# Patient Record
Sex: Female | Born: 1975 | Race: White | Hispanic: No | Marital: Married | Smoking: Current every day smoker
Health system: Southern US, Community
[De-identification: ages and names within clinical notes are randomized; demographics above are authoritative.]

## PROBLEM LIST (undated history)

## (undated) ENCOUNTER — Inpatient Hospital Stay (HOSPITAL_COMMUNITY): Payer: Self-pay

## (undated) DIAGNOSIS — F32A Depression, unspecified: Secondary | ICD-10-CM

## (undated) DIAGNOSIS — G8929 Other chronic pain: Secondary | ICD-10-CM

## (undated) DIAGNOSIS — G43909 Migraine, unspecified, not intractable, without status migrainosus: Secondary | ICD-10-CM

## (undated) DIAGNOSIS — R109 Unspecified abdominal pain: Secondary | ICD-10-CM

## (undated) DIAGNOSIS — F329 Major depressive disorder, single episode, unspecified: Secondary | ICD-10-CM

## (undated) DIAGNOSIS — E785 Hyperlipidemia, unspecified: Secondary | ICD-10-CM

## (undated) DIAGNOSIS — K219 Gastro-esophageal reflux disease without esophagitis: Secondary | ICD-10-CM

## (undated) DIAGNOSIS — I200 Unstable angina: Secondary | ICD-10-CM

## (undated) DIAGNOSIS — I10 Essential (primary) hypertension: Secondary | ICD-10-CM

## (undated) HISTORY — PX: WISDOM TOOTH EXTRACTION: SHX21

## (undated) HISTORY — PX: CHOLECYSTECTOMY: SHX55

---

## 2001-10-29 ENCOUNTER — Inpatient Hospital Stay (HOSPITAL_COMMUNITY): Admission: EM | Admit: 2001-10-29 | Discharge: 2001-11-02 | Payer: Self-pay | Admitting: Psychiatry

## 2002-10-19 ENCOUNTER — Emergency Department (HOSPITAL_COMMUNITY): Admission: EM | Admit: 2002-10-19 | Discharge: 2002-10-19 | Payer: Self-pay | Admitting: Emergency Medicine

## 2002-11-17 ENCOUNTER — Emergency Department (HOSPITAL_COMMUNITY): Admission: EM | Admit: 2002-11-17 | Discharge: 2002-11-17 | Payer: Self-pay | Admitting: Emergency Medicine

## 2002-11-17 ENCOUNTER — Encounter: Payer: Self-pay | Admitting: Emergency Medicine

## 2002-12-08 ENCOUNTER — Emergency Department (HOSPITAL_COMMUNITY): Admission: EM | Admit: 2002-12-08 | Discharge: 2002-12-08 | Payer: Self-pay | Admitting: Emergency Medicine

## 2002-12-08 ENCOUNTER — Encounter: Payer: Self-pay | Admitting: Emergency Medicine

## 2003-01-01 ENCOUNTER — Emergency Department (HOSPITAL_COMMUNITY): Admission: EM | Admit: 2003-01-01 | Discharge: 2003-01-02 | Payer: Self-pay

## 2003-01-02 ENCOUNTER — Emergency Department (HOSPITAL_COMMUNITY): Admission: EM | Admit: 2003-01-02 | Discharge: 2003-01-02 | Payer: Self-pay

## 2003-01-05 ENCOUNTER — Emergency Department (HOSPITAL_COMMUNITY): Admission: EM | Admit: 2003-01-05 | Discharge: 2003-01-06 | Payer: Self-pay | Admitting: Emergency Medicine

## 2003-01-09 ENCOUNTER — Emergency Department (HOSPITAL_COMMUNITY): Admission: EM | Admit: 2003-01-09 | Discharge: 2003-01-09 | Payer: Self-pay | Admitting: Emergency Medicine

## 2003-01-31 ENCOUNTER — Emergency Department (HOSPITAL_COMMUNITY): Admission: EM | Admit: 2003-01-31 | Discharge: 2003-01-31 | Payer: Self-pay | Admitting: Emergency Medicine

## 2003-03-01 ENCOUNTER — Emergency Department (HOSPITAL_COMMUNITY): Admission: EM | Admit: 2003-03-01 | Discharge: 2003-03-01 | Payer: Self-pay | Admitting: Emergency Medicine

## 2003-03-01 ENCOUNTER — Emergency Department (HOSPITAL_COMMUNITY): Admission: EM | Admit: 2003-03-01 | Discharge: 2003-03-02 | Payer: Self-pay | Admitting: Emergency Medicine

## 2003-03-01 ENCOUNTER — Encounter: Payer: Self-pay | Admitting: Emergency Medicine

## 2003-03-03 ENCOUNTER — Emergency Department (HOSPITAL_COMMUNITY): Admission: EM | Admit: 2003-03-03 | Discharge: 2003-03-03 | Payer: Self-pay | Admitting: Emergency Medicine

## 2003-07-03 ENCOUNTER — Emergency Department (HOSPITAL_COMMUNITY): Admission: EM | Admit: 2003-07-03 | Discharge: 2003-07-04 | Payer: Self-pay | Admitting: Emergency Medicine

## 2003-07-15 ENCOUNTER — Encounter: Payer: Self-pay | Admitting: Emergency Medicine

## 2003-07-15 ENCOUNTER — Emergency Department (HOSPITAL_COMMUNITY): Admission: EM | Admit: 2003-07-15 | Discharge: 2003-07-15 | Payer: Self-pay | Admitting: Emergency Medicine

## 2003-10-15 ENCOUNTER — Inpatient Hospital Stay (HOSPITAL_COMMUNITY): Admission: AD | Admit: 2003-10-15 | Discharge: 2003-10-15 | Payer: Self-pay | Admitting: *Deleted

## 2003-10-16 ENCOUNTER — Emergency Department (HOSPITAL_COMMUNITY): Admission: EM | Admit: 2003-10-16 | Discharge: 2003-10-16 | Payer: Self-pay

## 2003-10-17 ENCOUNTER — Other Ambulatory Visit: Admission: RE | Admit: 2003-10-17 | Discharge: 2003-10-17 | Payer: Self-pay | Admitting: *Deleted

## 2003-10-17 ENCOUNTER — Observation Stay (HOSPITAL_COMMUNITY): Admission: AD | Admit: 2003-10-17 | Discharge: 2003-10-18 | Payer: Self-pay | Admitting: General Surgery

## 2003-11-25 ENCOUNTER — Emergency Department (HOSPITAL_COMMUNITY): Admission: EM | Admit: 2003-11-25 | Discharge: 2003-11-25 | Payer: Self-pay | Admitting: Emergency Medicine

## 2003-12-04 ENCOUNTER — Emergency Department (HOSPITAL_COMMUNITY): Admission: EM | Admit: 2003-12-04 | Discharge: 2003-12-04 | Payer: Self-pay | Admitting: Emergency Medicine

## 2004-05-12 ENCOUNTER — Emergency Department (HOSPITAL_COMMUNITY): Admission: EM | Admit: 2004-05-12 | Discharge: 2004-05-12 | Payer: Self-pay | Admitting: Emergency Medicine

## 2004-06-08 ENCOUNTER — Emergency Department (HOSPITAL_COMMUNITY): Admission: EM | Admit: 2004-06-08 | Discharge: 2004-06-09 | Payer: Self-pay | Admitting: Emergency Medicine

## 2004-10-26 ENCOUNTER — Emergency Department (HOSPITAL_COMMUNITY): Admission: EM | Admit: 2004-10-26 | Discharge: 2004-10-26 | Payer: Self-pay | Admitting: Emergency Medicine

## 2005-01-01 ENCOUNTER — Encounter: Admission: RE | Admit: 2005-01-01 | Discharge: 2005-01-01 | Payer: Self-pay | Admitting: Obstetrics and Gynecology

## 2006-01-23 ENCOUNTER — Emergency Department (HOSPITAL_COMMUNITY): Admission: EM | Admit: 2006-01-23 | Discharge: 2006-01-23 | Payer: Self-pay | Admitting: Emergency Medicine

## 2006-04-04 ENCOUNTER — Emergency Department (HOSPITAL_COMMUNITY): Admission: EM | Admit: 2006-04-04 | Discharge: 2006-04-04 | Payer: Self-pay | Admitting: Emergency Medicine

## 2006-04-23 ENCOUNTER — Inpatient Hospital Stay (HOSPITAL_COMMUNITY): Admission: AD | Admit: 2006-04-23 | Discharge: 2006-05-02 | Payer: Self-pay | Admitting: Psychiatry

## 2006-04-23 ENCOUNTER — Ambulatory Visit: Payer: Self-pay | Admitting: Psychiatry

## 2006-04-30 ENCOUNTER — Encounter: Payer: Self-pay | Admitting: *Deleted

## 2006-05-02 ENCOUNTER — Emergency Department (HOSPITAL_COMMUNITY): Admission: EM | Admit: 2006-05-02 | Discharge: 2006-05-03 | Payer: Self-pay | Admitting: Emergency Medicine

## 2006-05-08 ENCOUNTER — Emergency Department (HOSPITAL_COMMUNITY): Admission: EM | Admit: 2006-05-08 | Discharge: 2006-05-08 | Payer: Self-pay | Admitting: Emergency Medicine

## 2006-05-15 ENCOUNTER — Inpatient Hospital Stay (HOSPITAL_COMMUNITY): Admission: AD | Admit: 2006-05-15 | Discharge: 2006-05-18 | Payer: Self-pay | Admitting: Psychiatry

## 2006-05-19 ENCOUNTER — Emergency Department (HOSPITAL_COMMUNITY): Admission: EM | Admit: 2006-05-19 | Discharge: 2006-05-19 | Payer: Self-pay | Admitting: Emergency Medicine

## 2006-05-29 ENCOUNTER — Emergency Department (HOSPITAL_COMMUNITY): Admission: EM | Admit: 2006-05-29 | Discharge: 2006-05-29 | Payer: Self-pay | Admitting: Family Medicine

## 2006-06-01 ENCOUNTER — Ambulatory Visit: Payer: Self-pay | Admitting: Nurse Practitioner

## 2006-06-06 ENCOUNTER — Ambulatory Visit: Payer: Self-pay | Admitting: *Deleted

## 2006-06-23 ENCOUNTER — Emergency Department (HOSPITAL_COMMUNITY): Admission: EM | Admit: 2006-06-23 | Discharge: 2006-06-23 | Payer: Self-pay | Admitting: Family Medicine

## 2006-06-27 ENCOUNTER — Emergency Department (HOSPITAL_COMMUNITY): Admission: EM | Admit: 2006-06-27 | Discharge: 2006-06-28 | Payer: Self-pay | Admitting: Emergency Medicine

## 2006-06-28 ENCOUNTER — Emergency Department (HOSPITAL_COMMUNITY): Admission: EM | Admit: 2006-06-28 | Discharge: 2006-06-29 | Payer: Self-pay | Admitting: Emergency Medicine

## 2006-07-01 ENCOUNTER — Emergency Department (HOSPITAL_COMMUNITY): Admission: EM | Admit: 2006-07-01 | Discharge: 2006-07-01 | Payer: Self-pay | Admitting: Emergency Medicine

## 2006-07-08 ENCOUNTER — Emergency Department (HOSPITAL_COMMUNITY): Admission: EM | Admit: 2006-07-08 | Discharge: 2006-07-08 | Payer: Self-pay | Admitting: *Deleted

## 2006-07-29 ENCOUNTER — Emergency Department (HOSPITAL_COMMUNITY): Admission: EM | Admit: 2006-07-29 | Discharge: 2006-07-29 | Payer: Self-pay | Admitting: Emergency Medicine

## 2006-08-03 ENCOUNTER — Ambulatory Visit: Payer: Self-pay | Admitting: Nurse Practitioner

## 2006-08-23 ENCOUNTER — Ambulatory Visit: Payer: Self-pay | Admitting: Nurse Practitioner

## 2006-09-10 ENCOUNTER — Emergency Department (HOSPITAL_COMMUNITY): Admission: EM | Admit: 2006-09-10 | Discharge: 2006-09-10 | Payer: Self-pay | Admitting: Emergency Medicine

## 2006-11-06 ENCOUNTER — Emergency Department (HOSPITAL_COMMUNITY): Admission: EM | Admit: 2006-11-06 | Discharge: 2006-11-06 | Payer: Self-pay | Admitting: Emergency Medicine

## 2006-11-09 ENCOUNTER — Emergency Department (HOSPITAL_COMMUNITY): Admission: EM | Admit: 2006-11-09 | Discharge: 2006-11-09 | Payer: Self-pay | Admitting: Emergency Medicine

## 2006-11-22 ENCOUNTER — Ambulatory Visit: Payer: Self-pay | Admitting: Family Medicine

## 2006-12-20 ENCOUNTER — Ambulatory Visit: Payer: Self-pay | Admitting: Family Medicine

## 2007-01-06 ENCOUNTER — Ambulatory Visit: Payer: Self-pay | Admitting: Family Medicine

## 2007-03-02 ENCOUNTER — Ambulatory Visit: Payer: Self-pay | Admitting: Family Medicine

## 2007-04-17 ENCOUNTER — Emergency Department (HOSPITAL_COMMUNITY): Admission: EM | Admit: 2007-04-17 | Discharge: 2007-04-17 | Payer: Self-pay | Admitting: Emergency Medicine

## 2007-06-07 ENCOUNTER — Encounter (INDEPENDENT_AMBULATORY_CARE_PROVIDER_SITE_OTHER): Payer: Self-pay | Admitting: *Deleted

## 2007-06-22 ENCOUNTER — Ambulatory Visit: Payer: Self-pay | Admitting: Family Medicine

## 2007-06-25 ENCOUNTER — Emergency Department (HOSPITAL_COMMUNITY): Admission: EM | Admit: 2007-06-25 | Discharge: 2007-06-25 | Payer: Self-pay | Admitting: Emergency Medicine

## 2007-06-30 ENCOUNTER — Emergency Department: Payer: Self-pay | Admitting: Emergency Medicine

## 2007-08-19 ENCOUNTER — Other Ambulatory Visit: Payer: Self-pay

## 2007-08-19 ENCOUNTER — Emergency Department: Payer: Self-pay | Admitting: Emergency Medicine

## 2007-09-02 ENCOUNTER — Emergency Department (HOSPITAL_COMMUNITY): Admission: EM | Admit: 2007-09-02 | Discharge: 2007-09-02 | Payer: Self-pay | Admitting: Emergency Medicine

## 2007-09-23 ENCOUNTER — Emergency Department: Payer: Self-pay | Admitting: Emergency Medicine

## 2007-10-17 ENCOUNTER — Emergency Department (HOSPITAL_COMMUNITY): Admission: EM | Admit: 2007-10-17 | Discharge: 2007-10-17 | Payer: Self-pay | Admitting: Family Medicine

## 2007-10-31 ENCOUNTER — Ambulatory Visit: Payer: Self-pay | Admitting: Family Medicine

## 2007-10-31 ENCOUNTER — Emergency Department (HOSPITAL_COMMUNITY): Admission: EM | Admit: 2007-10-31 | Discharge: 2007-10-31 | Payer: Self-pay | Admitting: Emergency Medicine

## 2007-12-18 ENCOUNTER — Emergency Department (HOSPITAL_COMMUNITY): Admission: EM | Admit: 2007-12-18 | Discharge: 2007-12-19 | Payer: Self-pay | Admitting: Emergency Medicine

## 2007-12-23 ENCOUNTER — Inpatient Hospital Stay (HOSPITAL_COMMUNITY): Admission: EM | Admit: 2007-12-23 | Discharge: 2007-12-25 | Payer: Self-pay | Admitting: Emergency Medicine

## 2008-01-08 ENCOUNTER — Emergency Department (HOSPITAL_COMMUNITY): Admission: EM | Admit: 2008-01-08 | Discharge: 2008-01-08 | Payer: Self-pay | Admitting: Emergency Medicine

## 2008-02-08 ENCOUNTER — Encounter (INDEPENDENT_AMBULATORY_CARE_PROVIDER_SITE_OTHER): Payer: Self-pay | Admitting: Family Medicine

## 2008-02-08 ENCOUNTER — Ambulatory Visit: Payer: Self-pay | Admitting: Internal Medicine

## 2008-04-14 ENCOUNTER — Emergency Department (HOSPITAL_COMMUNITY): Admission: EM | Admit: 2008-04-14 | Discharge: 2008-04-15 | Payer: Self-pay | Admitting: Emergency Medicine

## 2008-05-07 ENCOUNTER — Emergency Department (HOSPITAL_COMMUNITY): Admission: EM | Admit: 2008-05-07 | Discharge: 2008-05-07 | Payer: Self-pay | Admitting: Family Medicine

## 2008-05-24 ENCOUNTER — Emergency Department (HOSPITAL_COMMUNITY): Admission: EM | Admit: 2008-05-24 | Discharge: 2008-05-24 | Payer: Self-pay | Admitting: Family Medicine

## 2008-05-24 ENCOUNTER — Emergency Department (HOSPITAL_COMMUNITY): Admission: EM | Admit: 2008-05-24 | Discharge: 2008-05-24 | Payer: Self-pay | Admitting: Emergency Medicine

## 2008-07-12 ENCOUNTER — Emergency Department (HOSPITAL_COMMUNITY): Admission: EM | Admit: 2008-07-12 | Discharge: 2008-07-12 | Payer: Self-pay | Admitting: Emergency Medicine

## 2008-07-29 ENCOUNTER — Ambulatory Visit: Payer: Self-pay | Admitting: Internal Medicine

## 2008-08-14 ENCOUNTER — Emergency Department (HOSPITAL_COMMUNITY): Admission: EM | Admit: 2008-08-14 | Discharge: 2008-08-14 | Payer: Self-pay | Admitting: Emergency Medicine

## 2008-08-28 ENCOUNTER — Ambulatory Visit: Payer: Self-pay | Admitting: Internal Medicine

## 2008-09-20 ENCOUNTER — Emergency Department (HOSPITAL_COMMUNITY): Admission: EM | Admit: 2008-09-20 | Discharge: 2008-09-20 | Payer: Self-pay | Admitting: Family Medicine

## 2008-10-11 ENCOUNTER — Emergency Department (HOSPITAL_COMMUNITY): Admission: EM | Admit: 2008-10-11 | Discharge: 2008-10-12 | Payer: Self-pay | Admitting: Emergency Medicine

## 2008-10-30 ENCOUNTER — Emergency Department (HOSPITAL_COMMUNITY): Admission: EM | Admit: 2008-10-30 | Discharge: 2008-10-30 | Payer: Self-pay | Admitting: Emergency Medicine

## 2009-01-01 ENCOUNTER — Emergency Department (HOSPITAL_COMMUNITY): Admission: EM | Admit: 2009-01-01 | Discharge: 2009-01-01 | Payer: Self-pay | Admitting: Family Medicine

## 2009-01-02 ENCOUNTER — Emergency Department (HOSPITAL_COMMUNITY): Admission: EM | Admit: 2009-01-02 | Discharge: 2009-01-02 | Payer: Self-pay | Admitting: Emergency Medicine

## 2009-02-11 ENCOUNTER — Emergency Department (HOSPITAL_COMMUNITY): Admission: EM | Admit: 2009-02-11 | Discharge: 2009-02-12 | Payer: Self-pay | Admitting: Emergency Medicine

## 2009-02-12 ENCOUNTER — Emergency Department (HOSPITAL_COMMUNITY): Admission: EM | Admit: 2009-02-12 | Discharge: 2009-02-12 | Payer: Self-pay | Admitting: Emergency Medicine

## 2009-02-19 ENCOUNTER — Emergency Department (HOSPITAL_COMMUNITY): Admission: EM | Admit: 2009-02-19 | Discharge: 2009-02-19 | Payer: Self-pay | Admitting: Emergency Medicine

## 2009-02-21 ENCOUNTER — Emergency Department (HOSPITAL_COMMUNITY): Admission: EM | Admit: 2009-02-21 | Discharge: 2009-02-21 | Payer: Self-pay | Admitting: Emergency Medicine

## 2009-03-06 ENCOUNTER — Ambulatory Visit: Payer: Self-pay | Admitting: Internal Medicine

## 2009-03-07 ENCOUNTER — Ambulatory Visit: Payer: Self-pay | Admitting: Internal Medicine

## 2009-03-10 ENCOUNTER — Emergency Department (HOSPITAL_COMMUNITY): Admission: EM | Admit: 2009-03-10 | Discharge: 2009-03-10 | Payer: Self-pay | Admitting: Family Medicine

## 2009-03-13 ENCOUNTER — Ambulatory Visit: Payer: Self-pay | Admitting: Family Medicine

## 2009-03-27 ENCOUNTER — Ambulatory Visit: Payer: Self-pay | Admitting: Internal Medicine

## 2009-04-26 ENCOUNTER — Emergency Department (HOSPITAL_COMMUNITY): Admission: EM | Admit: 2009-04-26 | Discharge: 2009-04-26 | Payer: Self-pay | Admitting: Emergency Medicine

## 2009-04-27 ENCOUNTER — Emergency Department (HOSPITAL_COMMUNITY): Admission: EM | Admit: 2009-04-27 | Discharge: 2009-04-28 | Payer: Self-pay | Admitting: Emergency Medicine

## 2009-04-29 ENCOUNTER — Emergency Department (HOSPITAL_COMMUNITY): Admission: EM | Admit: 2009-04-29 | Discharge: 2009-04-29 | Payer: Self-pay | Admitting: Emergency Medicine

## 2009-05-11 ENCOUNTER — Emergency Department (HOSPITAL_COMMUNITY): Admission: EM | Admit: 2009-05-11 | Discharge: 2009-05-12 | Payer: Self-pay | Admitting: Emergency Medicine

## 2009-05-24 ENCOUNTER — Emergency Department (HOSPITAL_COMMUNITY): Admission: EM | Admit: 2009-05-24 | Discharge: 2009-05-24 | Payer: Self-pay | Admitting: Emergency Medicine

## 2009-05-26 ENCOUNTER — Emergency Department (HOSPITAL_COMMUNITY): Admission: EM | Admit: 2009-05-26 | Discharge: 2009-05-26 | Payer: Self-pay | Admitting: Emergency Medicine

## 2009-05-28 ENCOUNTER — Telehealth (INDEPENDENT_AMBULATORY_CARE_PROVIDER_SITE_OTHER): Payer: Self-pay | Admitting: *Deleted

## 2009-05-28 ENCOUNTER — Ambulatory Visit: Payer: Self-pay | Admitting: Internal Medicine

## 2009-05-28 ENCOUNTER — Emergency Department (HOSPITAL_COMMUNITY): Admission: EM | Admit: 2009-05-28 | Discharge: 2009-05-28 | Payer: Self-pay | Admitting: Emergency Medicine

## 2009-06-02 ENCOUNTER — Encounter (INDEPENDENT_AMBULATORY_CARE_PROVIDER_SITE_OTHER): Payer: Self-pay | Admitting: Family Medicine

## 2009-06-02 ENCOUNTER — Ambulatory Visit: Payer: Self-pay | Admitting: Internal Medicine

## 2009-06-02 LAB — CONVERTED CEMR LAB
AST: 12 units/L (ref 0–37)
Albumin: 4.7 g/dL (ref 3.5–5.2)
BUN: 14 mg/dL (ref 6–23)
Benzodiazepines.: NEGATIVE
Calcium: 10 mg/dL (ref 8.4–10.5)
Chloride: 98 meq/L (ref 96–112)
Creatinine,U: 103.7 mg/dL
Eosinophils Relative: 2 % (ref 0–5)
Glucose, Bld: 150 mg/dL — ABNORMAL HIGH (ref 70–99)
HCT: 45.3 % (ref 36.0–46.0)
Hemoglobin: 14.6 g/dL (ref 12.0–15.0)
Lymphocytes Relative: 37 % (ref 12–46)
Lymphs Abs: 5.1 10*3/uL — ABNORMAL HIGH (ref 0.7–4.0)
Methadone: NEGATIVE
Monocytes Absolute: 0.9 10*3/uL (ref 0.1–1.0)
Opiate Screen, Urine: NEGATIVE
Phencyclidine (PCP): NEGATIVE
Potassium: 4.7 meq/L (ref 3.5–5.3)
Propoxyphene: NEGATIVE
Sodium: 137 meq/L (ref 135–145)
Total Protein: 7.8 g/dL (ref 6.0–8.3)

## 2009-06-03 ENCOUNTER — Encounter (INDEPENDENT_AMBULATORY_CARE_PROVIDER_SITE_OTHER): Payer: Self-pay | Admitting: Internal Medicine

## 2009-06-16 ENCOUNTER — Emergency Department (HOSPITAL_COMMUNITY): Admission: EM | Admit: 2009-06-16 | Discharge: 2009-06-17 | Payer: Self-pay | Admitting: Emergency Medicine

## 2009-06-19 ENCOUNTER — Telehealth (INDEPENDENT_AMBULATORY_CARE_PROVIDER_SITE_OTHER): Payer: Self-pay | Admitting: *Deleted

## 2009-07-13 ENCOUNTER — Emergency Department (HOSPITAL_COMMUNITY): Admission: EM | Admit: 2009-07-13 | Discharge: 2009-07-13 | Payer: Self-pay | Admitting: Emergency Medicine

## 2009-07-29 ENCOUNTER — Ambulatory Visit: Payer: Self-pay | Admitting: Internal Medicine

## 2009-07-29 ENCOUNTER — Telehealth (INDEPENDENT_AMBULATORY_CARE_PROVIDER_SITE_OTHER): Payer: Self-pay | Admitting: *Deleted

## 2009-08-04 ENCOUNTER — Emergency Department (HOSPITAL_COMMUNITY): Admission: EM | Admit: 2009-08-04 | Discharge: 2009-08-04 | Payer: Self-pay | Admitting: Emergency Medicine

## 2009-09-09 ENCOUNTER — Observation Stay (HOSPITAL_COMMUNITY): Admission: EM | Admit: 2009-09-09 | Discharge: 2009-09-10 | Payer: Self-pay | Admitting: Emergency Medicine

## 2009-10-04 ENCOUNTER — Emergency Department (HOSPITAL_COMMUNITY): Admission: EM | Admit: 2009-10-04 | Discharge: 2009-10-04 | Payer: Self-pay | Admitting: Emergency Medicine

## 2009-10-17 ENCOUNTER — Emergency Department (HOSPITAL_COMMUNITY): Admission: EM | Admit: 2009-10-17 | Discharge: 2009-10-17 | Payer: Self-pay | Admitting: Family Medicine

## 2009-10-27 ENCOUNTER — Emergency Department (HOSPITAL_COMMUNITY): Admission: EM | Admit: 2009-10-27 | Discharge: 2009-10-27 | Payer: Self-pay | Admitting: Emergency Medicine

## 2009-11-05 ENCOUNTER — Emergency Department (HOSPITAL_COMMUNITY): Admission: EM | Admit: 2009-11-05 | Discharge: 2009-11-06 | Payer: Self-pay | Admitting: Emergency Medicine

## 2009-12-02 ENCOUNTER — Emergency Department (HOSPITAL_COMMUNITY): Admission: EM | Admit: 2009-12-02 | Discharge: 2009-12-03 | Payer: Self-pay | Admitting: Emergency Medicine

## 2009-12-13 ENCOUNTER — Emergency Department (HOSPITAL_COMMUNITY): Admission: EM | Admit: 2009-12-13 | Discharge: 2009-12-13 | Payer: Self-pay | Admitting: Emergency Medicine

## 2009-12-30 ENCOUNTER — Emergency Department (HOSPITAL_COMMUNITY): Admission: EM | Admit: 2009-12-30 | Discharge: 2009-12-30 | Payer: Self-pay | Admitting: Emergency Medicine

## 2010-01-06 ENCOUNTER — Emergency Department (HOSPITAL_COMMUNITY): Admission: EM | Admit: 2010-01-06 | Discharge: 2010-01-06 | Payer: Self-pay | Admitting: Family Medicine

## 2010-01-15 ENCOUNTER — Emergency Department (HOSPITAL_COMMUNITY): Admission: EM | Admit: 2010-01-15 | Discharge: 2010-01-15 | Payer: Self-pay | Admitting: Emergency Medicine

## 2010-01-29 ENCOUNTER — Emergency Department (HOSPITAL_COMMUNITY): Admission: EM | Admit: 2010-01-29 | Discharge: 2010-01-29 | Payer: Self-pay | Admitting: Emergency Medicine

## 2010-01-31 ENCOUNTER — Emergency Department (HOSPITAL_COMMUNITY): Admission: EM | Admit: 2010-01-31 | Discharge: 2010-02-01 | Payer: Self-pay | Admitting: Emergency Medicine

## 2010-02-02 ENCOUNTER — Emergency Department (HOSPITAL_COMMUNITY): Admission: EM | Admit: 2010-02-02 | Discharge: 2010-02-03 | Payer: Self-pay | Admitting: Emergency Medicine

## 2010-02-02 ENCOUNTER — Emergency Department (HOSPITAL_COMMUNITY): Admission: EM | Admit: 2010-02-02 | Discharge: 2010-02-02 | Payer: Self-pay | Admitting: Emergency Medicine

## 2010-02-03 ENCOUNTER — Emergency Department (HOSPITAL_COMMUNITY): Admission: EM | Admit: 2010-02-03 | Discharge: 2010-02-03 | Payer: Self-pay | Admitting: Emergency Medicine

## 2010-02-03 ENCOUNTER — Emergency Department: Payer: Self-pay | Admitting: Emergency Medicine

## 2010-02-06 ENCOUNTER — Ambulatory Visit: Payer: Self-pay | Admitting: Internal Medicine

## 2010-02-28 ENCOUNTER — Ambulatory Visit: Payer: Self-pay | Admitting: Diagnostic Radiology

## 2010-02-28 ENCOUNTER — Emergency Department (HOSPITAL_BASED_OUTPATIENT_CLINIC_OR_DEPARTMENT_OTHER): Admission: EM | Admit: 2010-02-28 | Discharge: 2010-02-28 | Payer: Self-pay | Admitting: Emergency Medicine

## 2010-03-02 ENCOUNTER — Ambulatory Visit: Payer: Self-pay | Admitting: Internal Medicine

## 2010-03-23 ENCOUNTER — Ambulatory Visit (HOSPITAL_BASED_OUTPATIENT_CLINIC_OR_DEPARTMENT_OTHER): Admission: RE | Admit: 2010-03-23 | Discharge: 2010-03-23 | Payer: Self-pay | Admitting: Internal Medicine

## 2010-03-28 ENCOUNTER — Ambulatory Visit: Payer: Self-pay | Admitting: Internal Medicine

## 2010-04-01 ENCOUNTER — Emergency Department (HOSPITAL_COMMUNITY): Admission: EM | Admit: 2010-04-01 | Discharge: 2010-04-02 | Payer: Self-pay | Admitting: Emergency Medicine

## 2010-04-13 ENCOUNTER — Emergency Department (HOSPITAL_COMMUNITY): Admission: EM | Admit: 2010-04-13 | Discharge: 2010-04-13 | Payer: Self-pay | Admitting: Emergency Medicine

## 2010-05-01 ENCOUNTER — Ambulatory Visit: Payer: Self-pay | Admitting: Internal Medicine

## 2010-05-01 LAB — CONVERTED CEMR LAB
Cholesterol: 247 mg/dL — ABNORMAL HIGH (ref 0–200)
Hgb A1c MFr Bld: 7.5 % — ABNORMAL HIGH (ref <5.7)
Total CHOL/HDL Ratio: 5.9
VLDL: 63 mg/dL — ABNORMAL HIGH (ref 0–40)

## 2010-05-08 ENCOUNTER — Ambulatory Visit: Payer: Self-pay | Admitting: Internal Medicine

## 2010-05-16 ENCOUNTER — Emergency Department (HOSPITAL_COMMUNITY): Admission: EM | Admit: 2010-05-16 | Discharge: 2010-05-17 | Payer: Self-pay | Admitting: Emergency Medicine

## 2010-05-19 ENCOUNTER — Emergency Department: Payer: Self-pay | Admitting: Emergency Medicine

## 2010-09-25 ENCOUNTER — Emergency Department (HOSPITAL_COMMUNITY)
Admission: EM | Admit: 2010-09-25 | Discharge: 2010-09-26 | Disposition: A | Discharge status: Other Acute Inpatient Hospital | Payer: Self-pay | Source: Home / Self Care | Admitting: Emergency Medicine

## 2010-09-26 ENCOUNTER — Inpatient Hospital Stay (HOSPITAL_COMMUNITY)
Admission: AD | Admit: 2010-09-26 | Discharge: 2010-10-12 | Payer: Self-pay | Attending: Psychiatry | Admitting: Psychiatry

## 2010-10-05 LAB — GLUCOSE, CAPILLARY
Glucose-Capillary: 212 mg/dL — ABNORMAL HIGH (ref 70–99)
Glucose-Capillary: 170 mg/dL — ABNORMAL HIGH (ref 70–99)
Glucose-Capillary: 185 mg/dL — ABNORMAL HIGH (ref 70–99)
Glucose-Capillary: 175 mg/dL — ABNORMAL HIGH (ref 70–99)
Glucose-Capillary: 213 mg/dL — ABNORMAL HIGH (ref 70–99)
Glucose-Capillary: 161 mg/dL — ABNORMAL HIGH (ref 70–99)
Glucose-Capillary: 171 mg/dL — ABNORMAL HIGH (ref 70–99)
Glucose-Capillary: 195 mg/dL — ABNORMAL HIGH (ref 70–99)
Glucose-Capillary: 194 mg/dL — ABNORMAL HIGH (ref 70–99)
Glucose-Capillary: 170 mg/dL — ABNORMAL HIGH (ref 70–99)
Glucose-Capillary: 176 mg/dL — ABNORMAL HIGH (ref 70–99)
Glucose-Capillary: 219 mg/dL — ABNORMAL HIGH (ref 70–99)
Glucose-Capillary: 219 mg/dL — ABNORMAL HIGH (ref 70–99)
Glucose-Capillary: 208 mg/dL — ABNORMAL HIGH (ref 70–99)
Glucose-Capillary: 197 mg/dL — ABNORMAL HIGH (ref 70–99)
Glucose-Capillary: 168 mg/dL — ABNORMAL HIGH (ref 70–99)
Glucose-Capillary: 222 mg/dL — ABNORMAL HIGH (ref 70–99)
Glucose-Capillary: 211 mg/dL — ABNORMAL HIGH (ref 70–99)
Glucose-Capillary: 205 mg/dL — ABNORMAL HIGH (ref 70–99)
Glucose-Capillary: 169 mg/dL — ABNORMAL HIGH (ref 70–99)
Glucose-Capillary: 171 mg/dL — ABNORMAL HIGH (ref 70–99)
Glucose-Capillary: 145 mg/dL — ABNORMAL HIGH (ref 70–99)

## 2010-10-05 LAB — DIFFERENTIAL
Basophils Absolute: 0 10*3/uL (ref 0.0–0.1)
Basophils Relative: 0 % (ref 0–1)
Eosinophils Absolute: 0.2 10*3/uL (ref 0.0–0.7)
Eosinophils Relative: 1 % (ref 0–5)
Lymphocytes Relative: 34 % (ref 12–46)
Lymphs Abs: 3.9 10*3/uL (ref 0.7–4.0)
Monocytes Absolute: 0.7 10*3/uL (ref 0.1–1.0)
Monocytes Relative: 6 % (ref 3–12)
Neutro Abs: 6.7 10*3/uL (ref 1.7–7.7)
Neutrophils Relative %: 58 % (ref 43–77)

## 2010-10-05 LAB — CBC
HCT: 45.3 % (ref 36.0–46.0)
Hemoglobin: 14.9 g/dL (ref 12.0–15.0)
MCH: 29.7 pg (ref 26.0–34.0)
MCHC: 32.9 g/dL (ref 30.0–36.0)
MCV: 90.4 fL (ref 78.0–100.0)
Platelets: 279 10*3/uL (ref 150–400)
RBC: 5.01 MIL/uL (ref 3.87–5.11)
RDW: 13.3 % (ref 11.5–15.5)
WBC: 11.4 10*3/uL — ABNORMAL HIGH (ref 4.0–10.5)

## 2010-10-05 LAB — BASIC METABOLIC PANEL
BUN: 10 mg/dL (ref 6–23)
CO2: 25 mEq/L (ref 19–32)
Calcium: 9.8 mg/dL (ref 8.4–10.5)
Chloride: 104 mEq/L (ref 96–112)
Creatinine, Ser: 1.02 mg/dL (ref 0.4–1.2)
GFR calc Af Amer: >60 mL/min (ref >60)
GFR calc non Af Amer: >60 mL/min (ref >60)
Glucose, Bld: 209 mg/dL — ABNORMAL HIGH (ref 70–99)
Potassium: 3.7 mEq/L (ref 3.5–5.1)
Sodium: 139 mEq/L (ref 135–145)

## 2010-10-05 LAB — RAPID URINE DRUG SCREEN, HOSP PERFORMED
Amphetamines: NOT DETECTED
Barbiturates: NOT DETECTED
Benzodiazepines: POSITIVE — AB
Cocaine: NOT DETECTED
Opiates: NOT DETECTED
Tetrahydrocannabinol: NOT DETECTED

## 2010-10-05 LAB — ETHANOL: Alcohol, Ethyl (B): <5 mg/dL (ref 0–10)

## 2010-10-05 LAB — TRICYCLICS SCREEN, URINE: TCA Scrn: NOT DETECTED

## 2010-10-07 LAB — URINALYSIS, ROUTINE W REFLEX MICROSCOPIC
Bilirubin Urine: NEGATIVE
Hgb urine dipstick: NEGATIVE
Ketones, ur: NEGATIVE mg/dL
Nitrite: NEGATIVE
Protein, ur: NEGATIVE mg/dL
Specific Gravity, Urine: 1.02 (ref 1.005–1.030)
Urine Glucose, Fasting: NEGATIVE mg/dL
Urobilinogen, UA: 0.2 mg/dL (ref 0.0–1.0)
pH: 5 (ref 5.0–8.0)

## 2010-10-07 LAB — GLUCOSE, CAPILLARY
Glucose-Capillary: 142 mg/dL — ABNORMAL HIGH (ref 70–99)
Glucose-Capillary: 132 mg/dL — ABNORMAL HIGH (ref 70–99)
Glucose-Capillary: 169 mg/dL — ABNORMAL HIGH (ref 70–99)
Glucose-Capillary: 131 mg/dL — ABNORMAL HIGH (ref 70–99)
Glucose-Capillary: 131 mg/dL — ABNORMAL HIGH (ref 70–99)
Glucose-Capillary: 147 mg/dL — ABNORMAL HIGH (ref 70–99)

## 2010-10-07 LAB — URINE MICROSCOPIC-ADD ON

## 2010-10-12 LAB — GLUCOSE, CAPILLARY
Glucose-Capillary: 128 mg/dL — ABNORMAL HIGH (ref 70–99)
Glucose-Capillary: 118 mg/dL — ABNORMAL HIGH (ref 70–99)
Glucose-Capillary: 127 mg/dL — ABNORMAL HIGH (ref 70–99)
Glucose-Capillary: 134 mg/dL — ABNORMAL HIGH (ref 70–99)

## 2010-10-12 LAB — GC/CHLAMYDIA PROBE AMP, URINE
Chlamydia, Swab/Urine, PCR: NEGATIVE
GC Probe Amp, Urine: NEGATIVE

## 2010-10-13 LAB — GLUCOSE, CAPILLARY
Glucose-Capillary: 143 mg/dL — ABNORMAL HIGH (ref 70–99)
Glucose-Capillary: 150 mg/dL — ABNORMAL HIGH (ref 70–99)
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
## 2010-12-03 LAB — DIFFERENTIAL
Basophils Absolute: 0 10*3/uL (ref 0.0–0.1)
Eosinophils Relative: 2 % (ref 0–5)
Lymphocytes Relative: 30 % (ref 12–46)
Lymphs Abs: 3.8 10*3/uL (ref 0.7–4.0)
Neutro Abs: 7.5 10*3/uL (ref 1.7–7.7)
Neutrophils Relative %: 60 % (ref 43–77)

## 2010-12-03 LAB — CBC
MCV: 91.2 fL (ref 78.0–100.0)
Platelets: 332 10*3/uL (ref 150–400)
RBC: 4.22 MIL/uL (ref 3.87–5.11)
RDW: 13.3 % (ref 11.5–15.5)
WBC: 12.6 10*3/uL — ABNORMAL HIGH (ref 4.0–10.5)

## 2010-12-05 LAB — CBC
Hemoglobin: 13 g/dL (ref 12.0–15.0)
MCH: 31 pg (ref 26.0–34.0)
MCHC: 34.3 g/dL (ref 30.0–36.0)
RDW: 13.2 % (ref 11.5–15.5)

## 2010-12-05 LAB — COMPREHENSIVE METABOLIC PANEL
AST: 20 U/L (ref 0–37)
CO2: 22 mEq/L (ref 19–32)
Calcium: 8.5 mg/dL (ref 8.4–10.5)
Creatinine, Ser: 0.77 mg/dL (ref 0.4–1.2)
GFR calc Af Amer: >60 mL/min (ref >60)
GFR calc non Af Amer: >60 mL/min (ref >60)
Glucose, Bld: 180 mg/dL — ABNORMAL HIGH (ref 70–99)
Total Protein: 6.7 g/dL (ref 6.0–8.3)

## 2010-12-05 LAB — DIFFERENTIAL
Lymphocytes Relative: 20 % (ref 12–46)
Lymphs Abs: 1.1 10*3/uL (ref 0.7–4.0)
Monocytes Relative: 8 % (ref 3–12)
Neutrophils Relative %: 69 % (ref 43–77)

## 2010-12-05 LAB — URINE CULTURE
Colony Count: NO GROWTH
Culture: NO GROWTH

## 2010-12-05 LAB — URINALYSIS, ROUTINE W REFLEX MICROSCOPIC
Nitrite: NEGATIVE
Specific Gravity, Urine: 1.03 (ref 1.005–1.030)
Urobilinogen, UA: 1 mg/dL (ref 0.0–1.0)
pH: 6 (ref 5.0–8.0)

## 2010-12-05 LAB — GLUCOSE, CAPILLARY: Glucose-Capillary: 190 mg/dL — ABNORMAL HIGH (ref 70–99)

## 2010-12-05 LAB — URINE MICROSCOPIC-ADD ON

## 2010-12-06 LAB — COMPREHENSIVE METABOLIC PANEL
AST: 21 U/L (ref 0–37)
BUN: 9 mg/dL (ref 6–23)
CO2: 25 mEq/L (ref 19–32)
Chloride: 103 mEq/L (ref 96–112)
Creatinine, Ser: 0.8 mg/dL (ref 0.4–1.2)
GFR calc non Af Amer: >60 mL/min (ref >60)
Total Bilirubin: 0.3 mg/dL (ref 0.3–1.2)

## 2010-12-06 LAB — URINALYSIS, ROUTINE W REFLEX MICROSCOPIC
Bilirubin Urine: NEGATIVE
Glucose, UA: NEGATIVE mg/dL
Hgb urine dipstick: NEGATIVE
Hgb urine dipstick: NEGATIVE
Ketones, ur: NEGATIVE mg/dL
Protein, ur: NEGATIVE mg/dL
Specific Gravity, Urine: 1.013 (ref 1.005–1.030)
Urobilinogen, UA: 0.2 mg/dL (ref 0.0–1.0)

## 2010-12-06 LAB — URINE MICROSCOPIC-ADD ON

## 2010-12-06 LAB — CBC
HCT: 40.5 % (ref 36.0–46.0)
HCT: 40.7 % (ref 36.0–46.0)
Hemoglobin: 13.5 g/dL (ref 12.0–15.0)
MCV: 91.8 fL (ref 78.0–100.0)
RBC: 4.43 MIL/uL (ref 3.87–5.11)
RBC: 4.43 MIL/uL (ref 3.87–5.11)
RDW: 14 % (ref 11.5–15.5)
WBC: 14.9 10*3/uL — ABNORMAL HIGH (ref 4.0–10.5)
WBC: 12.3 10*3/uL — ABNORMAL HIGH (ref 4.0–10.5)

## 2010-12-06 LAB — DIFFERENTIAL
Basophils Absolute: 0.2 10*3/uL — ABNORMAL HIGH (ref 0.0–0.1)
Basophils Absolute: 0 10*3/uL (ref 0.0–0.1)
Basophils Relative: 0 % (ref 0–1)
Eosinophils Relative: 2 % (ref 0–5)
Lymphocytes Relative: 31 % (ref 12–46)
Lymphocytes Relative: 33 % (ref 12–46)
Monocytes Absolute: 0.8 10*3/uL (ref 0.1–1.0)
Neutro Abs: 9 10*3/uL — ABNORMAL HIGH (ref 1.7–7.7)
Neutro Abs: 7.2 10*3/uL (ref 1.7–7.7)

## 2010-12-06 LAB — BASIC METABOLIC PANEL
BUN: 9 mg/dL (ref 6–23)
GFR calc Af Amer: >60 mL/min (ref >60)
GFR calc non Af Amer: >60 mL/min (ref >60)
Potassium: 5 mEq/L (ref 3.5–5.1)
Sodium: 134 mEq/L — ABNORMAL LOW (ref 135–145)

## 2010-12-06 LAB — POCT PREGNANCY, URINE: Preg Test, Ur: NEGATIVE

## 2010-12-06 LAB — HEPATIC FUNCTION PANEL
ALT: 27 U/L (ref 0–35)
AST: 36 U/L (ref 0–37)
Bilirubin, Direct: 0.5 mg/dL — ABNORMAL HIGH (ref 0.0–0.3)
Total Protein: 7.3 g/dL (ref 6.0–8.3)

## 2010-12-08 LAB — GLUCOSE, CAPILLARY: Glucose-Capillary: 161 mg/dL — ABNORMAL HIGH (ref 70–99)

## 2010-12-13 LAB — URINALYSIS, ROUTINE W REFLEX MICROSCOPIC
Bilirubin Urine: NEGATIVE
Bilirubin Urine: NEGATIVE
Glucose, UA: NEGATIVE mg/dL
Hgb urine dipstick: NEGATIVE
Ketones, ur: NEGATIVE mg/dL
Nitrite: NEGATIVE
Nitrite: NEGATIVE
Protein, ur: NEGATIVE mg/dL
Protein, ur: NEGATIVE mg/dL
Specific Gravity, Urine: 1.026 (ref 1.005–1.030)
Specific Gravity, Urine: 1.031 — ABNORMAL HIGH (ref 1.005–1.030)
Urobilinogen, UA: 0.2 mg/dL (ref 0.0–1.0)
Urobilinogen, UA: 0.2 mg/dL (ref 0.0–1.0)
pH: 5.5 (ref 5.0–8.0)

## 2010-12-13 LAB — COMPREHENSIVE METABOLIC PANEL WITH GFR
ALT: 20 U/L (ref 0–35)
AST: 16 U/L (ref 0–37)
Albumin: 3.8 g/dL (ref 3.5–5.2)
Alkaline Phosphatase: 77 U/L (ref 39–117)
Calcium: 9.2 mg/dL (ref 8.4–10.5)
GFR calc Af Amer: >60 mL/min (ref >60)
Glucose, Bld: 151 mg/dL — ABNORMAL HIGH (ref 70–99)
Potassium: 3.9 meq/L (ref 3.5–5.1)
Sodium: 135 meq/L (ref 135–145)
Total Protein: 7.3 g/dL (ref 6.0–8.3)

## 2010-12-13 LAB — POCT I-STAT, CHEM 8
HCT: 41 % (ref 36.0–46.0)
Hemoglobin: 13.9 g/dL (ref 12.0–15.0)
Potassium: 4 mEq/L (ref 3.5–5.1)
Sodium: 136 mEq/L (ref 135–145)
TCO2: 25 mmol/L (ref 0–100)

## 2010-12-13 LAB — LIPASE, BLOOD: Lipase: 23 U/L (ref 11–59)

## 2010-12-13 LAB — COMPREHENSIVE METABOLIC PANEL
BUN: 10 mg/dL (ref 6–23)
CO2: 25 mEq/L (ref 19–32)
Chloride: 103 mEq/L (ref 96–112)
Creatinine, Ser: 0.82 mg/dL (ref 0.4–1.2)
GFR calc non Af Amer: >60 mL/min (ref >60)
Total Bilirubin: 0.7 mg/dL (ref 0.3–1.2)

## 2010-12-13 LAB — POCT CARDIAC MARKERS
CKMB, poc: <1 ng/mL — ABNORMAL LOW (ref 1.0–8.0)
Myoglobin, poc: 42.9 ng/mL (ref 12–200)

## 2010-12-13 LAB — DIFFERENTIAL
Basophils Absolute: 0 10*3/uL (ref 0.0–0.1)
Basophils Absolute: 0 10*3/uL (ref 0.0–0.1)
Basophils Relative: 0 % (ref 0–1)
Basophils Relative: 0 % (ref 0–1)
Eosinophils Absolute: 0.3 10*3/uL (ref 0.0–0.7)
Eosinophils Absolute: 0.3 K/uL (ref 0.0–0.7)
Eosinophils Relative: 3 % (ref 0–5)
Eosinophils Relative: 3 % (ref 0–5)
Lymphocytes Relative: 15 % (ref 12–46)
Lymphs Abs: 2 K/uL (ref 0.7–4.0)
Lymphs Abs: 3.8 10*3/uL (ref 0.7–4.0)
Monocytes Absolute: 0.9 K/uL (ref 0.1–1.0)
Monocytes Relative: 7 % (ref 3–12)
Neutro Abs: 9.7 10*3/uL — ABNORMAL HIGH (ref 1.7–7.7)
Neutrophils Relative %: 45 % (ref 43–77)
Neutrophils Relative %: 75 % (ref 43–77)

## 2010-12-13 LAB — CBC
HCT: 37.3 % (ref 36.0–46.0)
Hemoglobin: 12.8 g/dL (ref 12.0–15.0)
MCHC: 32.3 g/dL (ref 30.0–36.0)
MCHC: 34.1 g/dL (ref 30.0–36.0)
MCV: 88.8 fL (ref 78.0–100.0)
MCV: 90.7 fL (ref 78.0–100.0)
Platelets: 242 K/uL (ref 150–400)
Platelets: 248 10*3/uL (ref 150–400)
RBC: 4.21 MIL/uL (ref 3.87–5.11)
RDW: 12.6 % (ref 11.5–15.5)
RDW: 13.3 % (ref 11.5–15.5)
WBC: 12.9 10*3/uL — ABNORMAL HIGH (ref 4.0–10.5)
WBC: 8.6 10*3/uL (ref 4.0–10.5)

## 2010-12-13 LAB — GLUCOSE, CAPILLARY
Glucose-Capillary: 160 mg/dL — ABNORMAL HIGH (ref 70–99)
Glucose-Capillary: 163 mg/dL — ABNORMAL HIGH (ref 70–99)

## 2010-12-13 LAB — URINE MICROSCOPIC-ADD ON

## 2010-12-21 LAB — POCT CARDIAC MARKERS
Myoglobin, poc: 60.1 ng/mL (ref 12–200)
Troponin i, poc: <0.05 ng/mL (ref 0.00–0.09)
Troponin i, poc: <0.05 ng/mL (ref 0.00–0.09)

## 2010-12-21 LAB — URINALYSIS, ROUTINE W REFLEX MICROSCOPIC
Bilirubin Urine: NEGATIVE
Ketones, ur: NEGATIVE mg/dL
Nitrite: NEGATIVE
Protein, ur: NEGATIVE mg/dL

## 2010-12-21 LAB — GLUCOSE, CAPILLARY
Glucose-Capillary: 137 mg/dL — ABNORMAL HIGH (ref 70–99)
Glucose-Capillary: 144 mg/dL — ABNORMAL HIGH (ref 70–99)
Glucose-Capillary: 198 mg/dL — ABNORMAL HIGH (ref 70–99)

## 2010-12-21 LAB — POCT I-STAT, CHEM 8
BUN: 20 mg/dL (ref 6–23)
Calcium, Ion: 1.14 mmol/L (ref 1.12–1.32)
Chloride: 103 mEq/L (ref 96–112)
Creatinine, Ser: 0.6 mg/dL (ref 0.4–1.2)
Glucose, Bld: 156 mg/dL — ABNORMAL HIGH (ref 70–99)
TCO2: 27 mmol/L (ref 0–100)

## 2010-12-21 LAB — LIPID PANEL
LDL Cholesterol: 125 mg/dL — ABNORMAL HIGH (ref 0–99)
Total CHOL/HDL Ratio: 5 RATIO
VLDL: 30 mg/dL (ref 0–40)

## 2010-12-21 LAB — DIFFERENTIAL
Eosinophils Relative: 2 % (ref 0–5)
Lymphocytes Relative: 20 % (ref 12–46)
Lymphs Abs: 3.1 10*3/uL (ref 0.7–4.0)
Monocytes Absolute: 1.1 10*3/uL — ABNORMAL HIGH (ref 0.1–1.0)
Monocytes Relative: 7 % (ref 3–12)

## 2010-12-21 LAB — CBC
HCT: 41.1 % (ref 36.0–46.0)
Hemoglobin: 13.9 g/dL (ref 12.0–15.0)
RBC: 4.43 MIL/uL (ref 3.87–5.11)
RDW: 13.1 % (ref 11.5–15.5)
WBC: 15.7 10*3/uL — ABNORMAL HIGH (ref 4.0–10.5)

## 2010-12-21 LAB — CARDIAC PANEL(CRET KIN+CKTOT+MB+TROPI)
CK, MB: 0.9 ng/mL (ref 0.3–4.0)
Relative Index: INVALID (ref 0.0–2.5)
Total CK: 49 U/L (ref 7–177)
Troponin I: 0.01 ng/mL (ref 0.00–0.06)
Troponin I: <0.01 ng/mL (ref 0.00–0.06)

## 2010-12-21 LAB — HEMOGLOBIN A1C: Hgb A1c MFr Bld: 6.2 % — ABNORMAL HIGH (ref 4.6–6.1)

## 2010-12-21 LAB — TROPONIN I: Troponin I: <0.01 ng/mL (ref 0.00–0.06)

## 2010-12-21 LAB — CK TOTAL AND CKMB (NOT AT ARMC): CK, MB: 0.9 ng/mL (ref 0.3–4.0)

## 2010-12-25 LAB — URINALYSIS, ROUTINE W REFLEX MICROSCOPIC
Glucose, UA: NEGATIVE mg/dL
Ketones, ur: NEGATIVE mg/dL
Specific Gravity, Urine: 1.015 (ref 1.005–1.030)
pH: 5.5 (ref 5.0–8.0)

## 2010-12-25 LAB — RAPID URINE DRUG SCREEN, HOSP PERFORMED
Barbiturates: NOT DETECTED
Benzodiazepines: POSITIVE — AB
Cocaine: NOT DETECTED
Opiates: NOT DETECTED

## 2010-12-26 LAB — DIFFERENTIAL
Basophils Absolute: 0.1 10*3/uL (ref 0.0–0.1)
Basophils Absolute: 0.1 10*3/uL (ref 0.0–0.1)
Basophils Relative: 0 % (ref 0–1)
Basophils Relative: 2 % — ABNORMAL HIGH (ref 0–1)
Eosinophils Absolute: 0.3 10*3/uL (ref 0.0–0.7)
Eosinophils Absolute: 0.2 10*3/uL (ref 0.0–0.7)
Eosinophils Relative: 2 % (ref 0–5)
Lymphocytes Relative: 32 % (ref 12–46)
Lymphs Abs: 2.9 10*3/uL (ref 0.7–4.0)
Lymphs Abs: 2.9 10*3/uL (ref 0.7–4.0)
Monocytes Absolute: 0.6 10*3/uL (ref 0.1–1.0)
Monocytes Relative: 7 % (ref 3–12)
Neutro Abs: 9.8 10*3/uL — ABNORMAL HIGH (ref 1.7–7.7)
Neutro Abs: 4.3 10*3/uL (ref 1.7–7.7)
Neutro Abs: 5.3 10*3/uL (ref 1.7–7.7)
Neutrophils Relative %: 64 % (ref 43–77)
Neutrophils Relative %: 53 % (ref 43–77)

## 2010-12-26 LAB — WET PREP, GENITAL
Clue Cells Wet Prep HPF POC: NONE SEEN
WBC, Wet Prep HPF POC: NONE SEEN

## 2010-12-26 LAB — URINALYSIS, ROUTINE W REFLEX MICROSCOPIC
Bilirubin Urine: NEGATIVE
Glucose, UA: NEGATIVE mg/dL
Glucose, UA: 500 mg/dL — AB
Hgb urine dipstick: NEGATIVE
Hgb urine dipstick: NEGATIVE
Ketones, ur: NEGATIVE mg/dL
Nitrite: NEGATIVE
Nitrite: NEGATIVE
Protein, ur: NEGATIVE mg/dL
Specific Gravity, Urine: 1.017 (ref 1.005–1.030)
Specific Gravity, Urine: 1.023 (ref 1.005–1.030)
Urobilinogen, UA: 0.2 mg/dL (ref 0.0–1.0)
Urobilinogen, UA: 0.2 mg/dL (ref 0.0–1.0)
pH: 6 (ref 5.0–8.0)
pH: 5.5 (ref 5.0–8.0)

## 2010-12-26 LAB — LIPASE, BLOOD
Lipase: 17 U/L (ref 11–59)
Lipase: 14 U/L (ref 11–59)
Lipase: 19 U/L (ref 11–59)

## 2010-12-26 LAB — COMPREHENSIVE METABOLIC PANEL
AST: 25 U/L (ref 0–37)
Albumin: 3.6 g/dL (ref 3.5–5.2)
Alkaline Phosphatase: 70 U/L (ref 39–117)
BUN: 12 mg/dL (ref 6–23)
BUN: 13 mg/dL (ref 6–23)
CO2: 29 mEq/L (ref 19–32)
Calcium: 9.4 mg/dL (ref 8.4–10.5)
Calcium: 9.3 mg/dL (ref 8.4–10.5)
Chloride: 104 mEq/L (ref 96–112)
Creatinine, Ser: 0.77 mg/dL (ref 0.4–1.2)
GFR calc Af Amer: >60 mL/min (ref >60)
GFR calc non Af Amer: >60 mL/min (ref >60)
Glucose, Bld: 148 mg/dL — ABNORMAL HIGH (ref 70–99)
Potassium: 3.8 mEq/L (ref 3.5–5.1)
Total Bilirubin: 0.9 mg/dL (ref 0.3–1.2)
Total Protein: 7.5 g/dL (ref 6.0–8.3)

## 2010-12-26 LAB — URINE MICROSCOPIC-ADD ON

## 2010-12-26 LAB — GLUCOSE, CAPILLARY: Glucose-Capillary: 156 mg/dL — ABNORMAL HIGH (ref 70–99)

## 2010-12-26 LAB — CBC
HCT: 38.7 % (ref 36.0–46.0)
HCT: 40.3 % (ref 36.0–46.0)
Hemoglobin: 13.1 g/dL (ref 12.0–15.0)
MCHC: 33.8 g/dL (ref 30.0–36.0)
MCHC: 32.7 g/dL (ref 30.0–36.0)
MCV: 90.5 fL (ref 78.0–100.0)
MCV: 90.7 fL (ref 78.0–100.0)
Platelets: 266 10*3/uL (ref 150–400)
Platelets: 284 10*3/uL (ref 150–400)
RBC: 4.24 MIL/uL (ref 3.87–5.11)
RDW: 13.5 % (ref 11.5–15.5)
RDW: 14 % (ref 11.5–15.5)
WBC: 8.1 10*3/uL (ref 4.0–10.5)

## 2010-12-26 LAB — POCT PREGNANCY, URINE: Preg Test, Ur: NEGATIVE

## 2010-12-26 LAB — URINE CULTURE

## 2010-12-30 LAB — URINALYSIS, ROUTINE W REFLEX MICROSCOPIC
Ketones, ur: 15 mg/dL — AB
Nitrite: NEGATIVE
Protein, ur: NEGATIVE mg/dL
pH: 5.5 (ref 5.0–8.0)

## 2011-01-04 LAB — URINALYSIS, ROUTINE W REFLEX MICROSCOPIC
Glucose, UA: NEGATIVE mg/dL
Hgb urine dipstick: NEGATIVE
Ketones, ur: NEGATIVE mg/dL
Protein, ur: NEGATIVE mg/dL

## 2011-02-02 NOTE — H&P (Signed)
NAMEKINNLEY, Brianna NO.:  [id number]   MEDICAL RECORD NO.:  [id number]          PATIENT TYPE:  INP   LOCATION:  1824                         FACILITY:  [facility]   PHYSICIAN:  Altha Harm, MDDATE OF BIRTH:  03/17/1976   DATE OF ADMISSION:  12/23/2007  DATE OF DISCHARGE:                              HISTORY & PHYSICAL   CHIEF COMPLAINT:  Chest pain and headache.   HISTORY OF PRESENT ILLNESS:  [name] is a 35 year old morbidly  obese female with a past medical history of hypertension, who presents  to the emergency room after a sudden onset of pain in the chest starting  last night.  The patient states that the pain appeared initially as a  sharp pain in the precordial area and also in the left lateral chest  wall.  She states that the pain lasted approximately one minute and then  abated on its own.  She states that the pain thereafter was replaced  with an intermittent pressure-like pain which lasted just a few seconds  and resolved.  The pain was nonradiating.  At its highest intensity, the  pain was an 8/10 and accompanied with shortness of breath.  The patient  states that she also had a headache prior to coming to the emergency  room which resolved on its own.  The patient has had no fevers or  chills.  She has had no nausea, vomiting, or diarrhea.  The patient  states that she is on Ziac for blood pressure management and has been  compliant with the medication.   PAST MEDICAL HISTORY:  1. Hypertension.  2. Allergic rhinitis.  3. Depression.  4. Cholecystectomy.   FAMILY HISTORY:  Significant for coronary artery disease and myocardial  infarction in her mother at age 56, which she survived.  Hypertension.   SOCIAL HISTORY:  Patient resides with her husband.  The patient has had  one child who is 79 years old.  She denies alcohol, tobacco, or drug  use.   CURRENT MEDICATIONS:  1. Over-the-counter Pepcid.  2. Ziac 5/12.5 mg p.o. daily.  3.  Zyrtec 10 mg p.o. p.r.n. allergic rhinitis.   ALLERGIES:  1. PENICILLIN.  2. SULFA.  3. FLUOROQUINALONES.  4. CODEINE.  5. SHELLFISH.  6. IMITREX.   PRIMARY CARE PHYSICIAN:  Sharlot Gowda, M.D.   REVIEW OF SYSTEMS:  The patient reports marked central obesity.  The  patient states that a few years ago, she was initially treated for  diabetes with Glucophage; however, after performing a hemoglobin A1C,  Dr. Susann Givens took her off the Glucophage.  Patient does have excessive  facial hair.  Her periods are irregular.   Studies in the emergency room show a white blood cell count of 13.3,  hemoglobin 16, hematocrit 47, platelet count 264.  Patient also has a  sodium of 138, potassium 4, chloride 107, BUN 9, creatinine 1, glucose  149.   PHYSICAL EXAMINATION:  The patient is lying in bed in no acute distress.  She is morbidly obese.  Current blood pressure is 152/94, heart rate 85, O2 sats 96% on room  air.  Respiratory rate is 18.  HEENT:  Patient is normocephalic and atraumatic.  Pupils are equal,  round and reactive to light and accommodation.  Fundi are benign.  Oropharynx is moist.  No exudate, erythema, or lesions noted.  Patient  has facial hair in a female-pattern distribution.  NECK:  Trachea is midline.  No masses, no thyromegaly, no JVD, no  carotid bruit.  RESPIRATORY:  Patient has normal respiratory effort, equal excursion  bilaterally.  No wheezing or rhonchi noted.  She has no chest  tenderness.  CARDIOVASCULAR:  She has a normal S1 and S2.  No murmurs, rubs or  gallops are noted.  PMI is nondisplaced.  No heaves or thrills on  palpation.  ABDOMEN:  Patient has a markedly obese abdomen with abdominal striae  present.  It is soft, nontender, nondistended.  No masses, no  hepatosplenomegaly.  LYMPH NODE SURVEY:  She has no cervical, axillary, or inguinal  lymphadenopathy noted.  NEUROLOGIC:  She has no focal neurological deficits.  MUSCULOSKELETAL:  No warmth, swelling,  or erythema around the joints.  No spinal tenderness noted.  PSYCHIATRIC:  Alert and oriented x3.  Good recent/remote recall.  Good  insight and cognition.   ASSESSMENT:  This is a patient who presents with:  1. Hypertensive urgency.  2. Chest pain.  3. Possible hormonal syndrome.   PLAN:  We will go ahead and cycle her enzymes.  Check a 2D  echocardiogram and a chest x-ray.  I will get a fasting lipid profile on  the patient and a TSH.  I will also check a hemoglobin A1C to see  whether or not this patient has any features of diabetes and check her  fasting blood sugars while she is here in the morning.  The patient will  be started on a heart-healthy diet and 2 gm sodium restriction.  We will  put her on a nitroglycerin drip titrated to her pain and to her blood  pressure.      Altha Harm, MD  Electronically Signed     MAM/MEDQ  D:  12/23/2007  T:  12/23/2007  Job:  [id number]   cc:   Sharlot Gowda, M.D.

## 2011-02-02 NOTE — Discharge Summary (Signed)
[name], [name] NO.:  [id number]   MEDICAL RECORD NO.:  [id number]          PATIENT TYPE:  INP   LOCATION:  2002                         FACILITY:  [facility]   PHYSICIAN:  Altha Harm, MDDATE OF BIRTH:  1975/10/16   DATE OF ADMISSION:  12/23/2007  DATE OF DISCHARGE:  12/25/2007                               DISCHARGE SUMMARY   DISCHARGE DISPOSITION:  Home.   FINAL DISCHARGE DIAGNOSES:  1. Hypertensive urgency, resolved.  2. Newly diagnosed diabetic with an elevated hemoglobin A1c.  3. Chest pain, atypical.  4. Migraine headaches.  5. Metabolic syndrome, the patient needs to be ruled out for      polycystic ovary syndrome and secondary cause for hypertension.  6. Hyperlipidemia.   DISCHARGE MEDICATIONS:  1. Pepcid AC 20 mg p.o. daily.  2. Zyrtec 10 mg p.o. daily p.r.n.  3. Ziac 5/12.5 mg p.o. daily.  4. Glucophage 500 mg p.o. b.i.d.  5. Topamax 50 mg p.o. b.i.d.  6. Toprol XL 50 mg p.o. b.i.d.  7. Zocor 40 mg p.o. nightly.  8. Midrin to use as directed.   CONSULTANTS:  Phone consultation with Dr. Cristy Hilts. Ganji.   PROCEDURES:  None.   DIAGNOSTIC STUDIES:  Chest x-ray, portable lung view which shows no  acute cardiopulmonary process.   PERTINENT LABORATORY STUDIES:  Hemoglobin A1c 6.5.  UA and microalbumin  negative.  Total cholesterol 207, triglycerides 158, LDL 132, HDL 41,  BUN 10, creatinine 0.75, sodium 136, potassium 3.8, chloride 102, bicarb  28, TSH 3.651.   PRIMARY CARE PHYSICIAN:  Dr. Susann Givens.   ALLERGIES:  IODINE, CODEINE, IMITREX, MERIDIA, IODINE.   CHIEF COMPLAINT:  Chest pain and headache.   HISTORY OF PRESENT ILLNESS:  Please see the H&P dictated on admission on  December 23, 2007.   HOSPITAL COURSE:  1. Hypertensive urgency.  The patient was admitted with markedly      elevated blood pressures.  She was initially put on a nitroglycerin      drip and was weaned off.  Lopressor was started in addition to the      Ziac,  which controlled the blood pressures nicely without any      evidence of orthostasis.  The patient is being sent home on Toprol.      Given the patient's body habitus, there was some question as to      whether or not this patient might have had secondary causes for      hypertension.  I attempted to start an investigation in the      hospital, however, the patient said that she was claustrophobic and      did not want to undergo the MRA to look at the renal arteries.  I      would recommend that the patient have a cortisol urine metanephrine      levels done as an outpatient to look for secondary causes,      particularly given her body habitus and facial hair.  2. The patient was also evaluated for diabetes with a hemoglobin A1c      which  shows this elevated at 6.5.  The patient was started on a low-      dose of Glucophage 500 mg p.o. b.i.d., and I would recommend that      the patient also have an outpatient evaluation for polycystic      ovarian syndrome.  3. Hyperlipidemia.  The patient was already evaluated for risk factors      and was found to have a markedly elevated lipid profile.  Started      on Zocor 40 mg p.o. nightly.  In terms of her chest pain, the      patient had chest pain initially with her elevated blood pressure,      however, as the blood pressure resolved so did the chest pain.  The      patient has had no further chest pain for about 48 hours.  I      discussed this with Dr. Cristy Hilts. Ganji of cardiology, and he has      spoken with the patient and will see the patient on an outpatient      setting for follow up echocardiogram and stress test.  4. Migraine headaches.  The patient had been on Topamax      prophylactically for migraines in the past, however, stopped it      about a year ago.  The patient has had a migraine headache on this      hospitalization which has not responded to Fioricet or Dilaudid.      The patient is ALLERGIC TO IMITREX and cannot really  use the      triptans right now as she is being evaluated for coronary artery      disease.  I placed the patient on Midrin to be continued as an      outpatient.  And, I have also restarted Topamax at 50 mg b.i.d. for      prophylaxis.   PHYSICAL EXAMINATION:  GENERAL:  The patient's condition at the time of  discharge is stable.  VITAL SIGNS:  Blood pressure is 134/86 with heart rate 72, temperature  97.2, respiratory rate 15, O2 sats are 98% on room air.  LUNGS:  Clear to auscultation.  HEART:  She has a normal S1-S2.  No murmurs, rubs or gallops.  She has  been in normal sinus rhythm on the monitor.  ABDOMEN:  Obese, soft, nontender, nondistended.  No masses, no  hepatosplenomegaly.  EXTREMITIES:  No clubbing, cyanosis or edema.   DIETARY RESTRICTIONS:  The patient should be on a diabetic low-  cholesterol cardiac diet.   PHYSICAL RESTRICTIONS:  None.   The patient will probably benefit from referral to outpatient diabetic  teaching program.   FOLLOWUP:  She should follow up with her primary care physician, Dr.  Susann Givens in 3-5 days, and Dr. Jacinto Halim will call her to set up the  appointment for cardiology outpatient.      Altha Harm, MD  Electronically Signed     MAM/MEDQ  D:  12/25/2007  T:  12/25/2007  Job:  [id number]   cc:   Cristy Hilts. Jacinto Halim, MD  Sharlot Gowda, M.D.

## 2011-02-05 NOTE — Discharge Summary (Signed)
NAME:  [name], HANBACK                        ACCOUNT NO.:  [id number]   MEDICAL RECORD NO.:  [id number]                   PATIENT TYPE:  OBV   LOCATION:  0478                                 FACILITY:  [facility]   PHYSICIAN:  Leonie Man, M.D.                DATE OF BIRTH:  1976/04/26   DATE OF ADMISSION:  10/17/2003  DATE OF DISCHARGE:  10/18/2003                                 DISCHARGE SUMMARY   ADMISSION DIAGNOSIS:  Right lower quadrant abdominal pain. Rule out  appendicitis.   DISCHARGE DIAGNOSES:  1. Abdominal pain.  2. Endometritis secondary to infected intrauterine device.   PROCEDURES IN HOSPITAL:  None except observation and repeat CT scan of  abdomen and pelvis.   COMPLICATIONS:  None.   CONDITION ON DISCHARGE:  Improved.   NOTE:  [name] is a 35 year old young woman who began having right  lower quadrant pain associated with nausea and vomiting, which she described  as a severity of approximately 10/10. She apparently had a syncopal episode  because of the severity of her pain. She was evaluated at [facility]  emergency room and noted to be afebrile and with leukocytosis. CT scan done  at that time showed no acute findings except for the presence of an IUD  which was in place. She was seen by her gynecologist who subsequently  referred her for evaluation after he removed the IUD. The patient was  admitted to the hospital because of her severe pain so as to facilitate pain  control and continued observation. She was again re-evaluated with a CT scan  of the abdomen which was equally unrevealing except for the singular absence  of the IUD. Over the ensuing 24 hours her pain subsided significantly,  although it was not completely relieved. Since the removal of her IUD she  has been having a rather foul-smelling bloody vaginal discharge, but  otherwise feeling better. She is being discharged now to be followed by Dr.  Marcelle Overlie for continued treatment of what I  suspect is an endometritis. I will  discharge her on Cleocin 150 mg t.i.d. in the interim. I will also give her  a prescription for promethazine 12.5 mg to use q.4-6h. p.r.n. nausea. She  should call our office or Dr. Dennie Bible office if she has a temperature  greater than 100, shaking chills, or persistent nausea and vomiting.                                               Leonie Man, M.D.    PB/MEDQ  D:  10/18/2003  T:  10/18/2003  Job:  [id number]   cc:   Duke Salvia. Marcelle Overlie, M.D.  [address]  [place] [address]  Fax: [phone]
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
## 2011-02-05 NOTE — Discharge Summary (Signed)
NAMEDILAN, FULLENWIDER NO.:  [id number]   MEDICAL RECORD NO.:  [id number]          PATIENT TYPE:  IPS   LOCATION:  0304                          FACILITY:  [facility]   PHYSICIAN:  Geoffery Lyons, M.D.      DATE OF BIRTH:  24-Nov-1975   DATE OF ADMISSION:  05/15/2006  DATE OF DISCHARGE:  05/18/2006                                 DISCHARGE SUMMARY   CHIEF COMPLAINT AND PRESENT ILLNESS:  This was the third admission to [facility] for this 35 year old white female, married,  voluntarily admitted.  Presented to the emergency room after being referred  by mental health.  She called the crisis line.  She was having thoughts of  overdosing on her medication for about two days, uncontrolled crying for  four hours prior to this presentation.  She has recent disruption in her  sleep, unable to sleep more than two hours a night, feeling sad, tearful and  depressed since her medication had been changed one week prior to this  admission.  Trazodone was decreased.  She attributes her insomnia to this  fact, along with changes in her Abilify which had been decreased and her  Topamax was also decreased.  She denied she had relapsed on opioids, had  maintained abstinence.   PAST PSYCHIATRIC HISTORY:  Third admission to [facility].  First admission in February of 2003; most recently for polysubstance  dependence April 23, 2006 through May 02, 2006.   ALCOHOL/DRUG HISTORY:  In remission for abuse of opiates.   MEDICAL HISTORY:  Migraines, high blood pressure.   MEDICATIONS:  Prilosec for gastroesophageal reflux, Cymbalta 30 mg per day,  Topamax 50 mg twice a day and Lamictal 50 mg, Abilify 30 mg in the morning,  trazodone 300 mg at bedtime, Claritin 10 mg per day, Prilosec 20 mg per day.   PHYSICAL EXAMINATION:  Performed and failed to show any acute findings.   LABORATORY DATA:  SGOT 17, SGPT 22, total bilirubin 0.6, hemoglobin A1C 6.5.  Urine  pregnancy test negative.  Sodium 139, potassium 3.9, glucose 150.  Hemoglobin 13.9.   MENTAL STATUS EXAM:  Female who is tearful, frustrated with the fact that  mental health changed her medication.  Endorsed she was doing very well  prior to the fact that she had stayed off drugs.  She is tearful,  cooperative.  Speech was normal in pace, tone and production.  Mood  depressed, irritable.  Thought processes logical, coherent and relevant.  Suicidal ruminations.  Could contract for safety.  Cognition was well-  preserved.   ADMISSION DIAGNOSES:  AXIS I:  Depressive disorder not otherwise specified.  Polysubstance dependence.  AXIS II:  No diagnosis.  AXIS III:  Migraines, high blood pressure, asthma.  AXIS IV:  Moderate.  AXIS V:  GAF upon admission 40; highest GAF in the last year 70.   HOSPITAL COURSE:  She was admitted.  She was started in individual and group  psychotherapy.  We tried to get her back on the medications that we used to  stabilize her, Abilify 30  mg per day.  As already stated, she endorsed that  she did not relapse.  She followed up at mental health as scheduled and they  started changing her medication.  Endorsed she experienced mood swings,  positive sense of being overwhelmed.  Also suicidal ideation but could  contract for safety.  She experienced migraine that was treated with  Dilaudid and Phenergan IM.  We requested internal medicine to see the  patient to help [place] with the pain management but she turned around faster  than last time and, by May 18, 2006, she was in full contact with  reality.  Endorsed that she was better.  She was back on her medication.  She felt that she did not have to stay any further as she had gotten a lot  out of the last time she was in the hospital.  As she was in full contact  with reality and objectively she was better, we went ahead and discharged to  outpatient follow-up.   DISCHARGE DIAGNOSES:  AXIS I:  Mood disorder not  otherwise specified.  Polysubstance dependence, in early partial remission.  AXIS II:  No diagnosis.  AXIS III:  Migraines, high blood pressure.  AXIS IV:  Moderate.  AXIS V:  GAF upon discharge 60.   DISCHARGE MEDICATIONS:  1. Cymbalta 30 mg per day.  2. Trazodone 300 mg at bedtime.  3. Lamictal 50 mg per day.  4. Topamax 25 mg, 2 tabs twice a day.  5. Claritin 10 mg per day.  6. Abilify 30 mg per day.  7. Catapres patch 0.1; change every seven days.  8. Advair Diskus 50/500 1 puff twice a day.  9. Norvasc 2.5 mg per day.  10.Prilosec 20 mg per day.  11.Relafen 750 mg twice a day.   FOLLOWUP:  [facility].      Geoffery Lyons, M.D.  Electronically Signed     IL/MEDQ  D:  06/06/2006  T:  06/06/2006  Job:  [id number]

## 2011-02-05 NOTE — H&P (Signed)
[name], [name] NO.:  [id number]   MEDICAL RECORD NO.:  [id number]          PATIENT TYPE:  IPS   LOCATION:  0304                          FACILITY:  [facility]   PHYSICIAN:  Geoffery Lyons, M.D.      DATE OF BIRTH:  [birth year]   DATE OF ADMISSION:  05/15/2006  DATE OF DISCHARGE:                         PSYCHIATRIC ADMISSION ASSESSMENT   IDENTIFYING INFORMATION:  This is a 35 year old white female who is married.  This is a voluntary admission.   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  She had called the crisis  line because she was having thoughts of overdosing on her medications for  about 2 days and had had uncontrolled crying for approximately 4 hours prior  to presentation.  She reported she had had recent disruption in her  sleeping, unable to sleep more than 2 hours a night, feeling sad, tearful  and depressed since her medications had been changed approximately 1 week  prior.  Her trazodone was decreased and she attributed this to her insomnia,  along with changes in her Abilify doses which had been decreased and her  Topamax which had also been decreased.  She denied any homicidal thought or  auditory or visual hallucinations.  She denies that she had relapsed on  opiates, had been able to stay clean and had no relapsed on any drugs other  than having one Xanax last week prior to a dental procedure.   PAST PSYCHIATRIC HISTORY:  This is the patient's third admission to [facility], with her first admission being in February  2003 for depression and most recently was here for about a week for  polysubstance dependence and bipolar disorder, from August 4 to 13, 2007.  She also has a history of prior overdose for which she was admitted to [facility] several years ago.  She is currently followed by [facility].   SOCIAL HISTORY:  This is a 35 year old married white female, husband is  supportive, marriage is stable.  Since being discharged from here  approximately 1-1/2 weeks ago she has been living with her in-laws and her  mother-in-law has been supervising her medications, feels that she has been  more stable.  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   FAMILY HISTORY:  Remarkable for father with a history of substance abuse.   PAST MEDICAL HISTORY:  The patient does not currently have a primary care  physician, but is scheduled to establish with [facility] here in  [place] on this Thursday, May 19, 2006, for treatment of headache not  otherwise specified.  She has had problems with elevated blood pressure from  time to time and also history of obesity.  She is currently taking Prilosec  for GERD and acknowledges having problems with perennial allergies.   MEDICATIONS:  Cymbalta 30 mg daily, Topamax 50 mg p.o. b.i.d., Lamictal 50  mg daily just went up from 25 mg daily  this past Saturday, August 25,  Abilify 30 mg q.a.m., trazodone 300 mg p.o. q.h.s., Claritin 10 mg daily,  and Prilosec 20 mg daily.  Previously she acknowledges abusing opiates and  had been using large amounts of Percocet and other opiates but reports now  that she is currently clean.  She has a distant history of asthma, does not  currently use inhalers and cannot remember her last asthma attack.   DRUG ALLERGIES:  Multiple drug allergies including SULFA, PENICILLIN,  LEVAQUIN, CODEINE, TORADOL, IMITREX, MERIDIAN.   POSITIVE PHYSICAL FINDINGS:  This is a tall, obese white female who appears  in no distress today.  Affect is a bit constricted and she is complaining of  headaches but does not appear to be in much pain, no diaphoresis.  Full  physical examination was done in the emergency room.  It is noted in the  record.  At the time of admission, she is  afebrile.  Pulse 87, respirations  18, blood pressure 180/103, and then this morning on recheck her blood  pressure is 167/94.  She denies any palpitations or sweats or tremor.  Denies any symptoms of drug withdrawal.  She weighs 287 pounds, 5 feet 7  inches tall.   DIAGNOSTIC STUDIES:  Urine drug screen positive for benzodiazepines, alcohol  level less than 5.  Electrolytes were all within normal limits.  Random  glucose was 150, BUN 11, creatinine 0.8.   MENTAL STATUS EXAM:  Fully alert female, a bit tearful, very frustrated with  the fact that the [facility] her medications.  She felt  that she was doing very well, proud of the fact that she had stayed off  medications, having some problems with re-onset again of headache.  Did  obtain primary care physician plan to go to [facility] and was looking  forward to that.  She is tearful, cooperative.  Affect is blunted.  Speech  is normal in pace, tone and production.  No pressure.  Mood depressed,  somewhat irritable.  Thought processes logical and coherent, positive for  suicidal ideation, clearly she had a plan to overdose, is able to contract  today for safety on the unit, no homicidal thought.  Cognitively she is  intact and oriented x3. Insight is satisfactory, impulse control and  judgment within normal limits.  Calculation is intact.  Concentration is  intact.   ADMISSION DIAGNOSIS:  AXIS I:  Depressive disorder not otherwise specified,  polysubstance abuse rule out dependence.  AXIS II:  Deferred.  AXIS III:  Headache not otherwise specified, obesity, elevated blood  pressure, rule out hypertension, asthma by history.  AXIS IV:  Deferred.  AXIS V:  Current 40, past year 73.   INITIAL PLAN OF CARE:  Plan is to voluntarily admit the patient with q.15  minute checks in place.  We are going to go ahead and continue her current doses of Lamictal and Cymbalta.  We will restart her Abilify at 30 mg daily,  go  ahead and treat her headache with ibuprofen 800 mg t.i.d. p.r.n. and also  with some Fioricet today, 2 tablets this morning q.4h, maximum of 6 tabs  daily.  We will restart her Prilosec and we will go ahead and put her back  on her trazodone 300 mg at bedtime and try to get her re-stabilized in hopes  of seeing if we can get her discharged in time to keep her appointment with  her new primary care physician this Thursday.  So far she is in agreement  with the plan and we have made available to her also Claritin 10 mg daily  p.r.n. for her allergies and Phenergan 25 mg q.4h p.r.n. for her nausea.   ESTIMATED LENGTH OF STAY:  3-4 days.     Margaret A. Scott, N.P.      Geoffery Lyons, M.D.  Electronically Signed   MAS/MEDQ  D:  05/16/2006  T:  05/16/2006  Job:  [id number]

## 2011-02-05 NOTE — Discharge Summary (Signed)
[facility]  Patient:    [name], [name] Visit Number: [id number] MRN: [id number]          Service Type: PSY Location: 400 [id number] 02 Attending Physician:  Jeanice Lim Dictated by:   Reymundo Poll Dub Mikes, M.D. Admit Date:  10/29/2001 Discharge Date: 11/02/2001                             Discharge Summary  CHIEF COMPLAINT AND HISTORY OF PRESENT ILLNESS:  This was the first admission to [facility] for this 35 year old female involuntarily committed for suicidal ideas.  The patient presented with a history of suicidal thoughts for approximately one week with plan to overdose. She felt depressed, having increased mood swings, crying, and stated that she could "tear a room in a minute."  She reported her medications were not working but they did when she was first discharged from [facility] in November 2002.  She had been having conflict with her husband, decreased self-esteem, denied any psychotic symptoms, reported some positive suicidal thoughts but would contract for safety.  Reported her sleep had increased, her appetite had increased although reported that she did lose 50 pounds. Reported compliance with medications.  PAST PSYCHIATRIC HISTORY:  First time [facility], 15 days at [facility] when she cut her wrists.  Dr. Yetta Barre is her psychiatrist in [place].  SUBSTANCE ABUSE HISTORY:  Denies the use or abuse of any alcohol although recently smoked marijuana.  PAST MEDICAL HISTORY:  Type 2 diabetes mellitus, heart murmur, irritable bowel syndrome.  MEDICATIONS ON ADMISSION:  1. Celexa 50 mg per day.  2. Corgard 20 mg every day.  3. Wellbutrin slow release 150 mg twice a day.  4. Restoril 45 mg at bedtime.  5. Klonopin 1 mg three times a day and two at bedtime.  6. Glucophage XR 500 mg twice a day.  7. Neurontin 600 mg three times a day.  8. Lasix 40 mg daily.  9. K-Dur 20 mEq daily. 10.  Claritin-D one daily. 11. Nexium 40 mg twice a day.  PHYSICAL EXAMINATION:  GENERAL:  Performed and failed to show any acute findings.  MENTAL STATUS EXAMINATION ON ADMISSION:  Alert female, obese, casually dressed, cooperative, good eye contact.  Speech was normal and relevant.  Mood was depressed.  Affect was pleasant and flat.  Thought processes: Coherent; no evidence of psychosis, no auditory or visual hallucinations, no suicidal or homicidal ideas, no paranoia.  Cognitive: Intact.  ADMITTING DIAGNOSES: Axis I:    1. Depressive disorder, not otherwise specified.            2. Rule out bipolar disorder, depressed. Axis II:   No diagnosis. Axis III:  1. Type 2 diabetes mellitus.            2. Irritable bowel syndrome.            3. Heart murmur. Axis IV:   Moderate. Axis V:    Global assessment of functioning upon admission 30, highest global            assessment of functioning in the last year 65.  LABORATORY DATA:  CBC was within normal limits.  Blood chemistries were within normal limits.  Thyroid profile was within normal limits.  Drug screen was positive for marijuana, amphetamines, and benzodiazepines.  HOSPITAL COURSE:  She was admitted and started in intensive individual and group psychotherapy.  She  was kept on basically her medications.  Her Celexa was decreased to 40 mg, Wellbutrin was increased 150 mg twice a day, Neurontin was increased to 600 mg four times a day, Celexa was decreased to 20 mg per day.  She was feeling much better when she came into hospital and once the Neurontin was increased, felt more at ease, more under control.  By February 13 she felt much improved, in full contact with reality, no suicidal ideas, no homicidal ideas, willing and motivated to pursue further followup, felt that increasing the Neurontin did help and she was willing to pursue it further. As there were no suicidal ideas, no homicidal ideas, we went ahead and discharged to  outpatient followup.  DISCHARGE DIAGNOSES: Axis I:    1. Major depression, recurrent.            2. Rule out bipolar disorder, depressed. Axis II:   No diagnosis. Axis III:  1. Type 2 diabetes mellitus.            2. Irritable bowel syndrome.            3. Heart murmur. Axis IV:   Moderate. Axis V:    Global assessment of functioning upon discharge 55-60.  DISCHARGE MEDICATIONS:  1. Glucophage XR 500 mg twice a day.  2. Restoril 15 mg three at night.  3. Klonopin 1 mg three times a day and two at night.  4. Bentyl 10 mg three times a day.  5. Reglan 10 mg twice a day.  6. Lasix 40 mg daily.  7. K-Dur 20 mEq daily.  8. Claritin-D daily.  9. Nexium 40 mg. 10. Corgard 20 mg daily. 11. Wellbutrin slow release 150 mg twice a day. 12. Neurontin 600 mg one four times a day. 13. Celexa 20 mg daily. 14. Vistaril as needed for sleep.  FOLLOWUP:  [facility]. Dictated by:   Reymundo Poll Dub Mikes, M.D. Attending Physician:  Jeanice Lim DD:  12/06/01 TD:  12/07/01 Job: [id number]
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
## 2011-03-15 IMAGING — CT CT HEAD WITHOUT CONTRAST
2 series · 16 of 30 positions shown, 20 images · non-contrast
Comparison: none

REASON FOR EXAM: worst headache ever / hx of Migraines
COMMENTS:

[Series 2: without · axial · non-contrast · 0.45mm/px · z∈[+418,+552]mm · 13 of 33 slices shown, 17 images]
[im 3/33  brain]
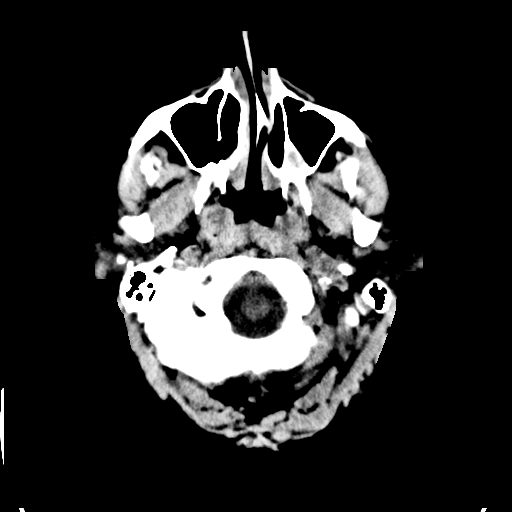
[im 3/33  bone]
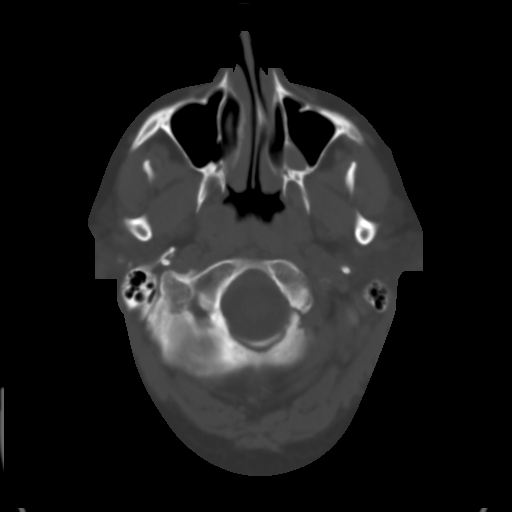
[im 5/33  brain]
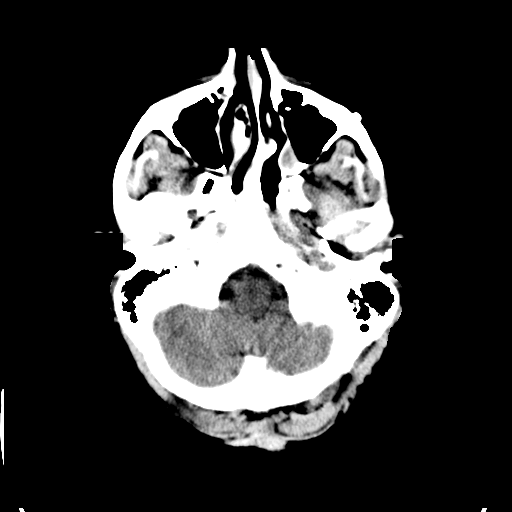
[im 7/33  brain]
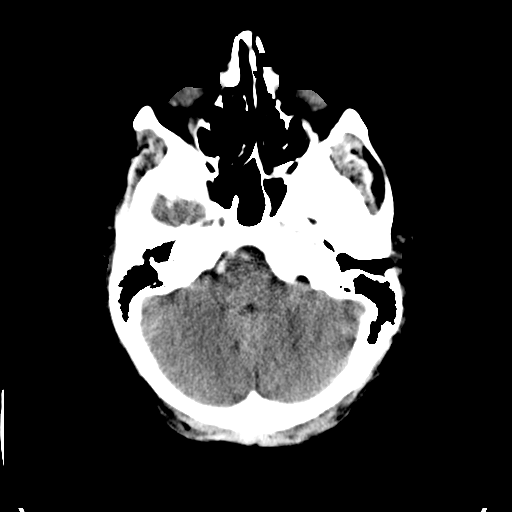
[im 10/33  brain]
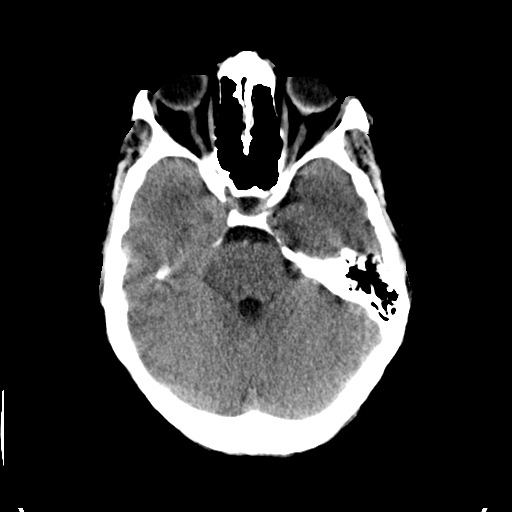
[im 12/33  brain]
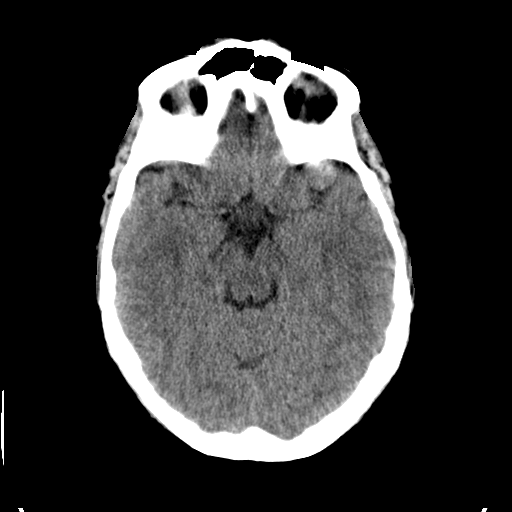
[im 12/33  bone]
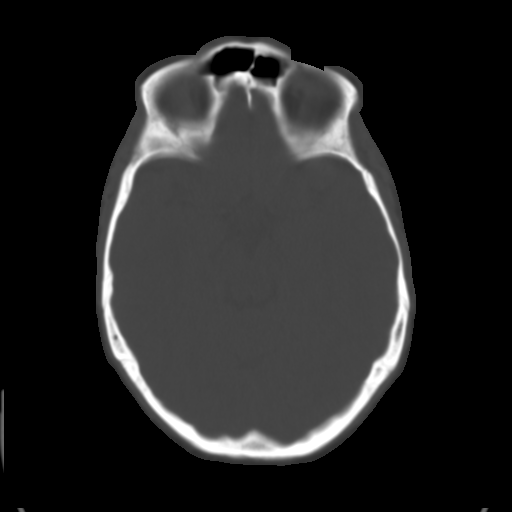
[im 14/33  brain]
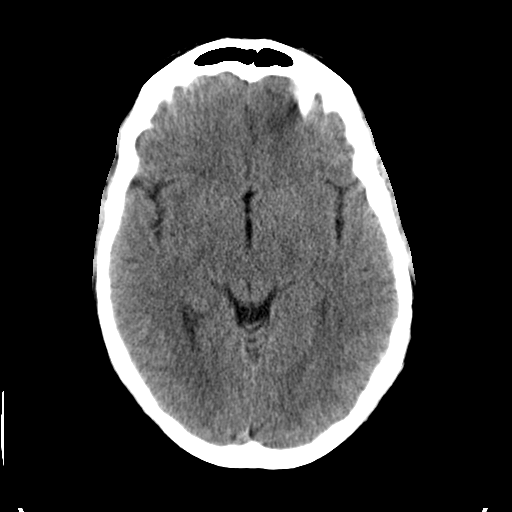
[im 17/33  brain]
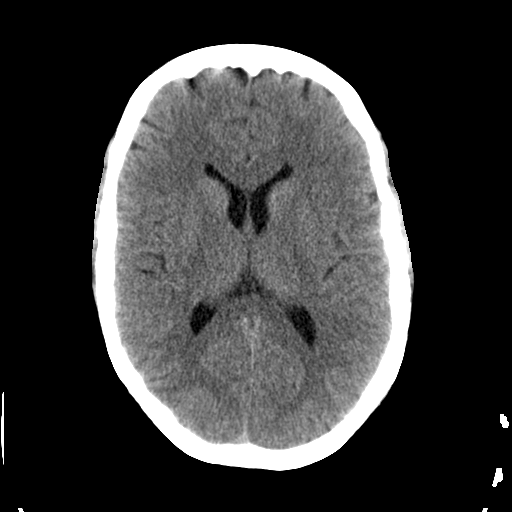
[im 19/33  brain]
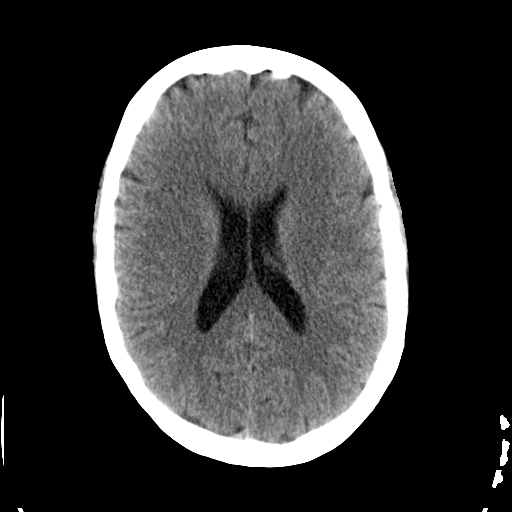
[im 21/33  brain]
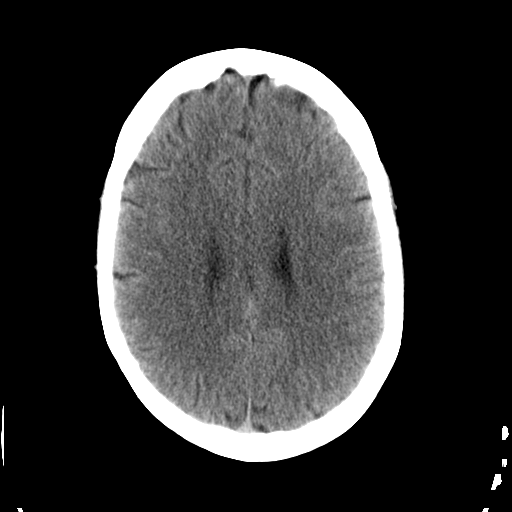
[im 21/33  bone]
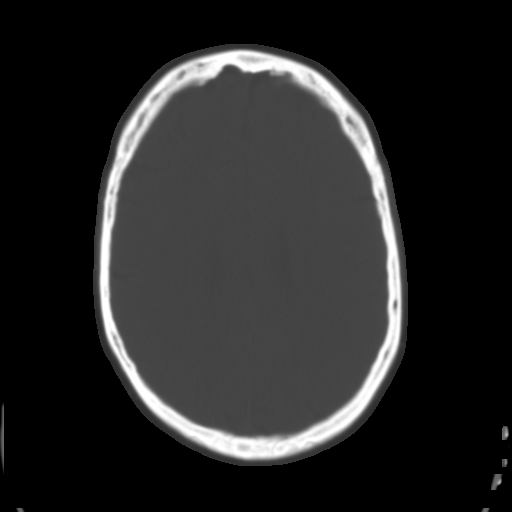
[im 23/33  brain]
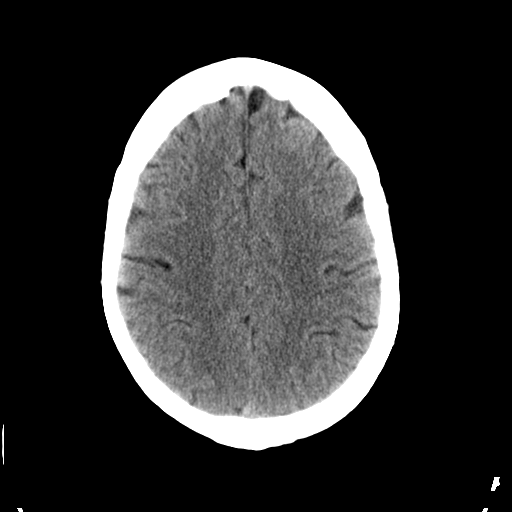
[im 26/33  brain]
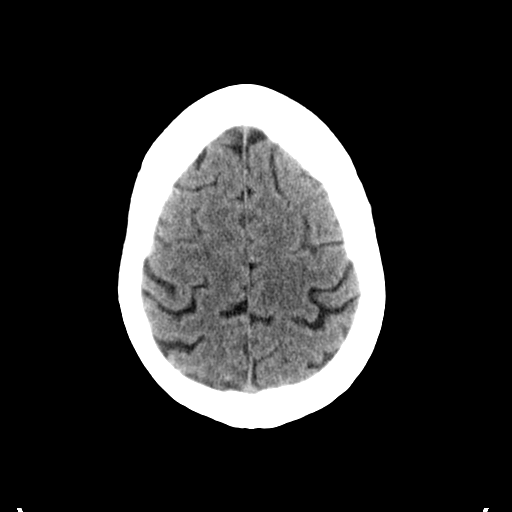
[im 28/33  brain]
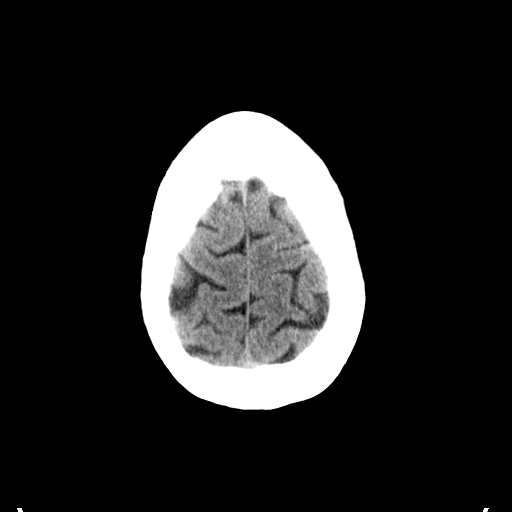
[im 30/33  brain]
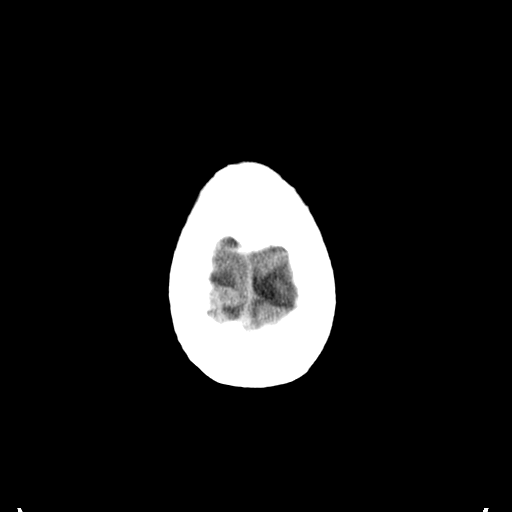
[im 30/33  bone]
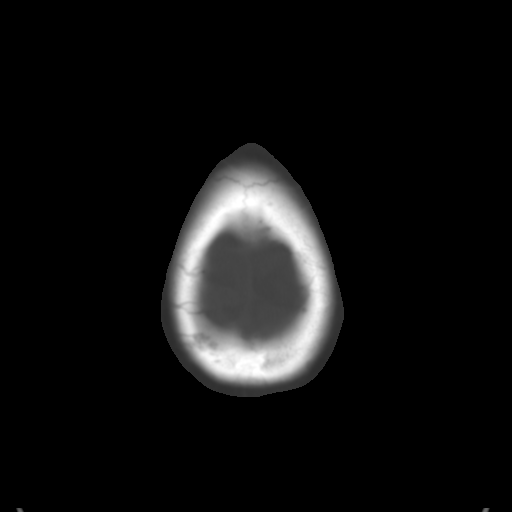

[Series 3: bone · axial · 0.45mm/px · z∈[+418,+462]mm · 3 of 33 slices shown]
[im 3/33  bone]
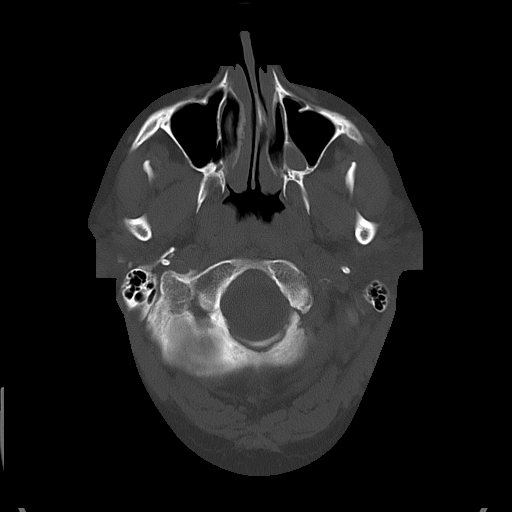
[im 7/33  bone]
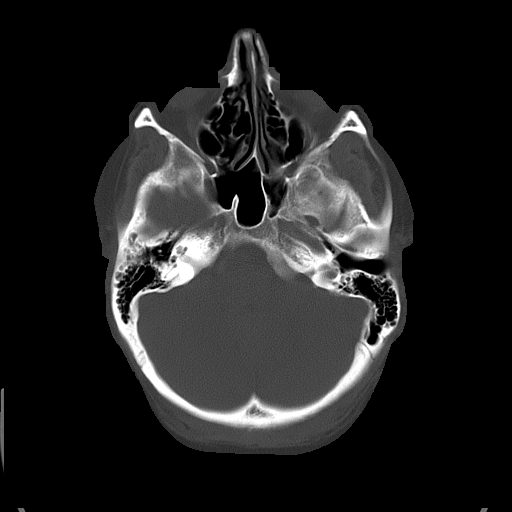
[im 12/33  bone]
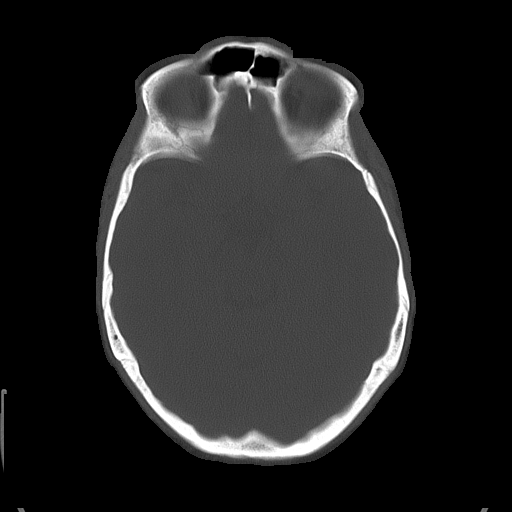

[16 of 30 positions shown; findings below may reference images not displayed]

PROCEDURE:     CT  - CT HEAD WITHOUT CONTRAST  - February 04, 2010 [DATE]

RESULT:     Axial noncontrast CT scanning was performed through the brain at
5 mm intervals and slice thicknesses. Comparison is made to study 19 August, 2007.

The ventricles are normal in size and position. There is no intracranial
hemorrhage nor intracranial mass effect. The cerebellum and brainstem are
normal in density. At bone window settings there is stable hypodensity
posteriorly in the left maxillary sinus which may reflect a retention cyst.
Elsewhere the paranasal sinuses are clear and the mastoid air cells are well
pneumatized. There is no evidence of an acute skull fracture.
IMPRESSION: Normal noncontrast CT scan of the brain.

A preliminary report was sent to the [HOSPITAL] the conclusion
of the study.

## 2011-04-09 ENCOUNTER — Emergency Department (HOSPITAL_COMMUNITY)
Admission: EM | Admit: 2011-04-09 | Discharge: 2011-04-09 | Disposition: A | Discharge status: Home / Self Care | Payer: Self-pay | Attending: Emergency Medicine | Admitting: Emergency Medicine

## 2011-04-09 ENCOUNTER — Emergency Department (HOSPITAL_COMMUNITY): Payer: Self-pay

## 2011-04-09 DIAGNOSIS — E119 Type 2 diabetes mellitus without complications: Secondary | ICD-10-CM | POA: Insufficient documentation

## 2011-04-09 DIAGNOSIS — R112 Nausea with vomiting, unspecified: Secondary | ICD-10-CM | POA: Insufficient documentation

## 2011-04-09 DIAGNOSIS — Y92009 Unspecified place in unspecified non-institutional (private) residence as the place of occurrence of the external cause: Secondary | ICD-10-CM | POA: Insufficient documentation

## 2011-04-09 DIAGNOSIS — I10 Essential (primary) hypertension: Secondary | ICD-10-CM | POA: Insufficient documentation

## 2011-04-09 DIAGNOSIS — M545 Low back pain, unspecified: Secondary | ICD-10-CM | POA: Insufficient documentation

## 2011-04-09 DIAGNOSIS — W19XXXA Unspecified fall, initial encounter: Secondary | ICD-10-CM | POA: Insufficient documentation

## 2011-04-09 DIAGNOSIS — M25569 Pain in unspecified knee: Secondary | ICD-10-CM | POA: Insufficient documentation

## 2011-04-09 DIAGNOSIS — M25559 Pain in unspecified hip: Secondary | ICD-10-CM | POA: Insufficient documentation

## 2011-04-09 DIAGNOSIS — T1490XA Injury, unspecified, initial encounter: Secondary | ICD-10-CM | POA: Insufficient documentation

## 2011-04-09 LAB — COMPREHENSIVE METABOLIC PANEL
Albumin: 3.8 g/dL (ref 3.5–5.2)
Alkaline Phosphatase: 81 U/L (ref 39–117)
BUN: 13 mg/dL (ref 6–23)
Calcium: 9.2 mg/dL (ref 8.4–10.5)
Creatinine, Ser: 0.84 mg/dL (ref 0.50–1.10)
Potassium: 3.6 mEq/L (ref 3.5–5.1)
Total Protein: 7.5 g/dL (ref 6.0–8.3)

## 2011-04-09 LAB — DIFFERENTIAL
Lymphocytes Relative: 25 % (ref 12–46)
Monocytes Absolute: 0.7 10*3/uL (ref 0.1–1.0)
Monocytes Relative: 6 % (ref 3–12)
Neutro Abs: 7.9 10*3/uL — ABNORMAL HIGH (ref 1.7–7.7)

## 2011-04-09 LAB — LIPASE, BLOOD: Lipase: 42 U/L (ref 11–59)

## 2011-04-09 LAB — CBC
HCT: 41.7 % (ref 36.0–46.0)
Hemoglobin: 13.5 g/dL (ref 12.0–15.0)
MCH: 28.8 pg (ref 26.0–34.0)
MCHC: 32.4 g/dL (ref 30.0–36.0)

## 2011-04-09 LAB — URINE MICROSCOPIC-ADD ON

## 2011-04-09 LAB — URINALYSIS, ROUTINE W REFLEX MICROSCOPIC
Glucose, UA: NEGATIVE mg/dL
Protein, ur: 30 mg/dL — AB

## 2011-05-14 ENCOUNTER — Emergency Department (HOSPITAL_COMMUNITY)
Admission: EM | Admit: 2011-05-14 | Discharge: 2011-05-14 | Disposition: A | Discharge status: Home / Self Care | Payer: Self-pay | Attending: Emergency Medicine | Admitting: Emergency Medicine

## 2011-05-14 DIAGNOSIS — R51 Headache: Secondary | ICD-10-CM | POA: Insufficient documentation

## 2011-05-14 DIAGNOSIS — K089 Disorder of teeth and supporting structures, unspecified: Secondary | ICD-10-CM | POA: Insufficient documentation

## 2011-05-14 DIAGNOSIS — R220 Localized swelling, mass and lump, head: Secondary | ICD-10-CM | POA: Insufficient documentation

## 2011-05-14 DIAGNOSIS — I10 Essential (primary) hypertension: Secondary | ICD-10-CM | POA: Insufficient documentation

## 2011-05-14 DIAGNOSIS — Z87442 Personal history of urinary calculi: Secondary | ICD-10-CM | POA: Insufficient documentation

## 2011-05-14 DIAGNOSIS — F319 Bipolar disorder, unspecified: Secondary | ICD-10-CM | POA: Insufficient documentation

## 2011-05-14 DIAGNOSIS — R221 Localized swelling, mass and lump, neck: Secondary | ICD-10-CM | POA: Insufficient documentation

## 2011-05-14 DIAGNOSIS — E119 Type 2 diabetes mellitus without complications: Secondary | ICD-10-CM | POA: Insufficient documentation

## 2011-06-10 LAB — CBC
HCT: 44.8
Hemoglobin: 15.1 — ABNORMAL HIGH
MCV: 90.7
RBC: 4.95
WBC: 11.9 — ABNORMAL HIGH

## 2011-06-10 LAB — BASIC METABOLIC PANEL
Chloride: 107
GFR calc Af Amer: >60
GFR calc non Af Amer: >60
Potassium: 3.8
Sodium: 138

## 2011-06-10 LAB — URINE MICROSCOPIC-ADD ON

## 2011-06-10 LAB — DIFFERENTIAL
Eosinophils Absolute: 0.1
Eosinophils Relative: 1
Lymphocytes Relative: 28
Lymphs Abs: 3.4
Monocytes Relative: 7

## 2011-06-10 LAB — URINALYSIS, ROUTINE W REFLEX MICROSCOPIC
Glucose, UA: NEGATIVE
Hgb urine dipstick: NEGATIVE
Ketones, ur: 15 — AB
Leukocytes, UA: NEGATIVE
Protein, ur: 30 — AB
pH: 5.5

## 2011-06-10 LAB — RPR: RPR Ser Ql: NONREACTIVE

## 2011-06-15 LAB — BASIC METABOLIC PANEL
BUN: 10
CO2: 28
Calcium: 9.4
GFR calc non Af Amer: >60
Glucose, Bld: 145 — ABNORMAL HIGH

## 2011-06-15 LAB — LIPID PANEL
Total CHOL/HDL Ratio: 5
VLDL: 34

## 2011-06-15 LAB — HEPATIC FUNCTION PANEL
ALT: 23
Albumin: 3.4 — ABNORMAL LOW
Alkaline Phosphatase: 77
Total Protein: 6

## 2011-06-15 LAB — TROPONIN I: Troponin I: 0.01

## 2011-06-15 LAB — CBC
Hemoglobin: 15.4 — ABNORMAL HIGH
MCHC: 34.3
MCV: 89.6
RBC: 5.01

## 2011-06-15 LAB — DIFFERENTIAL
Basophils Relative: 0
Eosinophils Absolute: 0.3
Eosinophils Relative: 2
Lymphocytes Relative: 30
Neutro Abs: 8.2 — ABNORMAL HIGH

## 2011-06-15 LAB — CK TOTAL AND CKMB (NOT AT ARMC)
CK, MB: 0.6
CK, MB: 0.6
Relative Index: INVALID
Relative Index: INVALID
Total CK: 37
Total CK: 38

## 2011-06-15 LAB — POCT I-STAT, CHEM 8
BUN: 9
Creatinine, Ser: 1
Glucose, Bld: 149 — ABNORMAL HIGH
Hemoglobin: 16 — ABNORMAL HIGH
Potassium: 4

## 2011-06-15 LAB — POCT CARDIAC MARKERS
CKMB, poc: <1 — ABNORMAL LOW
Troponin i, poc: <0.05

## 2011-06-15 LAB — MICROALBUMIN, URINE: Microalb, Ur: 1.52

## 2011-06-18 ENCOUNTER — Encounter (HOSPITAL_COMMUNITY): Payer: Self-pay | Admitting: Radiology

## 2011-06-18 ENCOUNTER — Emergency Department (HOSPITAL_COMMUNITY): Payer: Self-pay

## 2011-06-18 ENCOUNTER — Emergency Department (HOSPITAL_COMMUNITY)
Admission: EM | Admit: 2011-06-18 | Discharge: 2011-06-18 | Disposition: A | Discharge status: Home / Self Care | Payer: Self-pay | Attending: Emergency Medicine | Admitting: Emergency Medicine

## 2011-06-18 DIAGNOSIS — I10 Essential (primary) hypertension: Secondary | ICD-10-CM | POA: Insufficient documentation

## 2011-06-18 DIAGNOSIS — K219 Gastro-esophageal reflux disease without esophagitis: Secondary | ICD-10-CM | POA: Insufficient documentation

## 2011-06-18 DIAGNOSIS — R1013 Epigastric pain: Secondary | ICD-10-CM | POA: Insufficient documentation

## 2011-06-18 DIAGNOSIS — F319 Bipolar disorder, unspecified: Secondary | ICD-10-CM | POA: Insufficient documentation

## 2011-06-18 DIAGNOSIS — E119 Type 2 diabetes mellitus without complications: Secondary | ICD-10-CM | POA: Insufficient documentation

## 2011-06-18 HISTORY — DX: Essential (primary) hypertension: I10

## 2011-06-18 LAB — DIFFERENTIAL
Basophils Absolute: 0.1 10*3/uL (ref 0.0–0.1)
Basophils Relative: 1 % (ref 0–1)
Eosinophils Absolute: 0.3 10*3/uL (ref 0.0–0.7)
Eosinophils Relative: 3 % (ref 0–5)
Lymphocytes Relative: 42 % (ref 12–46)
Lymphs Abs: 4.3 10*3/uL — ABNORMAL HIGH (ref 0.7–4.0)
Monocytes Absolute: 0.8 10*3/uL (ref 0.1–1.0)
Monocytes Relative: 8 % (ref 3–12)
Neutro Abs: 4.9 10*3/uL (ref 1.7–7.7)
Neutrophils Relative %: 47 % (ref 43–77)

## 2011-06-18 LAB — URINALYSIS, ROUTINE W REFLEX MICROSCOPIC
Bilirubin Urine: NEGATIVE
Glucose, UA: NEGATIVE mg/dL
Hgb urine dipstick: NEGATIVE
Ketones, ur: NEGATIVE mg/dL
Leukocytes, UA: NEGATIVE
Nitrite: NEGATIVE
Protein, ur: NEGATIVE mg/dL
Specific Gravity, Urine: 1.02 (ref 1.005–1.030)
Urobilinogen, UA: 0.2 mg/dL (ref 0.0–1.0)
pH: 7 (ref 5.0–8.0)

## 2011-06-18 LAB — CBC
HCT: 38.1 % (ref 36.0–46.0)
Hemoglobin: 13.1 g/dL (ref 12.0–15.0)
MCH: 30.3 pg (ref 26.0–34.0)
MCHC: 34.4 g/dL (ref 30.0–36.0)
MCV: 88.2 fL (ref 78.0–100.0)
Platelets: 265 10*3/uL (ref 150–400)
RBC: 4.32 MIL/uL (ref 3.87–5.11)
RDW: 13.6 % (ref 11.5–15.5)
WBC: 10.3 10*3/uL (ref 4.0–10.5)

## 2011-06-18 LAB — POCT I-STAT, CHEM 8
BUN: 27 mg/dL — ABNORMAL HIGH (ref 6–23)
Creatinine, Ser: 1 mg/dL (ref 0.50–1.10)
Glucose, Bld: 136 mg/dL — ABNORMAL HIGH (ref 70–99)
Hemoglobin: 13.6 g/dL (ref 12.0–15.0)
Potassium: 3.6 mEq/L (ref 3.5–5.1)
Sodium: 140 mEq/L (ref 135–145)

## 2011-06-18 LAB — LIPASE, BLOOD: Lipase: 32 U/L (ref 11–59)

## 2011-06-18 MED ORDER — IOHEXOL 300 MG/ML  SOLN
100.0000 mL | Freq: Once | INTRAMUSCULAR | Status: AC | PRN
  Administered 2011-06-18: 100 mL via INTRAVENOUS

## 2011-06-21 LAB — CBC
HCT: 41.5
Hemoglobin: 13.9
MCV: 92.1
RBC: 4.51
WBC: 11.9 — ABNORMAL HIGH

## 2011-06-21 LAB — URINALYSIS, ROUTINE W REFLEX MICROSCOPIC
Glucose, UA: NEGATIVE
Hgb urine dipstick: NEGATIVE
pH: 6

## 2011-06-21 LAB — COMPREHENSIVE METABOLIC PANEL
Albumin: 3.7
Alkaline Phosphatase: 66
BUN: 12
CO2: 27
Chloride: 99
GFR calc non Af Amer: >60
Glucose, Bld: 141 — ABNORMAL HIGH
Potassium: 4.5
Total Bilirubin: 0.7

## 2011-06-21 LAB — DIFFERENTIAL
Basophils Absolute: 0.1
Basophils Relative: 1
Monocytes Absolute: 0.7
Neutro Abs: 7.3
Neutrophils Relative %: 61

## 2011-06-21 LAB — LIPASE, BLOOD: Lipase: 19

## 2011-06-21 LAB — POCT PREGNANCY, URINE: Preg Test, Ur: NEGATIVE

## 2011-06-22 LAB — GLUCOSE, CAPILLARY

## 2011-07-05 LAB — I-STAT 8, (EC8 V) (CONVERTED LAB)
Acid-base deficit: 3 — ABNORMAL HIGH
Chloride: 105
Glucose, Bld: 110 — ABNORMAL HIGH
HCT: 42
Operator id: 235561
Potassium: 3.9
Potassium: 3.9
TCO2: 25
pH, Ven: 7.322 — ABNORMAL HIGH

## 2011-08-26 ENCOUNTER — Encounter (HOSPITAL_COMMUNITY): Payer: Self-pay

## 2011-08-26 ENCOUNTER — Emergency Department (HOSPITAL_COMMUNITY)
Admission: EM | Admit: 2011-08-26 | Discharge: 2011-08-26 | Disposition: A | Discharge status: Home / Self Care | Payer: Self-pay | Attending: Emergency Medicine | Admitting: Emergency Medicine

## 2011-08-26 DIAGNOSIS — I10 Essential (primary) hypertension: Secondary | ICD-10-CM | POA: Insufficient documentation

## 2011-08-26 DIAGNOSIS — K0889 Other specified disorders of teeth and supporting structures: Secondary | ICD-10-CM

## 2011-08-26 DIAGNOSIS — E119 Type 2 diabetes mellitus without complications: Secondary | ICD-10-CM | POA: Insufficient documentation

## 2011-08-26 DIAGNOSIS — K089 Disorder of teeth and supporting structures, unspecified: Secondary | ICD-10-CM | POA: Insufficient documentation

## 2011-08-26 MED ORDER — CLINDAMYCIN HCL 150 MG PO CAPS: ORAL_CAPSULE | ORAL | Status: DC

## 2011-08-26 MED ORDER — HYDROCODONE-ACETAMINOPHEN 5-500 MG PO TABS: 1.0000 | ORAL_TABLET | Freq: Four times a day (QID) | ORAL | Status: AC | PRN

## 2011-08-26 NOTE — ED Provider Notes (Signed)
History     CSN: [id number] Arrival date & time: 08/26/2011  2:33 PM   First MD Initiated Contact with Patient 08/26/11 1543      Chief Complaint  Patient presents with  . Dental Pain    (Consider location/radiation/quality/duration/timing/severity/associated sxs/prior treatment) HPI  Patient presents to ER complaining of a few day hx of left upper dental pain associated with a previous "capped tooth" that she states that pain is worse at night and pain despite taking tylenol and ibuprofen with last dose of 600mg  ibuprofen in waiting room. States pain is aggravated but touch, chewing and cold air. Denies fevers, chills, facial swelling, difficulty swallowing. Pain was gradual onset, persistent and worsening.   Past Medical History  Diagnosis Date  . Diabetes mellitus   . Hypertension     History reviewed. No pertinent past surgical history.  No family history on file.  History  Substance Use Topics  . Smoking status: Current Everyday Smoker  . Smokeless tobacco: Not on file  . Alcohol Use: No    OB History    Grav Para Term Preterm Abortions TAB SAB Ect Mult Living                  Review of Systems  All other systems reviewed and are negative.    Allergies  Betadine; Imitrex; Levaquin; Penicillins; Shellfish allergy; Sulfa antibiotics; Tequin; and Tramadol  Home Medications   Current Outpatient Rx  Name Route Sig Dispense Refill  . DULOXETINE HCL 60 MG PO CPEP Oral Take 60 mg by mouth daily.      . IBUPROFEN 200 MG PO TABS Oral Take 800 mg by mouth every 6 (six) hours as needed.      Marland Kitchen LISINOPRIL-HYDROCHLOROTHIAZIDE 20-12.5 MG PO TABS Oral Take 1 tablet by mouth daily.      . TOPIRAMATE 100 MG PO TABS Oral Take 100 mg by mouth 2 (two) times daily. Take 3 tablets once daily     . TRAZODONE HCL 300 MG PO TABS Oral Take 300 mg by mouth at bedtime.        BP 143/99  Pulse 86  Temp(Src) 98.4 F (36.9 C) (Oral)  Resp 20  Ht 5\' 9"  (1.753 m)  Wt 252 lb  (114.306 kg)  BMI 37.21 kg/m2  SpO2 100%  LMP 08/26/2011  Physical Exam  Nursing note and vitals reviewed. Constitutional: She is oriented to person, place, and time. She appears well-developed and well-nourished.  HENT:  Head: Normocephalic and atraumatic.       Previous dental cap of left upper molar with TTP of surrounding gingiva but no gingival mass or fluctuance.   Eyes: Conjunctivae are normal.  Neck: Normal range of motion. Neck supple.  Cardiovascular: Normal rate and regular rhythm.   Pulmonary/Chest: Effort normal and breath sounds normal.  Musculoskeletal: Normal range of motion.  Lymphadenopathy:    She has no cervical adenopathy.  Neurological: She is alert and oriented to person, place, and time.  Skin: Skin is warm and dry.  Psychiatric: She has a normal mood and affect. Her behavior is normal.    ED Course  Procedures (including critical care time)  Labs Reviewed - No data to display No results found.   1. Pain, dental       MDM  Afebrile, non toxic appearing. No signs or symptoms of dental abscess. Swallowing secretions well.         Jenness [place], [place] 08/26/11 2358

## 2011-08-26 NOTE — ED Notes (Signed)
Pt. Reports pain at upper left tooth.  Pt. States "pain gets worse at night.  Is causing vomiting".

## 2011-08-26 NOTE — ED Notes (Signed)
Pt reports dental pain on upper left side that gets worse at night. The pain has been bad enough that she vomits from the pain.

## 2011-08-27 NOTE — ED Provider Notes (Signed)
Medical screening examination/treatment/procedure(s) were performed by non-physician practitioner and as supervising physician I was immediately available for consultation/collaboration.  Raeford Razor, MD 08/27/11 [phone]

## 2011-11-08 ENCOUNTER — Emergency Department (HOSPITAL_COMMUNITY)
Admission: EM | Admit: 2011-11-08 | Discharge: 2011-11-08 | Disposition: A | Discharge status: Home / Self Care | Payer: Self-pay | Attending: Emergency Medicine | Admitting: Emergency Medicine

## 2011-11-08 ENCOUNTER — Emergency Department (HOSPITAL_COMMUNITY): Payer: Self-pay

## 2011-11-08 ENCOUNTER — Other Ambulatory Visit (HOSPITAL_COMMUNITY): Payer: Self-pay

## 2011-11-08 ENCOUNTER — Encounter (HOSPITAL_COMMUNITY): Payer: Self-pay

## 2011-11-08 DIAGNOSIS — B9689 Other specified bacterial agents as the cause of diseases classified elsewhere: Secondary | ICD-10-CM | POA: Insufficient documentation

## 2011-11-08 DIAGNOSIS — E119 Type 2 diabetes mellitus without complications: Secondary | ICD-10-CM | POA: Insufficient documentation

## 2011-11-08 DIAGNOSIS — A499 Bacterial infection, unspecified: Secondary | ICD-10-CM | POA: Insufficient documentation

## 2011-11-08 DIAGNOSIS — R1031 Right lower quadrant pain: Secondary | ICD-10-CM | POA: Insufficient documentation

## 2011-11-08 DIAGNOSIS — I10 Essential (primary) hypertension: Secondary | ICD-10-CM | POA: Insufficient documentation

## 2011-11-08 DIAGNOSIS — R197 Diarrhea, unspecified: Secondary | ICD-10-CM | POA: Insufficient documentation

## 2011-11-08 DIAGNOSIS — R112 Nausea with vomiting, unspecified: Secondary | ICD-10-CM | POA: Insufficient documentation

## 2011-11-08 DIAGNOSIS — N760 Acute vaginitis: Secondary | ICD-10-CM | POA: Insufficient documentation

## 2011-11-08 HISTORY — DX: Migraine, unspecified, not intractable, without status migrainosus: G43.909

## 2011-11-08 HISTORY — DX: Gastro-esophageal reflux disease without esophagitis: K21.9

## 2011-11-08 HISTORY — DX: Major depressive disorder, single episode, unspecified: F32.9

## 2011-11-08 HISTORY — DX: Depression, unspecified: F32.A

## 2011-11-08 HISTORY — DX: Hyperlipidemia, unspecified: E78.5

## 2011-11-08 LAB — COMPREHENSIVE METABOLIC PANEL
Albumin: 3.5 g/dL (ref 3.5–5.2)
Alkaline Phosphatase: 83 U/L (ref 39–117)
BUN: 9 mg/dL (ref 6–23)
Potassium: 3.9 mEq/L (ref 3.5–5.1)
Sodium: 138 mEq/L (ref 135–145)
Total Protein: 7.1 g/dL (ref 6.0–8.3)

## 2011-11-08 LAB — URINALYSIS, ROUTINE W REFLEX MICROSCOPIC
Bilirubin Urine: NEGATIVE
Ketones, ur: NEGATIVE mg/dL
Nitrite: NEGATIVE
Urobilinogen, UA: 0.2 mg/dL (ref 0.0–1.0)

## 2011-11-08 LAB — CBC
MCH: 29.6 pg (ref 26.0–34.0)
MCHC: 33.1 g/dL (ref 30.0–36.0)
Platelets: 289 10*3/uL (ref 150–400)

## 2011-11-08 LAB — DIFFERENTIAL
Basophils Relative: 0 % (ref 0–1)
Eosinophils Absolute: 0.3 10*3/uL (ref 0.0–0.7)
Monocytes Relative: 5 % (ref 3–12)
Neutrophils Relative %: 52 % (ref 43–77)

## 2011-11-08 LAB — PREGNANCY, URINE: Preg Test, Ur: NEGATIVE

## 2011-11-08 LAB — WET PREP, GENITAL

## 2011-11-08 LAB — LIPASE, BLOOD: Lipase: 16 U/L (ref 11–59)

## 2011-11-08 MED ORDER — PROMETHAZINE HCL 25 MG PO TABS: 25.0000 mg | ORAL_TABLET | Freq: Four times a day (QID) | ORAL | Status: DC | PRN

## 2011-11-08 MED ORDER — FENTANYL CITRATE 0.05 MG/ML IJ SOLN
50.0000 ug | INTRAMUSCULAR | Status: AC | PRN
  Administered 2011-11-08 (×2): 50 ug via INTRAVENOUS
  Filled 2011-11-08 (×2): qty 2

## 2011-11-08 MED ORDER — FENTANYL CITRATE 0.05 MG/ML IJ SOLN
100.0000 ug | INTRAMUSCULAR | Status: DC | PRN
  Administered 2011-11-08: 100 ug via INTRAVENOUS
  Filled 2011-11-08: qty 2

## 2011-11-08 MED ORDER — HYDROCODONE-ACETAMINOPHEN 5-325 MG PO TABS: ORAL_TABLET | ORAL | Status: AC

## 2011-11-08 MED ORDER — IOHEXOL 300 MG/ML  SOLN
125.0000 mL | Freq: Once | INTRAMUSCULAR | Status: AC | PRN
  Administered 2011-11-08: 125 mL via INTRAVENOUS

## 2011-11-08 MED ORDER — OXYCODONE-ACETAMINOPHEN 5-325 MG PO TABS
2.0000 | ORAL_TABLET | Freq: Once | ORAL | Status: AC
  Administered 2011-11-08: 2 via ORAL
  Filled 2011-11-08: qty 2

## 2011-11-08 MED ORDER — METRONIDAZOLE 0.75 % VA GEL: 1.0000 | Freq: Every day | VAGINAL | Status: AC

## 2011-11-08 MED ORDER — ONDANSETRON HCL 4 MG/2ML IJ SOLN
4.0000 mg | INTRAMUSCULAR | Status: DC | PRN
  Administered 2011-11-08: 4 mg via INTRAVENOUS
  Filled 2011-11-08: qty 2

## 2011-11-08 MED ORDER — SODIUM CHLORIDE 0.9 % IV SOLN
INTRAVENOUS | Status: DC
  Administered 2011-11-08: 13:00:00 via INTRAVENOUS

## 2011-11-08 NOTE — ED Notes (Signed)
Pt complains of right lower abd pain since last night, she states she's had several episodes of diarrhea and vomiting

## 2011-11-08 NOTE — ED Provider Notes (Signed)
History     CSN: [id number]  Arrival date & time 11/08/11  1209   First MD Initiated Contact with Patient 11/08/11 1231      Chief Complaint  Patient presents with  . Abdominal Pain  . Diarrhea    HPI Pt was seen at 1255.  Per pt, c/o gradual onset and persistence of constant right sided abd "pain" x3-4 days, worse since yesterday.  Has been assoc with multiple intermittent episodes of N/V/D.  Denies fevers, no dysuria/hematuria, no CP/SOB, no cough, no back/flank pain, no vaginal bleeding/discharge, no rash, no black or blood in stools or emesis.   Past Medical History  Diagnosis Date  . Diabetes mellitus   . Hypertension   . Migraine headache   . Hyperlipidemia   . GERD (gastroesophageal reflux disease)   . Depression     Past Surgical History  Procedure Date  . Cholecystectomy     History  Substance Use Topics  . Smoking status: Current Everyday Smoker  . Smokeless tobacco: Not on file  . Alcohol Use: No    Review of Systems ROS: Statement: All systems negative except as marked or noted in the HPI; Constitutional: Negative for fever and chills. ; ; Eyes: Negative for eye pain, redness and discharge. ; ; ENMT: Negative for ear pain, hoarseness, nasal congestion, sinus pressure and sore throat. ; ; Cardiovascular: Negative for chest pain, palpitations, diaphoresis, dyspnea and peripheral edema. ; ; Respiratory: Negative for cough, wheezing and stridor. ; ; Gastrointestinal: +N/V/D, abd pain.  Negative for blood in stool, hematemesis, jaundice and rectal bleeding. . ; ; Genitourinary: Negative for dysuria, flank pain and hematuria. ; ; Musculoskeletal: Negative for back pain and neck pain. Negative for swelling and trauma.; GYN:  No vaginal bleeding, no vaginal discharge, no vulvar pain. ; ; Skin: Negative for pruritus, rash, abrasions, blisters, bruising and skin lesion.; ; Neuro: Negative for headache, lightheadedness and neck stiffness. Negative for weakness, altered level  of consciousness , altered mental status, extremity weakness, paresthesias, involuntary movement, seizure and syncope.     Allergies  Betadine; Imitrex; Levaquin; Penicillins; Shellfish allergy; Sulfa antibiotics; Tequin; and Tramadol  Home Medications   Current Outpatient Rx  Name Route Sig Dispense Refill  . ALPRAZOLAM 1 MG PO TABS Oral Take 1 mg by mouth 3 (three) times daily as needed. anxiety    . DULOXETINE HCL 60 MG PO CPEP Oral Take 60 mg by mouth daily.      . IBUPROFEN 200 MG PO TABS Oral Take 800 mg by mouth every 6 (six) hours as needed. pain    . LISINOPRIL-HYDROCHLOROTHIAZIDE 20-12.5 MG PO TABS Oral Take 1 tablet by mouth daily.      Marland Kitchen METOPROLOL SUCCINATE ER 50 MG PO TB24 Oral Take 50 mg by mouth daily. Take with or immediately following a meal.    . ADULT MULTIVITAMIN W/MINERALS CH Oral Take 1 tablet by mouth daily.    . TOPIRAMATE 100 MG PO TABS Oral Take 100 mg by mouth 2 (two) times daily. Take 3 tablets once daily     . TRAZODONE HCL 300 MG PO TABS Oral Take 300 mg by mouth at bedtime.      Marland Kitchen CLINDAMYCIN HCL 150 MG PO CAPS  2 Tabs by mouth TID x 5 days 30 capsule 0    BP 109/64  Pulse 90  Temp(Src) 97.8 F (36.6 C) (Oral)  Resp 18  SpO2 99%  LMP 10/24/2011  Physical Exam 1300: Physical examination:  Nursing  notes reviewed; Vital signs and O2 SAT reviewed;  Constitutional: Well developed, Well nourished, Well hydrated, In no acute distress; Head:  Normocephalic, atraumatic; Eyes: EOMI, PERRL, No scleral icterus; ENMT: Mouth and pharynx normal, Mucous membranes moist; Neck: Supple, Full range of motion, No lymphadenopathy; Cardiovascular: Regular rate and rhythm, No murmur, rub, or gallop; Respiratory: Breath sounds clear & equal bilaterally, No rales, rhonchi, wheezes, or rub, Normal respiratory effort/excursion; Chest: Nontender, Movement normal; Abdomen: Soft, +mild diffuse tenderness to palp, esp RUQ and RLQ, no rebound or guarding, Nondistended, Normal bowel  sounds; Genitourinary: No CVA tenderness; Pelvic exam performed with permission of pt and female ED tech assist during exam.  External genitalia w/o lesions. Vaginal vault with thin clear discharge.  Cervix w/o lesions, not friable, GC/chlam and wet prep obtained and sent to lab.  Bimanual exam w/o CMT, uterine, or left adenexal tenderness, +mild right adnexal tenderness .; Extremities: Pulses normal, No tenderness, No edema, No calf edema or asymmetry.; Neuro: AA&Ox3, Major CN grossly intact. Gait steady. No gross focal motor or sensory deficits in extremities.; Skin: Color normal, Warm, Dry, no rash.    ED Course  Procedures   1520:  Pt continues to c/o pain, states the "only medicine I know works on my pain is dilaudid."  States she "has a real tough time with pain" and "needs dilaudid."  Explained fentanyl is a stronger/more potent narcotic than both morphine and dilaudid, and I will give her another dose of same.  Agreeable, but then states she "is going to need some pain medicines and some phenergan for home" and "you're going to rx me some of those when I get discharged right?"  No N/V/D while in ED, has tol PO well.  Has ambulated with steady upright gait, easy resps.  Will check US pelvis.    4:31 PM:  States she "feels better" after the last dose of fentanyl.  US pelvis pending.    5:06 PM:  Neg testing today to account for pt's pain.  No N/V/D while in ED.  Has been ambulatory and has tol PO well. Dx testing d/w pt.  Questions answered.  Verb understanding, agreeable to d/c home with outpt f/u.    MDM  MDM Reviewed: nursing note and vitals Interpretation: labs, x-ray and CT scan   Results for orders placed during the hospital encounter of 11/08/11  PREGNANCY, URINE      Component Value Range   Preg Test, Ur NEGATIVE  NEGATIVE   URINALYSIS, ROUTINE W REFLEX MICROSCOPIC      Component Value Range   Color, Urine YELLOW  YELLOW    APPearance CLEAR  CLEAR    Specific Gravity, Urine  1.016  1.005 - 1.030    pH 7.0  5.0 - 8.0    Glucose, UA NEGATIVE  NEGATIVE (mg/dL)   Hgb urine dipstick NEGATIVE  NEGATIVE    Bilirubin Urine NEGATIVE  NEGATIVE    Ketones, ur NEGATIVE  NEGATIVE (mg/dL)   Protein, ur NEGATIVE  NEGATIVE (mg/dL)   Urobilinogen, UA 0.2  0.0 - 1.0 (mg/dL)   Nitrite NEGATIVE  NEGATIVE    Leukocytes, UA NEGATIVE  NEGATIVE   CBC      Component Value Range   WBC 8.9  4.0 - 10.5 (K/uL)   RBC 4.43  3.87 - 5.11 (MIL/uL)   Hemoglobin 13.1  12.0 - 15.0 (g/dL)   HCT 78.2  95.6 - 21.3 (%)   MCV 89.4  78.0 - 100.0 (fL)   MCH 29.6  26.0 - 34.0 (pg)   MCHC 33.1  30.0 - 36.0 (g/dL)   RDW 78.2  95.6 - 21.3 (%)   Platelets 289  150 - 400 (K/uL)  DIFFERENTIAL      Component Value Range   Neutrophils Relative 52  43 - 77 (%)   Neutro Abs 4.6  1.7 - 7.7 (K/uL)   Lymphocytes Relative 39  12 - 46 (%)   Lymphs Abs 3.5  0.7 - 4.0 (K/uL)   Monocytes Relative 5  3 - 12 (%)   Monocytes Absolute 0.5  0.1 - 1.0 (K/uL)   Eosinophils Relative 3  0 - 5 (%)   Eosinophils Absolute 0.3  0.0 - 0.7 (K/uL)   Basophils Relative 0  0 - 1 (%)   Basophils Absolute 0.0  0.0 - 0.1 (K/uL)  COMPREHENSIVE METABOLIC PANEL      Component Value Range   Sodium 138  135 - 145 (mEq/L)   Potassium 3.9  3.5 - 5.1 (mEq/L)   Chloride 104  96 - 112 (mEq/L)   CO2 25  19 - 32 (mEq/L)   Glucose, Bld 132 (*) 70 - 99 (mg/dL)   BUN 9  6 - 23 (mg/dL)   Creatinine, Ser 0.86  0.50 - 1.10 (mg/dL)   Calcium 9.5  8.4 - 57.8 (mg/dL)   Total Protein 7.1  6.0 - 8.3 (g/dL)   Albumin 3.5  3.5 - 5.2 (g/dL)   AST 14  0 - 37 (U/L)   ALT 17  0 - 35 (U/L)   Alkaline Phosphatase 83  39 - 117 (U/L)   Total Bilirubin 0.4  0.3 - 1.2 (mg/dL)   GFR calc non Af Amer 88 (*) >90 (mL/min)   GFR calc Af Amer >90  >90 (mL/min)  LIPASE, BLOOD      Component Value Range   Lipase 16  11 - 59 (U/L)  WET PREP, GENITAL      Component Value Range   Yeast Wet Prep HPF POC NONE SEEN  NONE SEEN    Trich, Wet Prep NONE SEEN  NONE  SEEN    Clue Cells Wet Prep HPF POC FEW (*) NONE SEEN    WBC, Wet Prep HPF POC FEW (*) NONE SEEN    Ct Abdomen Pelvis W Contrast 11/08/2011  *RADIOLOGY REPORT*  Clinical Data: Abdominal pain, nausea and vomiting.  CT ABDOMEN AND PELVIS WITH CONTRAST  Technique:  Multidetector CT imaging of the abdomen and pelvis was performed following the standard protocol during bolus administration of intravenous contrast.  Contrast: OMNIPAQUE IOHEXOL 300 MG/ML IV SOLN  Comparison: 06/18/2011.  Findings: The lung bases are clear.  The liver is unremarkable and stable.  No focal lesions or intrahepatic biliary dilatation.  The gallbladder is surgically absent.  No common bile duct dilatation.  The pancreas is unremarkable.  The spleen is normal in size.  No focal lesions. The adrenal glands and kidneys are normal.  Right-sided parapelvic cysts are noted.  The stomach, duodenum, small bowel and colon are unremarkable.  No inflammatory changes or mass lesions.  No mesenteric or retroperitoneal mass or adenopathy.  The aorta is normal in caliber.  The major branch vessels are normal.  The uterus and ovaries are unremarkable.  No pelvic mass, adenopathy or free pelvic fluid collections.  No inguinal mass or hernia.  The bony pelvis is intact.  IMPRESSION: No acute abdominal/pelvic findings, mass lesions or adenopathy.  Original Report Authenticated By: P. Loralie Champagne, M.D.  US Pelvis Complete 11/08/2011  *RADIOLOGY REPORT*  Clinical Data: Right lower quadrant pain.  Question torsion.  TRANSABDOMINAL AND TRANSVAGINAL ULTRASOUND OF PELVIS DOPPLER ULTRASOUND OF OVARIES  Technique:  Both transabdominal and transvaginal ultrasound examinations of the pelvis were performed including evaluation of the uterus, ovaries, adnexal regions, and pelvic cul-de-sac. Color and duplex Doppler ultrasound was utilized to evaluate blood flow to the ovaries.  Comparison: CT earlier today.  Findings:  Uterus: 11.8 x 5.1 x 5.4 cm.  Normal  echotexture.  No focal abnormality.  Endometrium: Normal appearance, 10 mm in thickness.  Right Ovary: 2.6 x 2.1 x 3.0 cm.  Small follicle.  Normal arterial venous blood flow documented.  No adnexal masses.  Left Ovary: Not visualized sonographically.  The left ovary was grossly unremarkable on earlier CT.  Other Findings:  No free fluid.  IMPRESSION: Left ovary was not visualized sonographically but was unremarkable on earlier CT.  No acute findings.  Original Report Authenticated By: Cyndie Chime, M.D.        Laray Anger, DO 11/09/11 [phone]

## 2011-11-08 NOTE — Discharge Instructions (Signed)
RESOURCE GUIDE  Dental Problems  Patients with Medicaid: [facility]                     [facility] [address]                                           [address] Phone:  [phone]                                                  Phone:  [phone]  If unable to pay or uninsured, contact:  [facility] or [facility] to become qualified for the adult dental clinic.  Chronic Pain Problems Contact [facility]  [phone] Patients need to be referred by their primary care doctor.  Insufficient Money for Medicine Contact [employer] Way:  call "211" or [facility] 271-5999.  No Primary Care Doctor Call [facility]  [phone] Other agencies that provide inexpensive medical care    [facility]  [phone]    [facility]  [phone]    [facility]  [phone]    Women[facility] Clinic  [phone]    [facility]  [phone]    [facility]  [phone]  Psychological Services [facility]  [phone] [facility]  [phone] [facility]   [phone] (emergency services [phone])  Substance Abuse Resources [facility]  336-882-2125 [facility] 336-784-9470 [facility] 336-285-9073 [facility] 336-845-3988 Residential & Outpatient Substance Abuse Program  800-659-3381  Abuse/Neglect [place] Child Abuse Hotline (336) 641-3795 [place] Child Abuse Hotline 800-378-5315 (After Hours)  Emergency Shelter [facility] (336) 271-5985  [facility] (336) 275-9566 [facility] (704) 372-4663  MRSA Hotline #:   832-7006    [place] [facility]  [facility] [place]     [employer]                          [facility] [address] [place]                       [address]      [address]  [place]                                                [place]                            [place] Phone:  [phone]                                   Phone:  [phone]                 Phone:  [phone]  [facility] Phone:  [phone]    [place] Child Abuse Hotline (336) 342-1394 (336) 342-3537 (After Hours)    Take the prescriptions as directed.  Increase your fluid intake (ie:  Gatoraide) for the next few days, as discussed.  Eat a bland diet and advance to your regular diet slowly as you can tolerate it.   Avoid full strength juices, as well as milk and milk products until your diarrhea has resolved.   Call your regular medical doctor today to schedule a follow up appointment this week.  Return to the Emergency Department immediately if not improving (or even worsening) despite taking the medicines as prescribed, any black or bloody stool or vomit, if you develop a fever, or for any other concerns.  

## 2011-11-09 LAB — GC/CHLAMYDIA PROBE AMP, GENITAL
Chlamydia, DNA Probe: NEGATIVE
GC Probe Amp, Genital: NEGATIVE

## 2011-12-09 ENCOUNTER — Encounter (HOSPITAL_COMMUNITY): Payer: Self-pay

## 2011-12-09 ENCOUNTER — Emergency Department (INDEPENDENT_AMBULATORY_CARE_PROVIDER_SITE_OTHER)
Admission: EM | Admit: 2011-12-09 | Discharge: 2011-12-09 | Disposition: A | Discharge status: Home / Self Care | Payer: Self-pay | Source: Home / Self Care | Attending: Emergency Medicine | Admitting: Emergency Medicine

## 2011-12-09 DIAGNOSIS — Z331 Pregnant state, incidental: Secondary | ICD-10-CM

## 2011-12-09 DIAGNOSIS — J069 Acute upper respiratory infection, unspecified: Secondary | ICD-10-CM

## 2011-12-09 MED ORDER — GUAIFENESIN ER 600 MG PO TB12: 600.0000 mg | ORAL_TABLET | Freq: Two times a day (BID) | ORAL | Status: DC

## 2011-12-09 MED ORDER — FLUTICASONE PROPIONATE 50 MCG/ACT NA SUSP: 2.0000 | Freq: Every day | NASAL | Status: DC

## 2011-12-09 MED ORDER — PRENATAL VITAMINS PLUS 27-1 MG PO TABS: 1.0000 | ORAL_TABLET | Freq: Every day | ORAL | Status: DC

## 2011-12-09 NOTE — Discharge Instructions (Signed)
Take the medication as written. You will need to talk to your psychiatrist about which medications are safe take during pregnancy. Followup with [facility] or with Dr. Christell Constant for ongoing management. Prenatal vitamins can make you very constipated. You may drink printer juice to help counteract that. Take 1 gram of tylenol  up to 4 times a day as needed for pain and fever. Drink extra fluids. Start taking / continue the mucinex to keep the mucus secretions thin. Use a neti pot or the NeilMed sinus rinse as often as you want to to reduce nasal congestion. Follow the directions on the box. Return if you get worse, have a persistent fever >100.4, or for any concerns.   Go to www.goodrx.com to look up your medications. This will give you a list of where you can find your prescriptions at the most affordable prices.   RESOURCE GUIDE   No Primary Care Doctor Call [facility]  [phone] Other agencies that provide inexpensive medical care    [facility]  [phone]    [facility]  [phone]    [facility]  [phone]    [facility]  [phone] [address] [place] [address]    [facility]  [phone] [facility] [phone]   [address], [place]. [address] [place], [place] [address]   [facility] Resources  [facility] [place]     [employer]                          [facility] [address] [place]                       [address]      [address] [place] [address]   [phone] (After Hours)  Go to www.goodrx.com to look up your medications. This will give you a list of where you can find your prescriptions at the most affordable prices.

## 2011-12-09 NOTE — ED Provider Notes (Signed)
History     CSN: [id number]  Arrival date & time 12/09/11  0808   First MD Initiated Contact with Patient 12/09/11 0820      Chief Complaint  Patient presents with  . Cough  . Fever    (Consider location/radiation/quality/duration/timing/severity/associated sxs/prior treatment) HPI Comments: Pt with rhinorrhea, postnasal drip, ST, cough productive of greenish sputum in the morning, fatigue x 5 days. States feels "hot" but no measured fevers at home. Reports some nausea, malaise. No ear pain,  wheeze, SOB, abd pain, rash.  No sinus pressure/pain, no urinary complaints. Slightly decreased appetite but is tolerating po. Has been taking ibuprofen without much relief. Patient also states that she is several weeks late for her menses. Patient has not checked a home pregnancy test.   ROS as noted in HPI. All other ROS negative.      Patient is a 36 y.o. female presenting with URI. The history is provided by the patient. No language interpreter was used.  URI The primary symptoms include cough. The current episode started 3 to 5 days ago. This is a new problem. The problem has not changed since onset. Symptoms associated with the illness include congestion and rhinorrhea. The illness is not associated with chills, plugged ear sensation, facial pain or sinus pressure. The following treatments were addressed: NSAIDs were effective.    Past Medical History  Diagnosis Date  . Diabetes mellitus   . Hypertension   . Migraine headache   . Hyperlipidemia   . GERD (gastroesophageal reflux disease)   . Depression   . Asthma     Past Surgical History  Procedure Date  . Cholecystectomy     History reviewed. No pertinent family history.  History  Substance Use Topics  . Smoking status: Current Everyday Smoker  . Smokeless tobacco: Not on file  . Alcohol Use: No    OB History    Grav Para Term Preterm Abortions TAB SAB Ect Mult Living                  Review of Systems    Constitutional: Negative for chills.  HENT: Positive for congestion and rhinorrhea. Negative for sinus pressure.   Respiratory: Positive for cough.     Allergies  Betadine; Imitrex; Levaquin; Penicillins; Shellfish allergy; Sulfa antibiotics; Tequin; and Tramadol  Home Medications   Current Outpatient Rx  Name Route Sig Dispense Refill  . ADULT MULTIVITAMIN W/MINERALS CH Oral Take 1 tablet by mouth daily.    Marland Kitchen OMEPRAZOLE 10 MG PO CPDR Oral Take 10 mg by mouth daily.    Marland Kitchen PROMETHAZINE HCL 25 MG PO TABS Oral Take 25 mg by mouth every 6 (six) hours as needed.    Marland Kitchen RANITIDINE HCL 150 MG PO TABS Oral Take 150 mg by mouth 2 (two) times daily.    Marland Kitchen FLUTICASONE PROPIONATE 50 MCG/ACT NA SUSP Nasal Place 2 sprays into the nose daily. 16 g 0  . GUAIFENESIN ER 600 MG PO TB12 Oral Take 1 tablet (600 mg total) by mouth 2 (two) times daily. 14 tablet 0  . PRENATAL VITAMINS PLUS 27-1 MG PO TABS Oral Take 1 tablet by mouth daily. 1 tab po once daily 30 tablet 0    BP 138/96  Pulse 86  Temp(Src) 97.7 F (36.5 C) (Oral)  Resp 18  SpO2 100%  LMP 10/27/2011  Physical Exam  Nursing note and vitals reviewed. Constitutional: She is oriented to person, place, and time. She appears well-developed and well-nourished.  HENT:  Head: Normocephalic and atraumatic.  Right Ear: Tympanic membrane and ear canal normal.  Left Ear: Tympanic membrane and ear canal normal.  Nose: Mucosal edema and rhinorrhea present. No epistaxis.  Mouth/Throat: Uvula is midline and mucous membranes are normal. Posterior oropharyngeal erythema present. No oropharyngeal exudate.       (-) frontal, maxillary sinus tenderness  Eyes: Conjunctivae and EOM are normal.  Neck: Normal range of motion. Neck supple.  Cardiovascular: Normal rate, regular rhythm, normal heart sounds and intact distal pulses.   Pulmonary/Chest: Effort normal and breath sounds normal. She has no rales.  Abdominal: Soft. She exhibits no distension. There is  no tenderness. There is no rebound and no guarding.  Musculoskeletal: Normal range of motion.  Lymphadenopathy:    She has no cervical adenopathy.  Neurological: She is alert and oriented to person, place, and time.  Skin: Skin is warm and dry. No rash noted.  Psychiatric: She has a normal mood and affect. Her behavior is normal. Judgment and thought content normal.    ED Course  Procedures (including critical care time)  Labs Reviewed  POCT PREGNANCY, URINE - Abnormal; Notable for the following:    Preg Test, Ur POSITIVE (*)    All other components within normal limits   No results found.   1. URI (upper respiratory infection)   2. Pregnancy as incidental finding      MDM  Patient with URI symptoms for 5 days. No indications for abx at this time. Discussed this with patient. Discussed positive pregnancy test with patient. Patient denies abdominal pain, vaginal bleeding. Patient is 6 weeks 0 days by LMP. Gave her a list of medications that is safe in pregnancy. Discussed with patient that she will need to talk to her  psychiatrist for medication adjustment, as many of her medications are contraindicated. Will refer her to [facility], or to OB/GYN on call,  Dr. Christell Constant.   Luiz Blare, MD 12/09/11 1438

## 2011-12-09 NOTE — ED Notes (Signed)
C/o productive cough of green sputum, fever, sore throat, chills and rt ear pain since Saturday.

## 2011-12-25 ENCOUNTER — Inpatient Hospital Stay (HOSPITAL_COMMUNITY): Payer: Medicaid Other

## 2011-12-25 ENCOUNTER — Inpatient Hospital Stay (HOSPITAL_COMMUNITY)
Admission: AD | Admit: 2011-12-25 | Discharge: 2011-12-25 | Disposition: A | Discharge status: Home / Self Care | Payer: Medicaid Other | Source: Ambulatory Visit | Attending: Obstetrics and Gynecology | Admitting: Obstetrics and Gynecology

## 2011-12-25 ENCOUNTER — Encounter (HOSPITAL_COMMUNITY): Payer: Self-pay | Admitting: Obstetrics and Gynecology

## 2011-12-25 DIAGNOSIS — R1032 Left lower quadrant pain: Secondary | ICD-10-CM | POA: Insufficient documentation

## 2011-12-25 DIAGNOSIS — Z3490 Encounter for supervision of normal pregnancy, unspecified, unspecified trimester: Secondary | ICD-10-CM

## 2011-12-25 DIAGNOSIS — O99891 Other specified diseases and conditions complicating pregnancy: Secondary | ICD-10-CM | POA: Insufficient documentation

## 2011-12-25 DIAGNOSIS — Z1389 Encounter for screening for other disorder: Secondary | ICD-10-CM

## 2011-12-25 LAB — CBC
MCH: 29.8 pg (ref 26.0–34.0)
MCV: 90 fL (ref 78.0–100.0)
Platelets: 266 10*3/uL (ref 150–400)
RBC: 4.5 MIL/uL (ref 3.87–5.11)
RDW: 12.9 % (ref 11.5–15.5)
WBC: 13.2 10*3/uL — ABNORMAL HIGH (ref 4.0–10.5)

## 2011-12-25 LAB — ABO/RH: ABO/RH(D): O POS

## 2011-12-25 LAB — DIFFERENTIAL
Basophils Absolute: 0 10*3/uL (ref 0.0–0.1)
Eosinophils Absolute: 0.3 10*3/uL (ref 0.0–0.7)
Eosinophils Relative: 2 % (ref 0–5)
Lymphocytes Relative: 29 % (ref 12–46)
Lymphs Abs: 3.8 10*3/uL (ref 0.7–4.0)
Neutrophils Relative %: 64 % (ref 43–77)

## 2011-12-25 LAB — HCG, QUANTITATIVE, PREGNANCY: hCG, Beta Chain, Quant, S: 48301 m[IU]/mL — ABNORMAL HIGH (ref <5)

## 2011-12-25 NOTE — MAU Note (Signed)
Pt presents to MAU with chief complaint of Left sided abdominal pain. Pt is [redacted]w[redacted]d; pregnancy confirmed at urgent care. Pt says the pain woke up up last night and stayed constant all day long; dull, achy. Pt has an appointment with Dr. Arlyce Dice on the 19th of this month; no prenatal care otherwise.

## 2011-12-25 NOTE — MAU Provider Note (Signed)
Attestation of Attending Supervision of Advanced Practitioner: Evaluation and management procedures were performed by the [facility] Fellow/PA/CNM/NP under my supervision and collaboration. Chart reviewed, and agree with management and plan.  Jaynie Collins, M.D. 12/25/2011 7:09 PM

## 2011-12-25 NOTE — MAU Provider Note (Signed)
History     CSN: [id number]  Arrival date & time 12/25/11  1552   None     Chief Complaint  Patient presents with  . Abdominal Pain   HPI [name] is a 36 y.o. female @ [redacted]w[redacted]d gestation who presents to MAU for pain in early pregnancy. [facility] Has not started prenatal care but plans to call [facility]. Patient states that she wanted to be sure she didn't have an ectopic pregnancy. The history was provided by the patient.  Past Medical History  Diagnosis Date  . Diabetes mellitus   . Hypertension   . Migraine headache   . Hyperlipidemia   . GERD (gastroesophageal reflux disease)   . Depression   . Asthma     Past Surgical History  Procedure Date  . Cholecystectomy     History reviewed. No pertinent family history.  History  Substance Use Topics  . Smoking status: Current Everyday Smoker -- 0.2 packs/day  . Smokeless tobacco: Not on file  . Alcohol Use: No    OB History    Grav Para Term Preterm Abortions TAB SAB Ect Mult Living   3 1 1  0 1 0 1 0 0 1      Review of Systems  Constitutional: Negative for fever, chills, diaphoresis and fatigue.  HENT: Negative for ear pain, congestion, sore throat, facial swelling, neck pain, neck stiffness, dental problem and sinus pressure.   Eyes: Negative for photophobia, pain and discharge.  Respiratory: Negative for cough, chest tightness and wheezing.   Gastrointestinal: Positive for abdominal pain. Negative for nausea, vomiting, diarrhea, constipation and abdominal distention.  Genitourinary: Positive for frequency and vaginal discharge. Negative for dysuria, flank pain, vaginal bleeding and difficulty urinating.  Musculoskeletal: Negative for myalgias, back pain and gait problem.  Skin: Negative for color change and rash.  Neurological: Negative for dizziness, speech difficulty, weakness, light-headedness, numbness and headaches.  Psychiatric/Behavioral: Negative for confusion and  agitation.    Allergies  Betadine; Imitrex; Levaquin; Penicillins; Shellfish allergy; Sulfa antibiotics; Tequin; and Tramadol  Home Medications  No current outpatient prescriptions on file.  BP 127/76  Pulse 81  Resp 18  LMP 10/24/2011  Physical Exam  Nursing note and vitals reviewed. Constitutional: She is oriented to person, place, and time. No distress.       Obese W/F  HENT:  Head: Normocephalic.  Eyes: EOM are normal.  Neck: Neck supple.  Cardiovascular: Normal rate.   Pulmonary/Chest: Effort normal.  Abdominal: Soft. There is tenderness in the left lower quadrant. There is no rigidity, no rebound and no guarding.       Tenderness is mild  Genitourinary:       External genitalia without lesions. Cervix closed, no CMT, tender left adnexa. Uterus approximately 8 week size.  Musculoskeletal: Normal range of motion.  Neurological: She is alert and oriented to person, place, and time. No cranial nerve deficit.  Skin: Skin is warm and dry.  Psychiatric: She has a normal mood and affect. Her behavior is normal. Judgment and thought content normal.   Results for orders placed during the hospital encounter of 12/25/11 (from the past 24 hour(s))  CBC     Status: Abnormal   Collection Time   12/25/11  4:36 PM      Component Value Range   WBC 13.2 (*) 4.0 - 10.5 (K/uL)   RBC 4.50  3.87 - 5.11 (MIL/uL)   Hemoglobin 13.4  12.0 - 15.0 (g/dL)  HCT 40.5  36.0 - 46.0 (%)   MCV 90.0  78.0 - 100.0 (fL)   MCH 29.8  26.0 - 34.0 (pg)   MCHC 33.1  30.0 - 36.0 (g/dL)   RDW 19.1  47.8 - 29.5 (%)   Platelets 266  150 - 400 (K/uL)  DIFFERENTIAL     Status: Abnormal   Collection Time   12/25/11  4:36 PM      Component Value Range   Neutrophils Relative 64  43 - 77 (%)   Neutro Abs 8.4 (*) 1.7 - 7.7 (K/uL)   Lymphocytes Relative 29  12 - 46 (%)   Lymphs Abs 3.8  0.7 - 4.0 (K/uL)   Monocytes Relative 6  3 - 12 (%)   Monocytes Absolute 0.7  0.1 - 1.0 (K/uL)   Eosinophils Relative 2  0 - 5  (%)   Eosinophils Absolute 0.3  0.0 - 0.7 (K/uL)   Basophils Relative 0  0 - 1 (%)   Basophils Absolute 0.0  0.0 - 0.1 (K/uL)  HCG, QUANTITATIVE, PREGNANCY     Status: Abnormal   Collection Time   12/25/11  4:36 PM      Component Value Range   hCG, Beta Chain, Quant, S 62130 (*) <5 (mIU/mL)  ABO/RH     Status: Normal   Collection Time   12/25/11  4:37 PM      Component Value Range   ABO/RH(D) O POS     US Ob Transvaginal  12/25/2011  *RADIOLOGY REPORT*  Clinical Data: First trimester pregnancy.  Left lower quadrant pelvic pain.  TRANSVAGINAL OB ULTRASOUND  Technique:  Transvaginal ultrasound was performed for evaluation of the gestation as well as the maternal uterus and adnexal regions.  Comparison: 11/08/2011  Findings: A single intrauterine gestational sac noted with visible embryo and visible yolk sac.  Crown-rump length is 1.7 cm compatible with 8 weeks 2 days gestation.  Embryonic cardiac activity is present at 150 beats per minute.  A trace amount of subchorionic hemorrhage is noted.  Right corpus luteum is seen.  Both ovaries appear normal.  No free pelvic fluid is observed.  IMPRESSION:  1.  Single living intrauterine pregnancy measuring at 8 weeks 2 days gestation, with cardiac activity at 150 beats per minute. 2.  Trace amount of subchorionic hemorrhage.  Original Report Authenticated By: Dellia Cloud, M.D.   Assessment: Viable IUP at 8 weeks and 2 days gestation   Left lower quadrant abdominal pain  Plan:  Start prenatal care as planned   Tylenol for pain   Return as needed. ED Course  Procedures   MDM

## 2012-01-19 ENCOUNTER — Encounter: Payer: Medicaid Other | Attending: Obstetrics & Gynecology | Admitting: Dietician

## 2012-01-19 ENCOUNTER — Encounter: Payer: Self-pay | Admitting: Dietician

## 2012-01-19 DIAGNOSIS — E119 Type 2 diabetes mellitus without complications: Secondary | ICD-10-CM | POA: Insufficient documentation

## 2012-01-19 DIAGNOSIS — Z713 Dietary counseling and surveillance: Secondary | ICD-10-CM | POA: Insufficient documentation

## 2012-01-19 LAB — OB RESULTS CONSOLE RPR: RPR: NONREACTIVE

## 2012-01-19 LAB — OB RESULTS CONSOLE HEPATITIS B SURFACE ANTIGEN: Hepatitis B Surface Ag: NEGATIVE

## 2012-01-19 NOTE — Patient Instructions (Signed)
   Omit the concentrated sweets.  Monitor carbohydrate intake and count carbs.  Do three meals and 3 snacks each day.  Monitor blood glucose 4 times per day.

## 2012-01-20 ENCOUNTER — Inpatient Hospital Stay (HOSPITAL_COMMUNITY)
Admission: AD | Admit: 2012-01-20 | Discharge: 2012-01-20 | Disposition: A | Discharge status: Home / Self Care | Payer: Medicaid Other | Source: Ambulatory Visit | Attending: Obstetrics and Gynecology | Admitting: Obstetrics and Gynecology

## 2012-01-20 ENCOUNTER — Encounter: Payer: Self-pay | Admitting: Dietician

## 2012-01-20 ENCOUNTER — Encounter (HOSPITAL_COMMUNITY): Payer: Self-pay | Admitting: *Deleted

## 2012-01-20 DIAGNOSIS — O24919 Unspecified diabetes mellitus in pregnancy, unspecified trimester: Secondary | ICD-10-CM | POA: Insufficient documentation

## 2012-01-20 DIAGNOSIS — R739 Hyperglycemia, unspecified: Secondary | ICD-10-CM

## 2012-01-20 DIAGNOSIS — E119 Type 2 diabetes mellitus without complications: Secondary | ICD-10-CM | POA: Insufficient documentation

## 2012-01-20 DIAGNOSIS — O26899 Other specified pregnancy related conditions, unspecified trimester: Secondary | ICD-10-CM

## 2012-01-20 DIAGNOSIS — R7309 Other abnormal glucose: Secondary | ICD-10-CM

## 2012-01-20 LAB — COMPREHENSIVE METABOLIC PANEL
ALT: 25 U/L (ref 0–35)
AST: 16 U/L (ref 0–37)
Albumin: 3.2 g/dL — ABNORMAL LOW (ref 3.5–5.2)
Calcium: 10.2 mg/dL (ref 8.4–10.5)
Creatinine, Ser: 0.74 mg/dL (ref 0.50–1.10)
GFR calc non Af Amer: >90 mL/min (ref >90)
Sodium: 136 mEq/L (ref 135–145)
Total Protein: 6.9 g/dL (ref 6.0–8.3)

## 2012-01-20 LAB — URINALYSIS, ROUTINE W REFLEX MICROSCOPIC
Bilirubin Urine: NEGATIVE
Hgb urine dipstick: NEGATIVE
Nitrite: NEGATIVE
Protein, ur: NEGATIVE mg/dL
Specific Gravity, Urine: >1.03 — ABNORMAL HIGH (ref 1.005–1.030)
Urobilinogen, UA: 0.2 mg/dL (ref 0.0–1.0)

## 2012-01-20 LAB — CBC
MCH: 29.3 pg (ref 26.0–34.0)
MCHC: 32.7 g/dL (ref 30.0–36.0)
MCV: 89.4 fL (ref 78.0–100.0)
Platelets: 255 10*3/uL (ref 150–400)
RBC: 4.34 MIL/uL (ref 3.87–5.11)
RDW: 12.5 % (ref 11.5–15.5)

## 2012-01-20 MED ORDER — DEXAMETHASONE SODIUM PHOSPHATE 10 MG/ML IJ SOLN
10.0000 mg | INTRAMUSCULAR | Status: AC
  Administered 2012-01-20: 10 mg via INTRAVENOUS
  Filled 2012-01-20: qty 1

## 2012-01-20 MED ORDER — METOCLOPRAMIDE HCL 5 MG/ML IJ SOLN
10.0000 mg | INTRAMUSCULAR | Status: AC
  Administered 2012-01-20: 10 mg via INTRAVENOUS
  Filled 2012-01-20: qty 2

## 2012-01-20 MED ORDER — LACTATED RINGERS IV BOLUS (SEPSIS)
1000.0000 mL | Freq: Once | INTRAVENOUS | Status: AC
  Administered 2012-01-20: 1000 mL via INTRAVENOUS

## 2012-01-20 MED ORDER — DIPHENHYDRAMINE HCL 50 MG/ML IJ SOLN
25.0000 mg | INTRAMUSCULAR | Status: AC
  Administered 2012-01-20: 25 mg via INTRAVENOUS
  Filled 2012-01-20: qty 1

## 2012-01-20 NOTE — MAU Note (Signed)
L. Leftwich-Kirby CNM in to see pt. 

## 2012-01-20 NOTE — Progress Notes (Signed)
  Patient was seen on 01/19/2012 for Diabetes and Pregnancy, seen 1:1 for management of the diabetes during pregnancy.  Prior to becoming pregnant was not on any diabetes medications.  After her initial diagnosis, she lost a number of pounds and did not have to take the Metformin.  She is now [redacted] weeks pregnant and experiencing hyperglycemia.  She is currently on insulin but is unsure of the type of insulin she is taking at bedtime.  Today, we reviewed the physiology and the dietary recommendations and the monitoring process and goals..  The following learning objectives were met by the patient during this session:   States why dietary management is important in controlling blood glucose  Describes the effects each nutrient has on blood glucose levels  Demonstrates ability to create a balanced meal plan  Demonstrates carbohydrate counting   States when to check blood glucose levels  Demonstrates proper blood glucose monitoring techniques  States the effect of stress and exercise on blood glucose levels  States the importance of limiting caffeine and abstaining from alcohol and smoking  Blood glucose monitor given: Accu-Chek SmartView Meter Kit (will need an order for Accu-Chek SmartView Strips and Accu-Chek FastClix Lancets) Lot # [id number] Exp: 02/17/2013 Blood glucose reading: 170 at 6:00 PM  Patient instructed to monitor glucose levels:Fasting and 2 hrs after the first bite of each meal. FBS: 60 - <90 2 hour: <120  Patient received handouts:  Nutrition Diabetes and Pregnancy  Carbohydrate Counting List  Patient will be seen for follow-up as needed.

## 2012-01-20 NOTE — MAU Note (Signed)
Diabetic snack given.

## 2012-01-20 NOTE — MAU Provider Note (Signed)
History     CSN: [id number]  Arrival date and time: 01/20/12 1634   None    35 y.o.G3P1011@[redacted]w[redacted]d  Chief Complaint  Patient presents with  . poor controlled diabetis    HPI Pt presents to MAU with severe h/a, starting 5 days ago and worsening today, dizziness on and off today, and 2 elevated blood glucose values of 250 and 300 today and yesterday.  She started taking insulin, Humulin N 10 units @ bedtime on Thursday per Dr Arlyce Dice, with a plan to follow up on Tuesday for adjustment to medication.  She has a hx of migraine headaches and this feels similar, but worse, according to pt.  She denies uterine cramping, LOF, vaginal bleeding, vaginal itching/burning, urinary symptoms, n/v, or fever/chills.    OB History    Grav Para Term Preterm Abortions TAB SAB Ect Mult Living   3 1 1  0 1 0 1 0 0 1      Past Medical History  Diagnosis Date  . Diabetes mellitus   . Hypertension   . Migraine headache   . Hyperlipidemia   . GERD (gastroesophageal reflux disease)   . Depression   . Asthma     Past Surgical History  Procedure Date  . Cholecystectomy   . Wisdom tooth extraction     Family History  Problem Relation Age of Onset  . Heart disease Mother   . COPD Mother   . Hypertension Mother   . Hyperlipidemia Mother   . Hearing loss Maternal Grandmother     History  Substance Use Topics  . Smoking status: Current Everyday Smoker -- 0.2 packs/day  . Smokeless tobacco: Never Used  . Alcohol Use: No    Allergies:  Allergies  Allergen Reactions  . Betadine (Povidone Iodine) Anaphylaxis  . Imitrex (Sumatriptan Base) Anaphylaxis  . Levofloxacin Anaphylaxis  . Penicillins Anaphylaxis  . Shellfish Allergy Anaphylaxis  . Sulfa Antibiotics Anaphylaxis  . Tequin Anaphylaxis  . Tramadol Anaphylaxis    Prescriptions prior to admission  Medication Sig Dispense Refill  . acetaminophen (TYLENOL) 500 MG tablet Take 1,000 mg by mouth every 6 (six) hours as needed. pain      .  cetirizine (ZYRTEC) 10 MG tablet Take 10 mg by mouth daily.      . folic acid (FOLVITE) 800 MCG tablet Take 800 mcg by mouth 2 (two) times daily.      Marland Kitchen omeprazole (PRILOSEC) 20 MG capsule Take 20 mg by mouth 2 (two) times daily.      . ondansetron (ZOFRAN) 4 MG tablet Take 4 mg by mouth every 8 (eight) hours as needed. nausea      . Prenatal Vit-Fe Fumarate-FA (PRENATAL MULTIVITAMIN) TABS Take 1 tablet by mouth every morning.       . ranitidine (ZANTAC) 150 MG tablet Take 150 mg by mouth 2 (two) times daily.        Review of Systems  Constitutional: Negative for fever, chills and malaise/fatigue.  Eyes: Negative for blurred vision.  Respiratory: Negative for cough and shortness of breath.   Cardiovascular: Negative for chest pain.  Gastrointestinal: Negative for heartburn, nausea and vomiting.  Genitourinary: Negative for dysuria, urgency and frequency.  Musculoskeletal: Negative.   Neurological: Positive for dizziness and headaches.  Psychiatric/Behavioral: Negative for depression.   Physical Exam   Blood pressure 132/83, pulse 91, temperature 98.8 F (37.1 C), temperature source Oral, resp. rate 20, height 5\' 9"  (1.753 m), weight 119.296 kg (263 lb), last menstrual period 10/24/2011.  Physical  Exam  Nursing note and vitals reviewed. Constitutional: She is oriented to person, place, and time. She appears well-developed and well-nourished.  Neck: Normal range of motion.  Cardiovascular: Normal rate, regular rhythm and normal heart sounds.   Respiratory: Effort normal.  GI: Soft.  Musculoskeletal: Normal range of motion.  Neurological: She is alert and oriented to person, place, and time. She has normal reflexes.  Skin: Skin is warm and dry.  Psychiatric: She has a normal mood and affect. Her behavior is normal. Judgment and thought content normal.   Results for orders placed during the hospital encounter of 01/20/12 (from the past 24 hour(s))  URINALYSIS, ROUTINE W REFLEX  MICROSCOPIC     Status: Abnormal   Collection Time   01/20/12  5:20 PM      Component Value Range   Color, Urine YELLOW  YELLOW    APPearance CLOUDY (*) CLEAR    Specific Gravity, Urine >1.030 (*) 1.005 - 1.030    pH 5.5  5.0 - 8.0    Glucose, UA 500 (*) NEGATIVE (mg/dL)   Hgb urine dipstick NEGATIVE  NEGATIVE    Bilirubin Urine NEGATIVE  NEGATIVE    Ketones, ur NEGATIVE  NEGATIVE (mg/dL)   Protein, ur NEGATIVE  NEGATIVE (mg/dL)   Urobilinogen, UA 0.2  0.0 - 1.0 (mg/dL)   Nitrite NEGATIVE  NEGATIVE    Leukocytes, UA TRACE (*) NEGATIVE   URINE MICROSCOPIC-ADD ON     Status: Abnormal   Collection Time   01/20/12  5:20 PM      Component Value Range   Squamous Epithelial / LPF MANY (*) RARE    WBC, UA 0-2  <3 (WBC/hpf)   Bacteria, UA FEW (*) RARE    Urine-Other MUCOUS PRESENT    CBC     Status: Abnormal   Collection Time   01/20/12  5:52 PM      Component Value Range   WBC 11.8 (*) 4.0 - 10.5 (K/uL)   RBC 4.34  3.87 - 5.11 (MIL/uL)   Hemoglobin 12.7  12.0 - 15.0 (g/dL)   HCT 16.1  09.6 - 04.5 (%)   MCV 89.4  78.0 - 100.0 (fL)   MCH 29.3  26.0 - 34.0 (pg)   MCHC 32.7  30.0 - 36.0 (g/dL)   RDW 40.9  81.1 - 91.4 (%)   Platelets 255  150 - 400 (K/uL)  COMPREHENSIVE METABOLIC PANEL     Status: Abnormal   Collection Time   01/20/12  5:52 PM      Component Value Range   Sodium 136  135 - 145 (mEq/L)   Potassium 4.5  3.5 - 5.1 (mEq/L)   Chloride 104  96 - 112 (mEq/L)   CO2 23  19 - 32 (mEq/L)   Glucose, Bld 157 (*) 70 - 99 (mg/dL)   BUN 13  6 - 23 (mg/dL)   Creatinine, Ser 7.82  0.50 - 1.10 (mg/dL)   Calcium 95.6  8.4 - 10.5 (mg/dL)   Total Protein 6.9  6.0 - 8.3 (g/dL)   Albumin 3.2 (*) 3.5 - 5.2 (g/dL)   AST 16  0 - 37 (U/L)   ALT 25  0 - 35 (U/L)   Alkaline Phosphatase 70  39 - 117 (U/L)   Total Bilirubin 0.2 (*) 0.3 - 1.2 (mg/dL)   GFR calc non Af Amer >90  >90 (mL/min)   GFR calc Af Amer >90  >90 (mL/min)   MAU Course  Procedures  MDM IV LR bolus,  Decadron 10 mg IV,  Benadryl 25 mg IV, Reglan 10 mg IV  Assessment and Plan  Called Dr Claiborne Billings about assessment and results. D/C home Call Dr Delray Alt office in am and make appointment tomorrow Return to MAU as needed  LEFTWICH-KIRBY, Shontez Sermon 01/20/2012, 8:08 PM

## 2012-01-20 NOTE — Discharge Instructions (Signed)
Diabetes Meal Planning Guide The diabetes meal planning guide is a tool to help you plan your meals and snacks. It is important for people with diabetes to manage their blood glucose (sugar) levels. Choosing the right foods and the right amounts throughout your day will help control your blood glucose. Eating right can even help you improve your blood pressure and reach or maintain a healthy weight. CARBOHYDRATE COUNTING MADE EASY When you eat carbohydrates, they turn to sugar. This raises your blood glucose level. Counting carbohydrates can help you control this level so you feel better. When you plan your meals by counting carbohydrates, you can have more flexibility in what you eat and balance your medicine with your food intake. Carbohydrate counting simply means adding up the total amount of carbohydrate grams in your meals and snacks. Try to eat about the same amount at each meal. Foods with carbohydrates are listed below. Each portion below is 1 carbohydrate serving or 15 grams of carbohydrates. Ask your dietician how many grams of carbohydrates you should eat at each meal or snack. Grains and Starches  1 slice bread.    English muffin or hotdog/hamburger bun.    cup cold cereal (unsweetened).   ? cup cooked pasta or rice.    cup starchy vegetables (corn, potatoes, peas, beans, winter squash).   1 tortilla (6 inches).    bagel.   1 waffle or pancake (size of a CD).    cup cooked cereal.   4 to 6 small crackers.  *Whole grain is recommended. Fruit  1 cup fresh unsweetened berries, melon, papaya, pineapple.   1 small fresh fruit.    banana or mango.    cup fruit juice (4 oz unsweetened).    cup canned fruit in natural juice or water.   2 tbs dried fruit.   12 to 15 grapes or cherries.  Milk and Yogurt  1 cup fat-free or 1% milk.   1 cup soy milk.   6 oz light yogurt with sugar-free sweetener.   6 oz low-fat soy yogurt.   6 oz plain yogurt.   Vegetables  1 cup raw or  cup cooked is counted as 0 carbohydrates or a "free" food.   If you eat 3 or more servings at 1 meal, count them as 1 carbohydrate serving.  Other Carbohydrates   oz chips or pretzels.    cup ice cream or frozen yogurt.    cup sherbet or sorbet.   2 inch square cake, no frosting.   1 tbs honey, sugar, jam, jelly, or syrup.   2 small cookies.   3 squares of graham crackers.   3 cups popcorn.   6 crackers.   1 cup broth-based soup.   Count 1 cup casserole or other mixed foods as 2 carbohydrate servings.   Foods with less than 20 calories in a serving may be counted as 0 carbohydrates or a "free" food.  You may want to purchase a book or computer software that lists the carbohydrate gram counts of different foods. In addition, the nutrition facts panel on the labels of the foods you eat are a good source of this information. The label will tell you how big the serving size is and the total number of carbohydrate grams you will be eating per serving. Divide this number by 15 to obtain the number of carbohydrate servings in a portion. Remember, 1 carbohydrate serving equals 15 grams of carbohydrate. SERVING SIZES Measuring foods and serving sizes helps you   make sure you are getting the right amount of food. The list below tells how big or small some common serving sizes are.  1 oz.........4 stacked dice.   3 oz........Marland KitchenDeck of cards.   1 tsp.......Marland KitchenTip of little finger.   1 tbs......Marland KitchenMarland KitchenThumb.   2 tbs.......Marland KitchenGolf ball.    cup......Marland KitchenHalf of a fist.   1 cup.......Marland KitchenA fist.  SAMPLE DIABETES MEAL PLAN Below is a sample meal plan that includes foods from the grain and starches, dairy, vegetable, fruit, and meat groups. A dietician can individualize a meal plan to fit your calorie needs and tell you the number of servings needed from each food group. However, controlling the total amount of carbohydrates in your meal or snack is more important than  making sure you include all of the food groups at every meal. You may interchange carbohydrate containing foods (dairy, starches, and fruits). The meal plan below is an example of a 2000 calorie diet using carbohydrate counting. This meal plan has 17 carbohydrate servings. Breakfast  1 cup oatmeal (2 carb servings).    cup light yogurt (1 carb serving).   1 cup blueberries (1 carb serving).    cup almonds.  Snack  1 large apple (2 carb servings).   1 low-fat string cheese stick.  Lunch  Chicken breast salad.   1 cup spinach.    cup chopped tomatoes.   2 oz chicken breast, sliced.   2 tbs low-fat [place] dressing.   12 whole-wheat crackers (2 carb servings).   12 to 15 grapes (1 carb serving).   1 cup low-fat milk (1 carb serving).  Snack  1 cup carrots.    cup hummus (1 carb serving).  Dinner  3 oz broiled salmon.   1 cup brown rice (3 carb servings).  Snack  1  cups steamed broccoli (1 carb serving) drizzled with 1 tsp olive oil and lemon juice.   1 cup light pudding (2 carb servings).  DIABETES MEAL PLANNING WORKSHEET Your dietician can use this worksheet to help you decide how many servings of foods and what types of foods are right for you.  BREAKFAST Food Group and Servings / Carb Servings Grain/Starches __________________________________ Dairy __________________________________________ Vegetable ______________________________________ Fruit ___________________________________________ Meat __________________________________________ Fat ____________________________________________ LUNCH Food Group and Servings / Carb Servings Grain/Starches ___________________________________ Dairy ___________________________________________ Fruit ____________________________________________ Meat ___________________________________________ Fat _____________________________________________ Laural Golden Food Group and Servings / Carb Servings Grain/Starches  ___________________________________ Dairy ___________________________________________ Fruit ____________________________________________ Meat ___________________________________________ Fat _____________________________________________ SNACKS Food Group and Servings / Carb Servings Grain/Starches ___________________________________ Dairy ___________________________________________ Vegetable _______________________________________ Fruit ____________________________________________ Meat ___________________________________________ Fat _____________________________________________ DAILY TOTALS Starches _________________________ Vegetable ________________________ Fruit ____________________________ Dairy ____________________________ Meat ____________________________ Fat ______________________________ Document Released: 06/03/2005 Document Revised: 08/26/2011 Document Reviewed: 04/14/2009 [employer] Patient Information 2012 [place], [place]. Migraine Headache A migraine headache is an intense, throbbing pain on one or both sides of your head. The exact cause of a migraine headache is not always known. A migraine may be caused when nerves in the brain become irritated and release chemicals that cause swelling within blood vessels, causing pain. Many migraine sufferers have a family history of migraines. Before you get a migraine you may or may not get an aura. An aura is a group of symptoms that can predict the beginning of a migraine. An aura may include:  Visual changes such as:   Flashing lights.   Bright spots or zig-zag lines.   Tunnel vision.   Feelings of numbness.   Trouble talking.   Muscle weakness.  SYMPTOMS  Pain  on one or both sides of your head.   Pain that is pulsating or throbbing in nature.   Pain that is severe enough to prevent daily activities.   Pain that is aggravated by any daily physical activity.   Nausea (feeling sick to your stomach), vomiting, or both.    Pain with exposure to bright lights, loud noises, or activity.   General sensitivity to bright lights or loud noises.  MIGRAINE TRIGGERS Examples of triggers of migraine headaches include:   Alcohol.   Smoking.   Stress.   It may be related to menses (female menstruation).   Aged cheeses.   Foods or drinks that contain nitrates, glutamate, aspartame, or tyramine.   Lack of sleep.   Chocolate.   Caffeine.   Hunger.   Medications such as nitroglycerine (used to treat chest pain), birth control pills, estrogen, and some blood pressure medications.  DIAGNOSIS  A migraine headache is often diagnosed based on:  Symptoms.   Physical examination.   A computerized X-ray scan (computed tomography, CT) of your head.  TREATMENT  Medications can help prevent migraines if they are recurrent or should they become recurrent. Your caregiver can help you with a medication or treatment program that will be helpful to you.   Lying down in a dark, quiet room may be helpful.   Keeping a headache diary may help you find a trend as to what may be triggering your headaches.  SEEK IMMEDIATE MEDICAL CARE IF:   You have confusion, personality changes or seizures.   You have headaches that wake you from sleep.   You have an increased frequency in your headaches.   You have a stiff neck.   You have a loss of vision.   You have muscle weakness.   You start losing your balance or have trouble walking.   You feel faint or pass out.  MAKE SURE YOU:   Understand these instructions.   Will watch your condition.   Will get help right away if you are not doing well or get worse.  Document Released: 09/06/2005 Document Revised: 08/26/2011 Document Reviewed: 04/22/2009 [facility] Patient Information 2012 [place], [place].

## 2012-01-20 NOTE — MAU Note (Signed)
Recently started on insulin.  Has been following diet from nutritionist.  Was told she would be admitted to get sugar regulated.

## 2012-03-08 ENCOUNTER — Other Ambulatory Visit (HOSPITAL_COMMUNITY): Payer: Self-pay | Admitting: Obstetrics & Gynecology

## 2012-03-08 DIAGNOSIS — Z3689 Encounter for other specified antenatal screening: Secondary | ICD-10-CM

## 2012-03-08 DIAGNOSIS — O09529 Supervision of elderly multigravida, unspecified trimester: Secondary | ICD-10-CM

## 2012-03-08 DIAGNOSIS — I10 Essential (primary) hypertension: Secondary | ICD-10-CM

## 2012-03-08 DIAGNOSIS — E119 Type 2 diabetes mellitus without complications: Secondary | ICD-10-CM

## 2012-03-09 ENCOUNTER — Ambulatory Visit (HOSPITAL_COMMUNITY): Payer: Medicaid Other | Attending: Obstetrics & Gynecology

## 2012-03-09 ENCOUNTER — Encounter: Payer: Self-pay | Admitting: Obstetrics & Gynecology

## 2012-03-09 LAB — US OP OB COMP + 14 WK

## 2012-03-20 ENCOUNTER — Other Ambulatory Visit (HOSPITAL_COMMUNITY): Payer: Self-pay | Admitting: Obstetrics & Gynecology

## 2012-03-20 ENCOUNTER — Ambulatory Visit (HOSPITAL_COMMUNITY)
Admission: RE | Admit: 2012-03-20 | Discharge: 2012-03-20 | Disposition: A | Discharge status: Home / Self Care | Payer: Medicaid Other | Source: Ambulatory Visit | Attending: Obstetrics & Gynecology | Admitting: Obstetrics & Gynecology

## 2012-03-20 VITALS — BP 129/78 | HR 102 | Wt 273.0 lb

## 2012-03-20 DIAGNOSIS — O09529 Supervision of elderly multigravida, unspecified trimester: Secondary | ICD-10-CM

## 2012-03-20 DIAGNOSIS — E669 Obesity, unspecified: Secondary | ICD-10-CM | POA: Insufficient documentation

## 2012-03-20 DIAGNOSIS — O169 Unspecified maternal hypertension, unspecified trimester: Secondary | ICD-10-CM

## 2012-03-20 DIAGNOSIS — O358XX0 Maternal care for other (suspected) fetal abnormality and damage, not applicable or unspecified: Secondary | ICD-10-CM | POA: Insufficient documentation

## 2012-03-20 DIAGNOSIS — O10019 Pre-existing essential hypertension complicating pregnancy, unspecified trimester: Secondary | ICD-10-CM | POA: Insufficient documentation

## 2012-03-20 DIAGNOSIS — O24919 Unspecified diabetes mellitus in pregnancy, unspecified trimester: Secondary | ICD-10-CM

## 2012-03-20 DIAGNOSIS — Z363 Encounter for antenatal screening for malformations: Secondary | ICD-10-CM | POA: Insufficient documentation

## 2012-03-20 DIAGNOSIS — Z1389 Encounter for screening for other disorder: Secondary | ICD-10-CM | POA: Insufficient documentation

## 2012-03-20 DIAGNOSIS — O99210 Obesity complicating pregnancy, unspecified trimester: Secondary | ICD-10-CM | POA: Insufficient documentation

## 2012-03-20 NOTE — Progress Notes (Signed)
Patient seen today  for follow up ultrasound.  See full report in [facility].  Mao Lockner, MD   

## 2012-04-17 ENCOUNTER — Ambulatory Visit (HOSPITAL_COMMUNITY)
Admission: RE | Admit: 2012-04-17 | Discharge: 2012-04-17 | Disposition: A | Discharge status: Home / Self Care | Payer: Medicaid Other | Source: Ambulatory Visit | Attending: Obstetrics & Gynecology | Admitting: Obstetrics & Gynecology

## 2012-04-17 VITALS — BP 128/82 | HR 96 | Wt 280.5 lb

## 2012-04-17 DIAGNOSIS — E669 Obesity, unspecified: Secondary | ICD-10-CM | POA: Insufficient documentation

## 2012-04-17 DIAGNOSIS — O10019 Pre-existing essential hypertension complicating pregnancy, unspecified trimester: Secondary | ICD-10-CM | POA: Insufficient documentation

## 2012-04-17 DIAGNOSIS — O09529 Supervision of elderly multigravida, unspecified trimester: Secondary | ICD-10-CM | POA: Insufficient documentation

## 2012-04-17 DIAGNOSIS — O169 Unspecified maternal hypertension, unspecified trimester: Secondary | ICD-10-CM

## 2012-04-17 DIAGNOSIS — O99210 Obesity complicating pregnancy, unspecified trimester: Secondary | ICD-10-CM | POA: Insufficient documentation

## 2012-04-17 DIAGNOSIS — O24919 Unspecified diabetes mellitus in pregnancy, unspecified trimester: Secondary | ICD-10-CM | POA: Insufficient documentation

## 2012-04-17 NOTE — Progress Notes (Signed)
Patient seen today  for follow up ultrasound.  See full report in [facility].  Alpha Gula, MD  Single IUP at 25 1/7 weeks Normal interval anatomy Normal amniotic fluid volume Fetal growth is appropriate (56th% tile)   Recommend follow up ultrasound in 4 weeks for interval growth.

## 2012-05-15 ENCOUNTER — Ambulatory Visit (HOSPITAL_COMMUNITY)
Admission: RE | Admit: 2012-05-15 | Discharge: 2012-05-15 | Disposition: A | Discharge status: Home / Self Care | Payer: Medicaid Other | Source: Ambulatory Visit | Attending: Obstetrics & Gynecology | Admitting: Obstetrics & Gynecology

## 2012-05-15 VITALS — BP 136/85 | HR 98 | Wt 290.0 lb

## 2012-05-15 DIAGNOSIS — O09529 Supervision of elderly multigravida, unspecified trimester: Secondary | ICD-10-CM | POA: Insufficient documentation

## 2012-05-15 DIAGNOSIS — O99210 Obesity complicating pregnancy, unspecified trimester: Secondary | ICD-10-CM | POA: Insufficient documentation

## 2012-05-15 DIAGNOSIS — E669 Obesity, unspecified: Secondary | ICD-10-CM | POA: Insufficient documentation

## 2012-05-15 DIAGNOSIS — O10019 Pre-existing essential hypertension complicating pregnancy, unspecified trimester: Secondary | ICD-10-CM | POA: Insufficient documentation

## 2012-05-15 DIAGNOSIS — O24919 Unspecified diabetes mellitus in pregnancy, unspecified trimester: Secondary | ICD-10-CM | POA: Insufficient documentation

## 2012-05-15 DIAGNOSIS — O169 Unspecified maternal hypertension, unspecified trimester: Secondary | ICD-10-CM

## 2012-05-15 NOTE — Progress Notes (Signed)
[name]  was seen today for an ultrasound appointment.  See full report in [facility].  Alpha Gula, MD  Single IUP at 29 1/7 weeks Normal interval anatomy Fetal growth is appropriate (55th %tile) Normal amniotic fluid volume  Recommend follow up growth scan in 4 weeks due to DM and CHTN.

## 2012-05-19 ENCOUNTER — Inpatient Hospital Stay (HOSPITAL_COMMUNITY)
Admission: AD | Admit: 2012-05-19 | Discharge: 2012-05-20 | DRG: 781 | Disposition: A | Discharge status: Home / Self Care | Payer: Medicaid Other | Source: Ambulatory Visit | Attending: Obstetrics and Gynecology | Admitting: Obstetrics and Gynecology

## 2012-05-19 ENCOUNTER — Encounter (HOSPITAL_COMMUNITY): Payer: Self-pay | Admitting: *Deleted

## 2012-05-19 DIAGNOSIS — E119 Type 2 diabetes mellitus without complications: Secondary | ICD-10-CM

## 2012-05-19 DIAGNOSIS — O24919 Unspecified diabetes mellitus in pregnancy, unspecified trimester: Principal | ICD-10-CM | POA: Diagnosis present

## 2012-05-19 DIAGNOSIS — N390 Urinary tract infection, site not specified: Secondary | ICD-10-CM | POA: Diagnosis present

## 2012-05-19 DIAGNOSIS — O212 Late vomiting of pregnancy: Secondary | ICD-10-CM | POA: Diagnosis present

## 2012-05-19 DIAGNOSIS — R51 Headache: Secondary | ICD-10-CM | POA: Diagnosis present

## 2012-05-19 DIAGNOSIS — IMO0001 Reserved for inherently not codable concepts without codable children: Secondary | ICD-10-CM | POA: Diagnosis present

## 2012-05-19 DIAGNOSIS — O2390 Unspecified genitourinary tract infection in pregnancy, unspecified trimester: Secondary | ICD-10-CM | POA: Diagnosis present

## 2012-05-19 LAB — TYPE AND SCREEN

## 2012-05-19 LAB — CBC
Hemoglobin: 11.3 g/dL — ABNORMAL LOW (ref 12.0–15.0)
MCH: 28.9 pg (ref 26.0–34.0)
MCHC: 33.2 g/dL (ref 30.0–36.0)
Platelets: 207 10*3/uL (ref 150–400)

## 2012-05-19 LAB — GLUCOSE, CAPILLARY: Glucose-Capillary: 98 mg/dL (ref 70–99)

## 2012-05-19 LAB — COMPREHENSIVE METABOLIC PANEL
ALT: 11 U/L (ref 0–35)
Calcium: 9.7 mg/dL (ref 8.4–10.5)
GFR calc Af Amer: >90 mL/min (ref >90)
Glucose, Bld: 102 mg/dL — ABNORMAL HIGH (ref 70–99)
Sodium: 134 mEq/L — ABNORMAL LOW (ref 135–145)
Total Protein: 6.1 g/dL (ref 6.0–8.3)

## 2012-05-19 MED ORDER — INSULIN REGULAR HUMAN 100 UNIT/ML IJ SOLN
10.0000 [IU] | Freq: Two times a day (BID) | INTRAMUSCULAR | Status: DC
Start: 2012-05-19 — End: 2012-05-19

## 2012-05-19 MED ORDER — LACTATED RINGERS IV SOLN
INTRAVENOUS | Status: DC
  Administered 2012-05-19: 125 mL/h via INTRAVENOUS
  Administered 2012-05-20 (×2): via INTRAVENOUS

## 2012-05-19 MED ORDER — INSULIN REGULAR HUMAN 100 UNIT/ML IJ SOLN
14.0000 [IU] | Freq: Every day | INTRAMUSCULAR | Status: DC
  Administered 2012-05-20: 14 [IU] via SUBCUTANEOUS
  Filled 2012-05-19 (×9): qty 0.14

## 2012-05-19 MED ORDER — PANTOPRAZOLE SODIUM 40 MG PO TBEC
80.0000 mg | DELAYED_RELEASE_TABLET | Freq: Every day | ORAL | Status: DC
  Filled 2012-05-19: qty 2

## 2012-05-19 MED ORDER — INSULIN NPH (HUMAN) (ISOPHANE) 100 UNIT/ML ~~LOC~~ SUSP
36.0000 [IU] | Freq: Every day | SUBCUTANEOUS | Status: DC
  Administered 2012-05-19: 36 [IU] via SUBCUTANEOUS

## 2012-05-19 MED ORDER — INSULIN REGULAR HUMAN 100 UNIT/ML IJ SOLN
10.0000 [IU] | Freq: Every day | INTRAMUSCULAR | Status: DC
  Administered 2012-05-19: 10 [IU] via SUBCUTANEOUS
  Filled 2012-05-19: qty 0.1

## 2012-05-19 MED ORDER — LORATADINE 10 MG PO TABS
10.0000 mg | ORAL_TABLET | Freq: Every day | ORAL | Status: DC
  Administered 2012-05-20: 10 mg via ORAL
  Filled 2012-05-19 (×2): qty 1

## 2012-05-19 MED ORDER — INSULIN NPH (HUMAN) (ISOPHANE) 100 UNIT/ML ~~LOC~~ SUSP: 24.0000 [IU] | Freq: Two times a day (BID) | SUBCUTANEOUS | Status: DC

## 2012-05-19 MED ORDER — BUPROPION HCL ER (SR) 150 MG PO TB12
150.0000 mg | ORAL_TABLET | Freq: Two times a day (BID) | ORAL | Status: DC
  Administered 2012-05-19 – 2012-05-20 (×2): 150 mg via ORAL
  Filled 2012-05-19 (×2): qty 1

## 2012-05-19 MED ORDER — FAMOTIDINE IN NACL 20-0.9 MG/50ML-% IV SOLN
20.0000 mg | Freq: Two times a day (BID) | INTRAVENOUS | Status: DC
  Administered 2012-05-19: 20 mg via INTRAVENOUS
  Filled 2012-05-19 (×2): qty 50

## 2012-05-19 MED ORDER — FOLIC ACID 1 MG PO TABS
1.0000 mg | ORAL_TABLET | Freq: Every day | ORAL | Status: DC
  Administered 2012-05-20: 1 mg via ORAL
  Filled 2012-05-19: qty 1

## 2012-05-19 MED ORDER — ONDANSETRON 8 MG/NS 50 ML IVPB
8.0000 mg | Freq: Three times a day (TID) | INTRAVENOUS | Status: DC | PRN
  Administered 2012-05-19: 8 mg via INTRAVENOUS
  Filled 2012-05-19: qty 8

## 2012-05-19 MED ORDER — FOLIC ACID 800 MCG PO TABS: 800.0000 ug | ORAL_TABLET | Freq: Two times a day (BID) | ORAL | Status: DC

## 2012-05-19 MED ORDER — INSULIN NPH (HUMAN) (ISOPHANE) 100 UNIT/ML ~~LOC~~ SUSP
24.0000 [IU] | Freq: Every day | SUBCUTANEOUS | Status: DC
  Administered 2012-05-20: 24 [IU] via SUBCUTANEOUS
  Filled 2012-05-19: qty 10

## 2012-05-19 MED ORDER — ACETAMINOPHEN 500 MG PO TABS
1000.0000 mg | ORAL_TABLET | Freq: Four times a day (QID) | ORAL | Status: DC | PRN
  Administered 2012-05-19 – 2012-05-20 (×2): 1000 mg via ORAL
  Filled 2012-05-19 (×2): qty 2

## 2012-05-19 NOTE — H&P (Signed)
36 y.o. [redacted]w[redacted]d  G3P1011 comes in c/o dizziness, nausea, inability to keep fluids down, HA and elevated CBGs.  Otherwise has good fetal movement and no bleeding.  No ctxes.  No s/s of any infections including UTI. URI or nuchal rigidity.  Past Medical History  Diagnosis Date  . Diabetes mellitus   . Hypertension   . Migraine headache   . Hyperlipidemia   . GERD (gastroesophageal reflux disease)   . Depression   . Asthma     Past Surgical History  Procedure Date  . Cholecystectomy   . Wisdom tooth extraction     OB History    Grav Para Term Preterm Abortions TAB SAB Ect Mult Living   3 1 1  0 1 0 1 0 0 1     # Outc Date GA Lbr Len/2nd Wgt Sex Del Anes PTL Lv   1 TRM            2 SAB            3 CUR               History   Social History  . Marital Status: Married    Spouse Name: N/A    Number of Children: N/A  . Years of Education: N/A   Occupational History  . Not on file.   Social History Main Topics  . Smoking status: Current Everyday Smoker -- 0.2 packs/day  . Smokeless tobacco: Never Used  . Alcohol Use: No  . Drug Use: No  . Sexually Active: No   Other Topics Concern  . Not on file   Social History Narrative  . No narrative on file   Betadine; Imitrex; Levofloxacin; Penicillins; Shellfish allergy; Sulfa antibiotics; Tequin; and Tramadol   Prenatal Course:   1.  Type II DM, dx early this pregnancy.  She has been on insulin since second trimester.  Her recent HgbA1C was 6.6 and her recent sugars have been running 150s to 300s despite insulin AM N24/R14 and PM N30/R10.  She feels the headache and dizziness are from elevated sugars but she can't figure out why they are suddenly so high.  2.  CHTN.  BPs have been stable.  Filed Vitals:   05/19/12 1654 05/19/12 1741  BP: 142/94   Pulse: 106   Temp: 97.7 F (36.5 C)   TempSrc: Oral   Resp: 18 18  Height: 5\' 9"  (1.753 m)   Weight: 131.543 kg (290 lb)     Lungs/Cor:  NAD Abdomen:  soft, gravid Ex:  no  cords, erythema SVE:  not done.  FHTs:  140, good STV, NST R Toco:  qocc   A/P   36 y.o. G3P1011 [redacted]w[redacted]d with uncontrolle Type II DM with N/V/headache. Admit for fluids, glucose control, nausea meds.  GBS not done.  Dameon Soltis A

## 2012-05-20 LAB — URINALYSIS, ROUTINE W REFLEX MICROSCOPIC
Glucose, UA: NEGATIVE mg/dL
Hgb urine dipstick: NEGATIVE
Ketones, ur: NEGATIVE mg/dL
Protein, ur: NEGATIVE mg/dL
Urobilinogen, UA: 0.2 mg/dL (ref 0.0–1.0)

## 2012-05-20 LAB — GLUCOSE, CAPILLARY
Glucose-Capillary: 87 mg/dL (ref 70–99)
Glucose-Capillary: 119 mg/dL — ABNORMAL HIGH (ref 70–99)

## 2012-05-20 MED ORDER — FAMOTIDINE 20 MG PO TABS
20.0000 mg | ORAL_TABLET | Freq: Two times a day (BID) | ORAL | Status: DC
  Administered 2012-05-20: 20 mg via ORAL
  Filled 2012-05-20: qty 1

## 2012-05-20 MED ORDER — ONDANSETRON HCL 8 MG PO TABS: 8.0000 mg | ORAL_TABLET | Freq: Three times a day (TID) | ORAL | Status: AC | PRN

## 2012-05-20 MED ORDER — NITROFURANTOIN MONOHYD MACRO 100 MG PO CAPS: 100.0000 mg | ORAL_CAPSULE | Freq: Two times a day (BID) | ORAL | Status: AC

## 2012-05-20 MED ORDER — OXYCODONE-ACETAMINOPHEN 5-325 MG PO TABS: 1.0000 | ORAL_TABLET | ORAL | Status: AC | PRN

## 2012-05-20 MED ORDER — BUTORPHANOL TARTRATE 1 MG/ML IJ SOLN
1.0000 mg | INTRAMUSCULAR | Status: DC | PRN
  Administered 2012-05-20: 1 mg via INTRAVENOUS
  Filled 2012-05-20: qty 1

## 2012-05-20 NOTE — Discharge Summary (Signed)
Obstetric Discharge Summary Reason for Admission: n/v, ha, Type 2 dm control at 30 weeks Prenatal Procedures: none Intrapartum Procedures: none Postpartum Procedures: undelivered Complications-Operative and Postpartum: none Hemoglobin  Date Value Range Status  05/19/2012 11.3* 12.0 - 15.0 g/dL Final     HCT  Date Value Range Status  05/19/2012 34.0* 36.0 - 46.0 % Final   Hospital course: admitted for IVF secondary N/V and HA control.  Sugars in hospital were perfect and no changes were made to insulin regimen.  Found to have UTI- will rx at home with macrobid.  Discharge Diagnoses: N/V, HA, dehydration, Type 2 DM  Discharge Information: Date: 05/20/2012 Activity: pelvic rest Diet: routine and Diabetic Medications: Percocet and Zofran, and macrobid. Condition: stable Instructions: refer to practice specific booklet and preterm precautions.  DM diet Discharge to: home Follow-up Information    Follow up with Kebin Maye A, MD. Schedule an appointment as soon as possible for a visit in 1 week.   Contact information:   [address] [place] [address] [phone]           Florence Antonelli A 05/20/2012, 11:06 AM

## 2012-05-20 NOTE — Progress Notes (Signed)
36 y.o. G3P1011 [redacted]w[redacted]d HD#1 admitted for 67 WEEKS UNCONTROLLED DIABETES NAUSEA VOMITTING.  Pt currently stable with no c/o except for HA relieved with stadol.  Good FM and no ctxes or bleeding.  Filed Vitals:   05/19/12 1741 05/19/12 1858 05/19/12 2007 05/20/12 0751  BP:   146/81 138/75  Pulse:   100 93  Temp:   98.2 F (36.8 C) 97.6 F (36.4 C)  TempSrc:   Oral Oral  Resp: 18 18 20 20   Height:      Weight:        Lungs CTA Cor RRR Abd  Soft, gravid, nontender Ex SCDs FHTs  120ss, good short term variability, NST R Toco  none  Results for orders placed during the hospital encounter of 05/19/12 (from the past 24 hour(s))  GLUCOSE, CAPILLARY     Status: Normal   Collection Time   05/19/12  5:51 PM      Component Value Range   Glucose-Capillary 98  70 - 99 mg/dL   Comment 1 Documented in Chart     Comment 2 Notify RN    CBC     Status: Abnormal   Collection Time   05/19/12  5:55 PM      Component Value Range   WBC 13.3 (*) 4.0 - 10.5 K/uL   RBC 3.91  3.87 - 5.11 MIL/uL   Hemoglobin 11.3 (*) 12.0 - 15.0 g/dL   HCT 16.1 (*) 09.6 - 04.5 %   MCV 87.0  78.0 - 100.0 fL   MCH 28.9  26.0 - 34.0 pg   MCHC 33.2  30.0 - 36.0 g/dL   RDW 40.9  81.1 - 91.4 %   Platelets 207  150 - 400 K/uL  COMPREHENSIVE METABOLIC PANEL     Status: Abnormal   Collection Time   05/19/12  5:55 PM      Component Value Range   Sodium 134 (*) 135 - 145 mEq/L   Potassium 4.0  3.5 - 5.1 mEq/L   Chloride 102  96 - 112 mEq/L   CO2 21  19 - 32 mEq/L   Glucose, Bld 102 (*) 70 - 99 mg/dL   BUN 12  6 - 23 mg/dL   Creatinine, Ser 7.82  0.50 - 1.10 mg/dL   Calcium 9.7  8.4 - 95.6 mg/dL   Total Protein 6.1  6.0 - 8.3 g/dL   Albumin 2.7 (*) 3.5 - 5.2 g/dL   AST 10  0 - 37 U/L   ALT 11  0 - 35 U/L   Alkaline Phosphatase 84  39 - 117 U/L   Total Bilirubin 0.2 (*) 0.3 - 1.2 mg/dL   GFR calc non Af Amer >90  >90 mL/min   GFR calc Af Amer >90  >90 mL/min  TYPE AND SCREEN     Status: Normal   Collection Time   05/19/12  6:30 PM      Component Value Range   ABO/RH(D) O POS     Antibody Screen NEG     Sample Expiration 05/22/2012    GLUCOSE, CAPILLARY     Status: Normal   Collection Time   05/19/12 11:55 PM      Component Value Range   Glucose-Capillary 95  70 - 99 mg/dL  URINALYSIS, ROUTINE W REFLEX MICROSCOPIC     Status: Abnormal   Collection Time   05/20/12 12:10 AM      Component Value Range   Color, Urine YELLOW  YELLOW   APPearance  CLEAR  CLEAR   Specific Gravity, Urine <1.005 (*) 1.005 - 1.030   pH 6.0  5.0 - 8.0   Glucose, UA NEGATIVE  NEGATIVE mg/dL   Hgb urine dipstick NEGATIVE  NEGATIVE   Bilirubin Urine NEGATIVE  NEGATIVE   Ketones, ur NEGATIVE  NEGATIVE mg/dL   Protein, ur NEGATIVE  NEGATIVE mg/dL   Urobilinogen, UA 0.2  0.0 - 1.0 mg/dL   Nitrite NEGATIVE  NEGATIVE   Leukocytes, UA LARGE (*) NEGATIVE  URINE MICROSCOPIC-ADD ON     Status: Abnormal   Collection Time   05/20/12 12:10 AM      Component Value Range   Squamous Epithelial / LPF MANY (*) RARE   WBC, UA 11-20  <3 WBC/hpf   Bacteria, UA RARE  RARE   Urine-Other MUCOUS PRESENT    GLUCOSE, CAPILLARY     Status: Normal   Collection Time   05/20/12  6:39 AM      Component Value Range   Glucose-Capillary 87  70 - 99 mg/dL  GLUCOSE, CAPILLARY     Status: Abnormal   Collection Time   05/20/12 10:39 AM      Component Value Range   Glucose-Capillary 119 (*) 70 - 99 mg/dL    A:  HD#1  [redacted]w[redacted]d with Type II DM.  Sugars here are much better after fluids and nausea meds.  HA still an issue but BPs are normal.  UTI.  P: 1.  Continue current insulin regimen.  Encouraged diet. 2.  Macrobid for UTI. 3.  Zofran for N/V. 4.  Percocet for HA.  Edson Deridder A

## 2012-05-20 NOTE — Progress Notes (Signed)
The patient is receiving Pepcid by the intravenous route.  Based on criteria approved by the Pharmacy and Therapeutics Committee and the Medical Executive Committee, the medication is being converted to the equivalent oral dose form.  These criteria include: -No Active GI bleeding -Able to tolerate diet of full liquids (or better) or tube feeding -Able to tolerate other medications by the oral or enteral route  If you have any questions about this conversion, please contact the Pharmacy Department (ext [phone]).  Thank you.

## 2012-05-21 LAB — URINE CULTURE: Colony Count: >=100000

## 2012-06-12 ENCOUNTER — Ambulatory Visit (HOSPITAL_COMMUNITY)
Admission: RE | Admit: 2012-06-12 | Discharge: 2012-06-12 | Disposition: A | Discharge status: Home / Self Care | Payer: Medicaid Other | Source: Ambulatory Visit | Attending: Obstetrics & Gynecology | Admitting: Obstetrics & Gynecology

## 2012-06-12 DIAGNOSIS — E669 Obesity, unspecified: Secondary | ICD-10-CM | POA: Insufficient documentation

## 2012-06-12 DIAGNOSIS — O169 Unspecified maternal hypertension, unspecified trimester: Secondary | ICD-10-CM

## 2012-06-12 DIAGNOSIS — O99210 Obesity complicating pregnancy, unspecified trimester: Secondary | ICD-10-CM | POA: Insufficient documentation

## 2012-06-12 DIAGNOSIS — O09529 Supervision of elderly multigravida, unspecified trimester: Secondary | ICD-10-CM | POA: Insufficient documentation

## 2012-06-12 DIAGNOSIS — O10019 Pre-existing essential hypertension complicating pregnancy, unspecified trimester: Secondary | ICD-10-CM | POA: Insufficient documentation

## 2012-06-12 DIAGNOSIS — O24919 Unspecified diabetes mellitus in pregnancy, unspecified trimester: Secondary | ICD-10-CM | POA: Insufficient documentation

## 2012-06-12 NOTE — Progress Notes (Signed)
Ms. Rochette had an ultrasound appointment today.  Please see [facility]-OB/GYN report for details.  Comments There is an active singleton fetus with no apparent dysmorphic features on today's routine anatomic re-examination.  The biometry suggests a fetus with accelerated interval growth with an EFW at >90th percentile and AC >97th percentile for gestational age.    Impression Active singleton fetus. Accelerated interval growth. Polyhydramnios  Collectively, the findings of accelerated interval growth and polyhydramnios suggest poorly controlled maternal diabetes mellitus.   Recommendations 1. Repeat interval growth assessment by ultrasound was scheduled for your patient in 4 weeks. 2. Your patient indicated to me that she is having twice weekly NST's and weekly AFI's in your office.  If you would like these done by our unit, we would be happy to schedule them here with [place]. 3. Follow as clinically indicated.  Rogelia Boga, MD, MS, FACOG Assistant Professor Section of Maternal-Fetal Medicine [facility]

## 2012-06-20 ENCOUNTER — Encounter (HOSPITAL_COMMUNITY): Payer: Self-pay | Admitting: *Deleted

## 2012-06-20 ENCOUNTER — Inpatient Hospital Stay (HOSPITAL_COMMUNITY): Payer: Medicaid Other

## 2012-06-20 ENCOUNTER — Inpatient Hospital Stay (HOSPITAL_COMMUNITY)
Admission: AD | Admit: 2012-06-20 | Discharge: 2012-06-20 | Disposition: A | Discharge status: Home / Self Care | Payer: Medicaid Other | Source: Ambulatory Visit | Attending: Obstetrics and Gynecology | Admitting: Obstetrics and Gynecology

## 2012-06-20 DIAGNOSIS — E119 Type 2 diabetes mellitus without complications: Secondary | ICD-10-CM | POA: Insufficient documentation

## 2012-06-20 DIAGNOSIS — O99891 Other specified diseases and conditions complicating pregnancy: Secondary | ICD-10-CM | POA: Insufficient documentation

## 2012-06-20 DIAGNOSIS — O4700 False labor before 37 completed weeks of gestation, unspecified trimester: Secondary | ICD-10-CM

## 2012-06-20 DIAGNOSIS — O24919 Unspecified diabetes mellitus in pregnancy, unspecified trimester: Secondary | ICD-10-CM | POA: Insufficient documentation

## 2012-06-20 DIAGNOSIS — R030 Elevated blood-pressure reading, without diagnosis of hypertension: Secondary | ICD-10-CM | POA: Insufficient documentation

## 2012-06-20 LAB — POCT FERN TEST: Fern Test: NEGATIVE

## 2012-06-20 NOTE — Progress Notes (Signed)
Lilyan Punt Np in earlier to discuss u/s results and d/c plan. Written and verbal d/c instructions given and understanding voiced

## 2012-06-20 NOTE — Progress Notes (Signed)
NP in and spec exam done to r/o SROM. Fern slide obtained.

## 2012-06-20 NOTE — MAU Note (Signed)
I've been leaking fld about 1.5hrs. Having some pelvic pressure and "feeling like I need to have a BM"

## 2012-06-20 NOTE — MAU Provider Note (Signed)
History     CSN: [id number]  Arrival date and time: 06/20/12 8119   First Provider Initiated Contact with Patient 06/20/12 2000      Chief Complaint  Patient presents with  . Rupture of Membranes   HPI [name] is 36 y.o. G3P1011 [redacted]w[redacted]d weeks presenting with ? Rupture of membranes.  She had a gush fluid at 17:15.  Denies vaginal bleeding, contractions.  + fetal movement.  Hasn't felt well today.  Increased lower abdominal pressure that makes it hurt to walk.  Insulin dependent diabetic with this pregnancy.      Past Medical History  Diagnosis Date  . Diabetes mellitus   . Hypertension   . Migraine headache   . Hyperlipidemia   . GERD (gastroesophageal reflux disease)   . Depression   . Asthma     Past Surgical History  Procedure Date  . Cholecystectomy   . Wisdom tooth extraction     Family History  Problem Relation Age of Onset  . Heart disease Mother   . COPD Mother   . Hypertension Mother   . Hyperlipidemia Mother   . Hearing loss Maternal Grandmother     History  Substance Use Topics  . Smoking status: Current Every Day Smoker -- 0.2 packs/day  . Smokeless tobacco: Never Used  . Alcohol Use: No    Allergies:  Allergies  Allergen Reactions  . Betadine (Povidone Iodine) Anaphylaxis  . Imitrex (Sumatriptan Base) Anaphylaxis  . Levofloxacin Anaphylaxis  . Penicillins Anaphylaxis  . Shellfish Allergy Anaphylaxis  . Sulfa Antibiotics Anaphylaxis  . Tequin Anaphylaxis    Prescriptions prior to admission  Medication Sig Dispense Refill  . acetaminophen (TYLENOL) 500 MG tablet Take 1,000 mg by mouth every 6 (six) hours as needed. For pain      . buPROPion (WELLBUTRIN SR) 150 MG 12 hr tablet Take 150 mg by mouth 2 (two) times daily.      . cetirizine (ZYRTEC) 10 MG tablet Take 10 mg by mouth daily as needed. For allergies      . folic acid (FOLVITE) 800 MCG tablet Take 800 mcg by mouth 2 (two) times daily.      . insulin NPH (HUMULIN N,NOVOLIN N)  100 UNIT/ML injection Inject 24-36 Units into the skin 2 (two) times daily. 24 units before breakfast and 47 units before bed      . insulin regular (NOVOLIN R,HUMULIN R) 100 units/mL injection Inject 10-14 Units into the skin 2 (two) times daily. 14 units before breakfast and 22 units before dinner      . omeprazole (PRILOSEC) 40 MG capsule Take 40 mg by mouth every morning.        Review of Systems  Constitutional: Positive for malaise/fatigue.  Gastrointestinal: Positive for nausea.  Genitourinary:       ? Rupture of membranes  Neg for contractions/vaginal bleeding   Physical Exam   Blood pressure 146/89, pulse 111, temperature 98.6 F (37 C), temperature source Oral, resp. rate 20, height 5\' 9"  (1.753 m), weight 131.725 kg (290 lb 6.4 oz), last menstrual period 10/24/2011.  Physical Exam  Constitutional: She is oriented to person, place, and time. She appears well-developed and well-nourished.  HENT:  Head: Normocephalic.  Neck: Normal range of motion.  Respiratory: Effort normal.  Genitourinary:       Neg for vaginal pooling for fluid.  There is moderate amount of thin white discharge without odor.  Neg for vaginal bleeding.  Cervical exam by Alvino Chapel, RN--1 cm,  high and thick.    Neurological: She is alert and oriented to person, place, and time.  Skin: Skin is warm and dry.   Blood Pressure  138/92, 149/87  Results for orders placed during the hospital encounter of 06/20/12 (from the past 24 hour(s))  POCT FERN TEST     Status: Normal   Collection Time   06/20/12  8:30 PM      Component Value Range   Fern Test Negative     MAU Course  Procedures   FMS  Baseline 130s, moderate variability, reactive.  Mild uterine irritability with occ contraction  MDM 20:20  Reported MSE,  Blood pressure readings, cervical exam and Negative fern to Dr. Henderson Cloud 20:45 care turned over to Lilyan Punt, NP  Clinical Data: Diabetes. Hypertension.  LIMITED OBSTETRIC ULTRASOUND AND BPP  Number of  Fetuses: 1  Heart Rate: 139 bpm  Movement: Visualized  Presentation: Cephalic  Placental Location: Posterior and fundal  Previa: No  Amniotic Fluid (Subjective): Upper normal to increased  Vertical pocket: AFI: 25.6 cm (5%ile 8.1 cm, 95%ile 24.8 cm)  MATERNAL FINDINGS:  Uterus/Adnexae: No evidence for adnexal mass.  BPP: 8/8 after 4 that is of observation.  IMPRESSION:  Single living intrauterine gestation with upper normal to mildly  increased fluid volume.  Normal BPP with score of 8/8 after 4 minutes of observation.  Recommend followup with non-emergent complete OB 14+ wk US  examination for fetal biometric evaluation and anatomic survey if  not already performed.   Assessment and Plan  A:  [redacted]w[redacted]d gestation      Known insulin dependent diabetes      Elevated blood pressure      BPP 8/8      AFI  25  P: Will need to review AFI results in office and make plan for follow up if needed. No ROM found today. Not in labor Will send home and advised to keep appointments in the office. Call your doctor if you have questions or concerns.  KEY,EVE M 06/20/2012, 8:10 PM

## 2012-06-29 ENCOUNTER — Encounter (HOSPITAL_COMMUNITY): Payer: Self-pay | Admitting: *Deleted

## 2012-06-29 ENCOUNTER — Inpatient Hospital Stay (HOSPITAL_COMMUNITY)
Admission: AD | Admit: 2012-06-29 | Discharge: 2012-06-29 | Disposition: A | Discharge status: Home / Self Care | Payer: Medicaid Other | Source: Ambulatory Visit | Attending: Obstetrics and Gynecology | Admitting: Obstetrics and Gynecology

## 2012-06-29 DIAGNOSIS — R51 Headache: Secondary | ICD-10-CM | POA: Insufficient documentation

## 2012-06-29 DIAGNOSIS — O139 Gestational [pregnancy-induced] hypertension without significant proteinuria, unspecified trimester: Secondary | ICD-10-CM | POA: Insufficient documentation

## 2012-06-29 DIAGNOSIS — E119 Type 2 diabetes mellitus without complications: Secondary | ICD-10-CM | POA: Insufficient documentation

## 2012-06-29 DIAGNOSIS — O24919 Unspecified diabetes mellitus in pregnancy, unspecified trimester: Secondary | ICD-10-CM | POA: Insufficient documentation

## 2012-06-29 LAB — COMPREHENSIVE METABOLIC PANEL
ALT: 15 U/L (ref 0–35)
AST: 13 U/L (ref 0–37)
Calcium: 9.3 mg/dL (ref 8.4–10.5)
GFR calc Af Amer: >90 mL/min (ref >90)
Glucose, Bld: 124 mg/dL — ABNORMAL HIGH (ref 70–99)
Sodium: 131 mEq/L — ABNORMAL LOW (ref 135–145)
Total Protein: 6.5 g/dL (ref 6.0–8.3)

## 2012-06-29 LAB — URINE MICROSCOPIC-ADD ON

## 2012-06-29 LAB — URINALYSIS, ROUTINE W REFLEX MICROSCOPIC
Bilirubin Urine: NEGATIVE
Ketones, ur: NEGATIVE mg/dL
Nitrite: NEGATIVE
Urobilinogen, UA: 0.2 mg/dL (ref 0.0–1.0)

## 2012-06-29 LAB — CBC
MCH: 27.6 pg (ref 26.0–34.0)
MCHC: 32.4 g/dL (ref 30.0–36.0)
Platelets: 237 10*3/uL (ref 150–400)

## 2012-06-29 LAB — URIC ACID: Uric Acid, Serum: 5.7 mg/dL (ref 2.4–7.0)

## 2012-06-29 MED ORDER — OXYCODONE-ACETAMINOPHEN 5-325 MG PO TABS
1.0000 | ORAL_TABLET | ORAL | Status: DC | PRN
  Administered 2012-06-29: 1 via ORAL
  Filled 2012-06-29: qty 1

## 2012-06-29 NOTE — MAU Note (Signed)
Pt was sent over from Dr Charlott Rakes office for monitoring and [facility] evaluation

## 2012-06-29 NOTE — MAU Note (Signed)
Patient sent by Dr. Tenny Craw for r/o preeclampsia, c/o headache and vision changes, patient has chronic hypertension, insulin dependent diabetes.

## 2012-06-29 NOTE — MAU Provider Note (Signed)
History     CSN: [id number]  Arrival date and time: 06/29/12 1614   First Provider Initiated Contact with Patient 06/29/12 1643      Chief Complaint  Patient presents with  . Headache   HPI This is a 36 y.o. female at [redacted]w[redacted]d who presents from office for evaluation of hypertension in pregnancy.  Has had headache all day. Denies visual changes. Not much swelling. No abdominal pain.  OB History    Grav Para Term Preterm Abortions TAB SAB Ect Mult Living   3 1 1  0 1 0 1 0 0 1      Past Medical History  Diagnosis Date  . Diabetes mellitus   . Hypertension   . Migraine headache   . Hyperlipidemia   . GERD (gastroesophageal reflux disease)   . Depression   . Asthma     Past Surgical History  Procedure Date  . Cholecystectomy   . Wisdom tooth extraction     Family History  Problem Relation Age of Onset  . Heart disease Mother   . COPD Mother   . Hypertension Mother   . Hyperlipidemia Mother   . Hearing loss Maternal Grandmother     History  Substance Use Topics  . Smoking status: Current Every Day Smoker -- 0.2 packs/day  . Smokeless tobacco: Never Used  . Alcohol Use: No    Allergies:  Allergies  Allergen Reactions  . Betadine (Povidone Iodine) Anaphylaxis  . Imitrex (Sumatriptan Base) Anaphylaxis  . Levofloxacin Anaphylaxis  . Penicillins Anaphylaxis  . Shellfish Allergy Anaphylaxis  . Sulfa Antibiotics Anaphylaxis  . Tequin Anaphylaxis    Prescriptions prior to admission  Medication Sig Dispense Refill  . acetaminophen (TYLENOL) 500 MG tablet Take 1,000 mg by mouth every 6 (six) hours as needed. For pain      . buPROPion (WELLBUTRIN SR) 150 MG 12 hr tablet Take 150 mg by mouth 2 (two) times daily.      . cetirizine (ZYRTEC) 10 MG tablet Take 10 mg by mouth daily as needed. For allergies      . folic acid (FOLVITE) 800 MCG tablet Take 800 mcg by mouth 2 (two) times daily.      . insulin NPH (HUMULIN N,NOVOLIN N) 100 UNIT/ML injection Inject 24-36  Units into the skin 2 (two) times daily. 24 units before breakfast and 47 units before bed      . insulin regular (NOVOLIN R,HUMULIN R) 100 units/mL injection Inject 10-14 Units into the skin 2 (two) times daily. 14 units before breakfast and 22 units before dinner      . omeprazole (PRILOSEC) 40 MG capsule Take 40 mg by mouth every morning.        ROS See HPI  Physical Exam   Blood pressure 162/93, pulse 101, temperature 97.7 F (36.5 C), temperature source Oral, resp. rate 18, height 5\' 9"  (1.753 m), weight 290 lb (131.543 kg), last menstrual period 10/24/2011. Filed Vitals:   06/29/12 1623 06/29/12 1625 06/29/12 1647  BP: 149/95 149/95 162/93  Pulse: 104 104 101  Temp: 97.7 F (36.5 C)    TempSrc: Oral    Resp: 18    Height: 5\' 9"  (1.753 m)    Weight: 290 lb (131.543 kg)      Physical Exam  Constitutional: She is oriented to person, place, and time. She appears well-developed and well-nourished. No distress.  Cardiovascular: Normal rate.   Respiratory: Effort normal.  GI: Soft. She exhibits no distension and no mass. There  is no tenderness. There is no rebound and no guarding.       Gravid in contour FHR reassuring   Musculoskeletal: Normal range of motion. She exhibits edema (Trace).  Neurological: She is alert and oriented to person, place, and time. She displays normal reflexes. She exhibits normal muscle tone.  Skin: Skin is warm and dry.  Psychiatric: She has a normal mood and affect.    MAU Course  Procedures  MDM Will give a Percocet for headache while waiting for labs.   Assessment and Plan  A:  SIUP at [redacted]w[redacted]d       Gestational hypertension vs preeclampsia      Diabetes  P:  Await lab results  [facility] 06/29/2012, 4:49 PM  Results for orders placed during the hospital encounter of 06/29/12 (from the past 24 hour(s))  URINALYSIS, ROUTINE W REFLEX MICROSCOPIC     Status: Abnormal   Collection Time   06/29/12  4:20 PM      Component Value Range    Color, Urine YELLOW  YELLOW   APPearance CLEAR  CLEAR   Specific Gravity, Urine 1.020  1.005 - 1.030   pH 6.0  5.0 - 8.0   Glucose, UA NEGATIVE  NEGATIVE mg/dL   Hgb urine dipstick NEGATIVE  NEGATIVE   Bilirubin Urine NEGATIVE  NEGATIVE   Ketones, ur NEGATIVE  NEGATIVE mg/dL   Protein, ur NEGATIVE  NEGATIVE mg/dL   Urobilinogen, UA 0.2  0.0 - 1.0 mg/dL   Nitrite NEGATIVE  NEGATIVE   Leukocytes, UA SMALL (*) NEGATIVE  URINE MICROSCOPIC-ADD ON     Status: Abnormal   Collection Time   06/29/12  4:20 PM      Component Value Range   Squamous Epithelial / LPF MANY (*) RARE   WBC, UA 21-50  <3 WBC/hpf   RBC / HPF 7-10  <3 RBC/hpf   Bacteria, UA MANY (*) RARE   Urine-Other MUCOUS PRESENT    CBC     Status: Abnormal   Collection Time   06/29/12  4:28 PM      Component Value Range   WBC 11.1 (*) 4.0 - 10.5 K/uL   RBC 4.10  3.87 - 5.11 MIL/uL   Hemoglobin 11.3 (*) 12.0 - 15.0 g/dL   HCT 16.1 (*) 09.6 - 04.5 %   MCV 85.1  78.0 - 100.0 fL   MCH 27.6  26.0 - 34.0 pg   MCHC 32.4  30.0 - 36.0 g/dL   RDW 40.9  81.1 - 91.4 %   Platelets 237  150 - 400 K/uL  COMPREHENSIVE METABOLIC PANEL     Status: Abnormal   Collection Time   06/29/12  4:28 PM      Component Value Range   Sodium 131 (*) 135 - 145 mEq/L   Potassium 4.0  3.5 - 5.1 mEq/L   Chloride 98  96 - 112 mEq/L   CO2 20  19 - 32 mEq/L   Glucose, Bld 124 (*) 70 - 99 mg/dL   BUN 10  6 - 23 mg/dL   Creatinine, Ser 7.82  0.50 - 1.10 mg/dL   Calcium 9.3  8.4 - 95.6 mg/dL   Total Protein 6.5  6.0 - 8.3 g/dL   Albumin 2.6 (*) 3.5 - 5.2 g/dL   AST 13  0 - 37 U/L   ALT 15  0 - 35 U/L   Alkaline Phosphatase 151 (*) 39 - 117 U/L   Total Bilirubin 0.3  0.3 - 1.2 mg/dL  GFR calc non Af Amer >90  >90 mL/min   GFR calc Af Amer >90  >90 mL/min  LACTATE DEHYDROGENASE     Status: Normal   Collection Time   06/29/12  4:28 PM      Component Value Range   LDH 133  94 - 250 U/L  URIC ACID     Status: Normal   Collection Time   06/29/12   4:28 PM      Component Value Range   Uric Acid, Serum 5.7  2.4 - 7.0 mg/dL   Discussed with Dr Tenny Craw Discharge home 24 hr urine supplies to be turned in Monday at their office Preeclampsia precautions

## 2012-07-03 NOTE — Progress Notes (Signed)
Post discharge review completed. 

## 2012-07-04 ENCOUNTER — Institutional Professional Consult (permissible substitution): Payer: Medicaid Other | Admitting: Cardiology

## 2012-07-07 ENCOUNTER — Other Ambulatory Visit (HOSPITAL_COMMUNITY): Payer: Self-pay | Admitting: Obstetrics & Gynecology

## 2012-07-07 DIAGNOSIS — O24919 Unspecified diabetes mellitus in pregnancy, unspecified trimester: Secondary | ICD-10-CM

## 2012-07-07 DIAGNOSIS — O09529 Supervision of elderly multigravida, unspecified trimester: Secondary | ICD-10-CM

## 2012-07-07 DIAGNOSIS — O9989 Other specified diseases and conditions complicating pregnancy, childbirth and the puerperium: Secondary | ICD-10-CM

## 2012-07-07 DIAGNOSIS — O10019 Pre-existing essential hypertension complicating pregnancy, unspecified trimester: Secondary | ICD-10-CM

## 2012-07-10 ENCOUNTER — Ambulatory Visit (HOSPITAL_COMMUNITY)
Admission: RE | Admit: 2012-07-10 | Discharge: 2012-07-10 | Disposition: A | Discharge status: Home / Self Care | Payer: Medicaid Other | Source: Ambulatory Visit | Attending: Obstetrics & Gynecology | Admitting: Obstetrics & Gynecology

## 2012-07-10 ENCOUNTER — Encounter (HOSPITAL_COMMUNITY): Payer: Self-pay

## 2012-07-10 ENCOUNTER — Inpatient Hospital Stay (HOSPITAL_COMMUNITY)
Admission: AD | Admit: 2012-07-10 | Discharge: 2012-07-10 | Disposition: A | Discharge status: Home / Self Care | Payer: Medicaid Other | Source: Ambulatory Visit | Attending: Obstetrics and Gynecology | Admitting: Obstetrics and Gynecology

## 2012-07-10 DIAGNOSIS — O9989 Other specified diseases and conditions complicating pregnancy, childbirth and the puerperium: Secondary | ICD-10-CM

## 2012-07-10 DIAGNOSIS — E669 Obesity, unspecified: Secondary | ICD-10-CM | POA: Insufficient documentation

## 2012-07-10 DIAGNOSIS — R030 Elevated blood-pressure reading, without diagnosis of hypertension: Secondary | ICD-10-CM | POA: Insufficient documentation

## 2012-07-10 DIAGNOSIS — O09529 Supervision of elderly multigravida, unspecified trimester: Secondary | ICD-10-CM | POA: Insufficient documentation

## 2012-07-10 DIAGNOSIS — O99891 Other specified diseases and conditions complicating pregnancy: Secondary | ICD-10-CM | POA: Insufficient documentation

## 2012-07-10 DIAGNOSIS — O24919 Unspecified diabetes mellitus in pregnancy, unspecified trimester: Secondary | ICD-10-CM | POA: Insufficient documentation

## 2012-07-10 DIAGNOSIS — O10019 Pre-existing essential hypertension complicating pregnancy, unspecified trimester: Secondary | ICD-10-CM | POA: Insufficient documentation

## 2012-07-10 LAB — COMPREHENSIVE METABOLIC PANEL
Albumin: 2.3 g/dL — ABNORMAL LOW (ref 3.5–5.2)
Alkaline Phosphatase: 151 U/L — ABNORMAL HIGH (ref 39–117)
BUN: 11 mg/dL (ref 6–23)
Calcium: 8.8 mg/dL (ref 8.4–10.5)
Creatinine, Ser: 0.7 mg/dL (ref 0.50–1.10)
GFR calc Af Amer: >90 mL/min (ref >90)
Potassium: 4.1 mEq/L (ref 3.5–5.1)
Total Protein: 5.7 g/dL — ABNORMAL LOW (ref 6.0–8.3)

## 2012-07-10 LAB — URINALYSIS, ROUTINE W REFLEX MICROSCOPIC
Nitrite: NEGATIVE
Protein, ur: NEGATIVE mg/dL
Specific Gravity, Urine: 1.025 (ref 1.005–1.030)
Urobilinogen, UA: 0.2 mg/dL (ref 0.0–1.0)

## 2012-07-10 LAB — CBC
HCT: 32.8 % — ABNORMAL LOW (ref 36.0–46.0)
MCH: 27.4 pg (ref 26.0–34.0)
MCHC: 32.3 g/dL (ref 30.0–36.0)
RDW: 13.7 % (ref 11.5–15.5)

## 2012-07-10 LAB — URIC ACID: Uric Acid, Serum: 5.1 mg/dL (ref 2.4–7.0)

## 2012-07-10 LAB — LACTATE DEHYDROGENASE: LDH: 142 U/L (ref 94–250)

## 2012-07-10 NOTE — MAU Provider Note (Signed)
36 y.o. G3P1011 at [redacted]w[redacted]d sent from MFM d/t elevated BP for preeclampsia rule out. Denies headache, vision changes, abd pain.   Filed Vitals:   07/10/12 1130 07/10/12 1145 07/10/12 1200 07/10/12 1247  BP: 140/85 138/81 145/82 142/82  Pulse: 104 99 92 88  Temp:      TempSrc:      Resp:    16  Height:      Weight:       Gen: no distress Abd: nontender Neuro: no clonus, DTRs 1+ bilaterally  EFM reactive  PIH labs ordered. RN will call Dr. Dareen Piano with lab results.

## 2012-07-10 NOTE — Progress Notes (Signed)
[name]  was seen today for an ultrasound appointment.  See full report in AS-OB/GYN.  Alpha Gula, MD  Comments [name] returns for follow up ultrasound for growth due to type 2 diabetes on insulin.  BPs on arrival to clinic - 144/94, 153/105.   Impression Single IUP at 37 1/7 weeks Type 2 diabetes on insulin Fetal Macrosomia  with estimated fetal weight of 4795 g (>90th %tile) Polyhydramnios with AFI of 32 cm  Recommendation Ultrasound findings were discussed with Dr. Dareen Piano.  The patient was sent to MAU for preeclampsia evaluation. Recommend delivery (primary C-section due to suspected macrosomia) if the patient rules in for preeclampsia. If preeclampsia is not detected, would consider amniocentesis at 38 weeks and C-section at that time if mature.

## 2012-07-10 NOTE — ED Provider Notes (Signed)
Pt was seen by MFM this am for an ultrasound for EFW.  The EFW was greater than 10lbs.  While there she was noted to have a elevated B/P.  Pt was sent to the ER for evaluation.   While in the ER the patient's B/Ps returned to a normal range. All of the [facility] labs were normal. Urine had no protein.  MFM recommended delivery if the patient had Preeclampsia or abnormal PIH labs.    PLAN/  Pt will be discharged with instructions for bedrest. She will continue to check her B/P at home.  She return to the office on Weds for an NST.  If still pregnant she will have an amnio on Mon for maturity and delivery if mature.  Rec daily FMCs.

## 2012-07-10 NOTE — MAU Note (Signed)
Dr. Dareen Piano notified pt's u/a wnl, labs wnl, pt denies PIH s/s. Dr. Dareen Piano to come to MAU to discharge pt.

## 2012-07-10 NOTE — MAU Note (Signed)
Pt states she got worked up after finding out her baby weighed 10+lbs. Had planned on probable c/s this pregnancy. Last baby was 8+lbs. Hx chronic htn and Type 2 DM

## 2012-07-10 NOTE — ED Notes (Signed)
Pt taken to MAU at this time for BP work up.

## 2012-07-10 NOTE — MAU Note (Signed)
Patient sent from MFM for evaluation of elevated blood pressure. Estimated weight of this baby in excess of 10 lbs. Patient is diabetic on insulin. Patient denies any contractions, leaking or bleeding and reports good fetal movement. Headaches on and off but not now.

## 2012-07-10 NOTE — MAU Note (Signed)
Dr. Dareen Piano called [id number], notified labs wnl, awaiting urinalysis. BP's reviewed, RN to call Dr. Dareen Piano back once urine is back.

## 2012-07-14 ENCOUNTER — Encounter (HOSPITAL_COMMUNITY)
Admission: RE | Admit: 2012-07-14 | Discharge: 2012-07-14 | Disposition: A | Discharge status: Home / Self Care | Payer: Medicaid Other | Source: Ambulatory Visit | Attending: Obstetrics & Gynecology | Admitting: Obstetrics & Gynecology

## 2012-07-14 ENCOUNTER — Encounter (HOSPITAL_COMMUNITY): Payer: Self-pay

## 2012-07-14 LAB — SURGICAL PCR SCREEN: Staphylococcus aureus: NEGATIVE

## 2012-07-14 LAB — CBC
HCT: 32.2 % — ABNORMAL LOW (ref 36.0–46.0)
Hemoglobin: 10.5 g/dL — ABNORMAL LOW (ref 12.0–15.0)
RBC: 3.83 MIL/uL — ABNORMAL LOW (ref 3.87–5.11)
WBC: 11 10*3/uL — ABNORMAL HIGH (ref 4.0–10.5)

## 2012-07-14 LAB — TYPE AND SCREEN
ABO/RH(D): O POS
Antibody Screen: NEGATIVE

## 2012-07-14 NOTE — Patient Instructions (Addendum)
Your procedure is scheduled on:07/18/12  Enter through the Main Entrance at :1030am  Pick up desk phone and dial [phone] and inform us of your arrival.  Please call [phone] if you have any problems the morning of surgery.  Remember: Do not eat after midnight:Monday Clear liquids ok until 8am Tuesday   Take these meds the morning of surgery with a sip of water:Opeprazole, Wellbutrin Take 1/2 dosage insulin night before surgery.  DO NOT wear jewelry, eye make-up, lipstick,body lotion, or dark fingernail polish. Do not shave for 48 hours prior to surgery.  If you are to be admitted after surgery, leave suitcase in car until your room has been assigned. Patients discharged on the day of surgery will not be allowed to drive home.

## 2012-07-17 NOTE — H&P (Signed)
36 y.o. G 2/P1001  Estimated Date of Delivery: 07/30/12 admitted at [redacted] weeks gestation for primary cesarean delivery and tubal sterilization.    Prenatal Transfer Tool  Maternal Diabetes: Yes:  Diabetes Type:  Pre-pregnancy Genetic Screening: Declined Maternal Ultrasounds/Referrals: Normal Fetal Ultrasounds or other Referrals:  Referred to [facility] Fetal [facility] .  Last ultrasound showed EFW 4700 gms and polyhydramnios. Maternal Substance Abuse:  No Significant Maternal Medications:  Insulin 3 times a day.  HS dose 50 units of NPH. Significant Maternal Lab Results: Poor glucose control. Other Significant Pregnancy Complications:  Poorly controlled Type 2 DM in pregnancy with ultrasound diagnosis of macrosomia and polyhydramnios.  Verbal communication with Dr. Harlon Flor (MFM) who recommended cesarean delivery at 38 weeks without anmio.  Patient requests tubal sterilization.  Afebrile, VSS Heart and Lungs: No active disease Abdomen: soft, gravid, EFW >10 lbs. Cervical exam:  Closed.  Impression: 38 week pregnancy.  Pre pregnancy DM with poor control on insulin.  Macrosomia and Polyhydramnios.  Maternal Obesity.  Voluntary sterilization.  Plan:  C/S with Tubal sterilization.

## 2012-07-18 ENCOUNTER — Encounter (HOSPITAL_COMMUNITY)
Admission: RE | Disposition: A | Discharge status: Home / Self Care | Payer: Self-pay | Source: Ambulatory Visit | Attending: Obstetrics & Gynecology

## 2012-07-18 ENCOUNTER — Encounter (HOSPITAL_COMMUNITY): Payer: Self-pay | Admitting: Anesthesiology

## 2012-07-18 ENCOUNTER — Inpatient Hospital Stay (HOSPITAL_COMMUNITY)
Admission: RE | Admit: 2012-07-18 | Discharge: 2012-07-21 | DRG: 765 | Disposition: A | Discharge status: Home / Self Care | Payer: Medicaid Other | Source: Ambulatory Visit | Attending: Obstetrics & Gynecology | Admitting: Obstetrics & Gynecology

## 2012-07-18 ENCOUNTER — Inpatient Hospital Stay (HOSPITAL_COMMUNITY): Payer: Medicaid Other | Admitting: Anesthesiology

## 2012-07-18 ENCOUNTER — Encounter (HOSPITAL_COMMUNITY): Payer: Self-pay | Admitting: *Deleted

## 2012-07-18 DIAGNOSIS — IMO0002 Reserved for concepts with insufficient information to code with codable children: Secondary | ICD-10-CM

## 2012-07-18 DIAGNOSIS — Z302 Encounter for sterilization: Secondary | ICD-10-CM

## 2012-07-18 DIAGNOSIS — O99815 Abnormal glucose complicating the puerperium: Secondary | ICD-10-CM

## 2012-07-18 DIAGNOSIS — O3660X0 Maternal care for excessive fetal growth, unspecified trimester, not applicable or unspecified: Secondary | ICD-10-CM

## 2012-07-18 DIAGNOSIS — E119 Type 2 diabetes mellitus without complications: Secondary | ICD-10-CM | POA: Diagnosis present

## 2012-07-18 DIAGNOSIS — O409XX0 Polyhydramnios, unspecified trimester, not applicable or unspecified: Secondary | ICD-10-CM | POA: Diagnosis present

## 2012-07-18 DIAGNOSIS — O09529 Supervision of elderly multigravida, unspecified trimester: Secondary | ICD-10-CM | POA: Diagnosis present

## 2012-07-18 DIAGNOSIS — O2432 Unspecified pre-existing diabetes mellitus in childbirth: Principal | ICD-10-CM | POA: Diagnosis present

## 2012-07-18 LAB — PREPARE RBC (CROSSMATCH)

## 2012-07-18 LAB — GLUCOSE, CAPILLARY
Glucose-Capillary: 80 mg/dL (ref 70–99)
Glucose-Capillary: 83 mg/dL (ref 70–99)

## 2012-07-18 SURGERY — Surgical Case
Anesthesia: Spinal | Site: Abdomen | Laterality: Bilateral | Wound class: Clean Contaminated

## 2012-07-18 MED ORDER — SIMETHICONE 80 MG PO CHEW: 80.0000 mg | CHEWABLE_TABLET | ORAL | Status: DC | PRN

## 2012-07-18 MED ORDER — ONDANSETRON HCL 4 MG/2ML IJ SOLN
INTRAMUSCULAR | Status: AC
  Filled 2012-07-18: qty 2

## 2012-07-18 MED ORDER — FENTANYL CITRATE 0.05 MG/ML IJ SOLN
INTRAMUSCULAR | Status: AC
  Filled 2012-07-18: qty 2

## 2012-07-18 MED ORDER — BUPROPION HCL ER (SR) 150 MG PO TB12
150.0000 mg | ORAL_TABLET | Freq: Two times a day (BID) | ORAL | Status: DC
  Administered 2012-07-18 – 2012-07-21 (×6): 150 mg via ORAL
  Filled 2012-07-18 (×7): qty 1

## 2012-07-18 MED ORDER — SODIUM CHLORIDE 0.9 % IJ SOLN: 3.0000 mL | INTRAMUSCULAR | Status: DC | PRN

## 2012-07-18 MED ORDER — ONDANSETRON HCL 4 MG/2ML IJ SOLN
4.0000 mg | Freq: Three times a day (TID) | INTRAMUSCULAR | Status: DC | PRN
  Filled 2012-07-18: qty 2

## 2012-07-18 MED ORDER — MORPHINE SULFATE 0.5 MG/ML IJ SOLN
INTRAMUSCULAR | Status: AC
  Filled 2012-07-18: qty 10

## 2012-07-18 MED ORDER — MENTHOL 3 MG MT LOZG
1.0000 | LOZENGE | OROMUCOSAL | Status: DC | PRN
Start: 2012-07-18 — End: 2012-07-21

## 2012-07-18 MED ORDER — NALBUPHINE HCL 10 MG/ML IJ SOLN
5.0000 mg | INTRAMUSCULAR | Status: DC | PRN
  Filled 2012-07-18: qty 1

## 2012-07-18 MED ORDER — OXYTOCIN 10 UNIT/ML IJ SOLN
INTRAMUSCULAR | Status: AC
  Filled 2012-07-18: qty 1

## 2012-07-18 MED ORDER — IBUPROFEN 600 MG PO TABS
600.0000 mg | ORAL_TABLET | Freq: Four times a day (QID) | ORAL | Status: DC | PRN
  Filled 2012-07-18 (×7): qty 1

## 2012-07-18 MED ORDER — ONDANSETRON HCL 4 MG/2ML IJ SOLN
4.0000 mg | INTRAMUSCULAR | Status: DC | PRN
  Administered 2012-07-19: 4 mg via INTRAVENOUS

## 2012-07-18 MED ORDER — BUPIVACAINE IN DEXTROSE 0.75-8.25 % IT SOLN
INTRATHECAL | Status: AC
  Filled 2012-07-18: qty 2

## 2012-07-18 MED ORDER — INFLUENZA VIRUS VACC SPLIT PF IM SUSP
0.5000 mL | INTRAMUSCULAR | Status: AC
  Administered 2012-07-19: 0.5 mL via INTRAMUSCULAR

## 2012-07-18 MED ORDER — EPHEDRINE 5 MG/ML INJ
INTRAVENOUS | Status: AC
  Filled 2012-07-18: qty 10

## 2012-07-18 MED ORDER — SODIUM CHLORIDE 0.9 % IV SOLN
1.0000 ug/kg/h | INTRAVENOUS | Status: DC | PRN
  Filled 2012-07-18: qty 2.5

## 2012-07-18 MED ORDER — LACTATED RINGERS IV SOLN
Freq: Once | INTRAVENOUS | Status: AC
  Administered 2012-07-18 (×4): via INTRAVENOUS

## 2012-07-18 MED ORDER — KETOROLAC TROMETHAMINE 60 MG/2ML IM SOLN
60.0000 mg | Freq: Once | INTRAMUSCULAR | Status: AC | PRN
  Administered 2012-07-18: 60 mg via INTRAMUSCULAR

## 2012-07-18 MED ORDER — FENTANYL CITRATE 0.05 MG/ML IJ SOLN
INTRAMUSCULAR | Status: DC | PRN
  Administered 2012-07-18: 15 ug via INTRATHECAL
  Administered 2012-07-18: 25 ug via INTRAVENOUS
  Administered 2012-07-18: 60 ug via INTRAVENOUS

## 2012-07-18 MED ORDER — BUPIVACAINE IN DEXTROSE 0.75-8.25 % IT SOLN
INTRATHECAL | Status: DC | PRN
  Administered 2012-07-18: 1.4 mL via INTRATHECAL

## 2012-07-18 MED ORDER — ONDANSETRON HCL 4 MG/2ML IJ SOLN
INTRAMUSCULAR | Status: DC | PRN
  Administered 2012-07-18: 4 mg via INTRAVENOUS

## 2012-07-18 MED ORDER — OXYTOCIN 10 UNIT/ML IJ SOLN
INTRAMUSCULAR | Status: DC | PRN
  Administered 2012-07-18: 40 [IU] via INTRAMUSCULAR

## 2012-07-18 MED ORDER — SIMETHICONE 80 MG PO CHEW
80.0000 mg | CHEWABLE_TABLET | Freq: Three times a day (TID) | ORAL | Status: DC
  Administered 2012-07-18 – 2012-07-21 (×7): 80 mg via ORAL

## 2012-07-18 MED ORDER — PNEUMOCOCCAL VAC POLYVALENT 25 MCG/0.5ML IJ INJ
0.5000 mL | INJECTION | INTRAMUSCULAR | Status: AC
  Filled 2012-07-18: qty 0.5

## 2012-07-18 MED ORDER — ACETAMINOPHEN 10 MG/ML IV SOLN
1000.0000 mg | Freq: Four times a day (QID) | INTRAVENOUS | Status: DC
  Administered 2012-07-18: 1000 mg via INTRAVENOUS
  Filled 2012-07-18: qty 100

## 2012-07-18 MED ORDER — TETANUS-DIPHTH-ACELL PERTUSSIS 5-2.5-18.5 LF-MCG/0.5 IM SUSP
0.5000 mL | Freq: Once | INTRAMUSCULAR | Status: AC
  Administered 2012-07-19: 0.5 mL via INTRAMUSCULAR

## 2012-07-18 MED ORDER — LACTATED RINGERS IV SOLN
INTRAVENOUS | Status: DC
  Administered 2012-07-18: 12:00:00 via INTRAVENOUS

## 2012-07-18 MED ORDER — OXYTOCIN 40 UNITS IN LACTATED RINGERS INFUSION - SIMPLE MED: 62.5000 mL/h | INTRAVENOUS | Status: AC

## 2012-07-18 MED ORDER — SCOPOLAMINE 1 MG/3DAYS TD PT72
MEDICATED_PATCH | TRANSDERMAL | Status: AC
  Administered 2012-07-18: 1.5 mg via TRANSDERMAL
  Filled 2012-07-18: qty 1

## 2012-07-18 MED ORDER — NALOXONE HCL 0.4 MG/ML IJ SOLN: 0.4000 mg | INTRAMUSCULAR | Status: DC | PRN

## 2012-07-18 MED ORDER — SCOPOLAMINE 1 MG/3DAYS TD PT72
1.0000 | MEDICATED_PATCH | Freq: Once | TRANSDERMAL | Status: DC
  Administered 2012-07-18: 1.5 mg via TRANSDERMAL

## 2012-07-18 MED ORDER — KETOROLAC TROMETHAMINE 60 MG/2ML IM SOLN
INTRAMUSCULAR | Status: AC
  Administered 2012-07-18: 60 mg via INTRAMUSCULAR
  Filled 2012-07-18: qty 2

## 2012-07-18 MED ORDER — SENNOSIDES-DOCUSATE SODIUM 8.6-50 MG PO TABS
2.0000 | ORAL_TABLET | Freq: Every day | ORAL | Status: DC
  Administered 2012-07-18 – 2012-07-20 (×3): 2 via ORAL

## 2012-07-18 MED ORDER — LORATADINE 10 MG PO TABS
10.0000 mg | ORAL_TABLET | Freq: Every day | ORAL | Status: DC
  Administered 2012-07-20 – 2012-07-21 (×2): 10 mg via ORAL
  Filled 2012-07-18 (×4): qty 1

## 2012-07-18 MED ORDER — DIPHENHYDRAMINE HCL 25 MG PO CAPS
25.0000 mg | ORAL_CAPSULE | Freq: Four times a day (QID) | ORAL | Status: DC | PRN
  Filled 2012-07-18: qty 1

## 2012-07-18 MED ORDER — KETOROLAC TROMETHAMINE 30 MG/ML IJ SOLN
30.0000 mg | Freq: Four times a day (QID) | INTRAMUSCULAR | Status: AC | PRN
  Administered 2012-07-18: 30 mg via INTRAVENOUS
  Filled 2012-07-18: qty 1

## 2012-07-18 MED ORDER — DIPHENHYDRAMINE HCL 50 MG/ML IJ SOLN: 25.0000 mg | INTRAMUSCULAR | Status: DC | PRN

## 2012-07-18 MED ORDER — ZOLPIDEM TARTRATE 5 MG PO TABS: 5.0000 mg | ORAL_TABLET | Freq: Every evening | ORAL | Status: DC | PRN

## 2012-07-18 MED ORDER — OXYCODONE-ACETAMINOPHEN 5-325 MG PO TABS
1.0000 | ORAL_TABLET | ORAL | Status: DC | PRN
  Administered 2012-07-19 – 2012-07-20 (×5): 2 via ORAL
  Administered 2012-07-20: 1 via ORAL
  Administered 2012-07-20 – 2012-07-21 (×5): 2 via ORAL
  Filled 2012-07-18 (×5): qty 2
  Filled 2012-07-18: qty 1
  Filled 2012-07-18: qty 2
  Filled 2012-07-18: qty 1
  Filled 2012-07-18 (×2): qty 2
  Filled 2012-07-18: qty 1
  Filled 2012-07-18: qty 2

## 2012-07-18 MED ORDER — LANOLIN HYDROUS EX OINT: 1.0000 "application " | TOPICAL_OINTMENT | CUTANEOUS | Status: DC | PRN

## 2012-07-18 MED ORDER — CLINDAMYCIN PHOSPHATE 900 MG/50ML IV SOLN
900.0000 mg | Freq: Once | INTRAVENOUS | Status: AC
  Administered 2012-07-18: 900 mg via INTRAVENOUS
  Filled 2012-07-18: qty 50

## 2012-07-18 MED ORDER — WITCH HAZEL-GLYCERIN EX PADS: 1.0000 "application " | MEDICATED_PAD | CUTANEOUS | Status: DC | PRN

## 2012-07-18 MED ORDER — IBUPROFEN 600 MG PO TABS
600.0000 mg | ORAL_TABLET | Freq: Four times a day (QID) | ORAL | Status: DC
  Administered 2012-07-19 – 2012-07-21 (×9): 600 mg via ORAL
  Filled 2012-07-18 (×2): qty 1

## 2012-07-18 MED ORDER — LACTATED RINGERS IV SOLN
INTRAVENOUS | Status: DC
  Administered 2012-07-18: 20:00:00 via INTRAVENOUS

## 2012-07-18 MED ORDER — INSULIN REGULAR HUMAN 100 UNIT/ML IJ SOLN: 10.0000 [IU] | Freq: Two times a day (BID) | INTRAMUSCULAR | Status: AC | PRN

## 2012-07-18 MED ORDER — PHENYLEPHRINE 40 MCG/ML (10ML) SYRINGE FOR IV PUSH (FOR BLOOD PRESSURE SUPPORT)
PREFILLED_SYRINGE | INTRAVENOUS | Status: AC
  Filled 2012-07-18: qty 5

## 2012-07-18 MED ORDER — MEPERIDINE HCL 25 MG/ML IJ SOLN: 6.2500 mg | INTRAMUSCULAR | Status: DC | PRN

## 2012-07-18 MED ORDER — ONDANSETRON HCL 4 MG PO TABS
4.0000 mg | ORAL_TABLET | ORAL | Status: DC | PRN
  Administered 2012-07-20: 4 mg via ORAL
  Filled 2012-07-18: qty 1

## 2012-07-18 MED ORDER — DIBUCAINE 1 % RE OINT: 1.0000 "application " | TOPICAL_OINTMENT | RECTAL | Status: DC | PRN

## 2012-07-18 MED ORDER — MORPHINE SULFATE (PF) 0.5 MG/ML IJ SOLN
INTRAMUSCULAR | Status: DC | PRN
  Administered 2012-07-18: .1 mg via INTRATHECAL

## 2012-07-18 MED ORDER — DIPHENHYDRAMINE HCL 25 MG PO CAPS
25.0000 mg | ORAL_CAPSULE | ORAL | Status: DC | PRN
  Filled 2012-07-18: qty 1

## 2012-07-18 MED ORDER — FENTANYL CITRATE 0.05 MG/ML IJ SOLN: 25.0000 ug | INTRAMUSCULAR | Status: DC | PRN

## 2012-07-18 MED ORDER — SCOPOLAMINE 1 MG/3DAYS TD PT72: 1.0000 | MEDICATED_PATCH | Freq: Once | TRANSDERMAL | Status: DC

## 2012-07-18 MED ORDER — METOCLOPRAMIDE HCL 5 MG/ML IJ SOLN: 10.0000 mg | Freq: Three times a day (TID) | INTRAMUSCULAR | Status: DC | PRN

## 2012-07-18 MED ORDER — PANTOPRAZOLE SODIUM 40 MG PO TBEC
40.0000 mg | DELAYED_RELEASE_TABLET | Freq: Every day | ORAL | Status: DC
  Administered 2012-07-20 – 2012-07-21 (×2): 40 mg via ORAL
  Filled 2012-07-18 (×4): qty 1

## 2012-07-18 MED ORDER — DIPHENHYDRAMINE HCL 50 MG/ML IJ SOLN: 12.5000 mg | INTRAMUSCULAR | Status: DC | PRN

## 2012-07-18 MED ORDER — PRENATAL MULTIVITAMIN CH
1.0000 | ORAL_TABLET | Freq: Every day | ORAL | Status: DC
  Administered 2012-07-19 – 2012-07-21 (×3): 1 via ORAL
  Filled 2012-07-18 (×3): qty 1

## 2012-07-18 MED ORDER — KETOROLAC TROMETHAMINE 30 MG/ML IJ SOLN: 30.0000 mg | Freq: Four times a day (QID) | INTRAMUSCULAR | Status: AC | PRN

## 2012-07-18 SURGICAL SUPPLY — 33 items
CLIP FILSHIE TUBAL LIGA STRL (Clip) ×1 IMPLANT
CLOTH BEACON ORANGE TIMEOUT ST (SAFETY) ×2 IMPLANT
DRAPE SURG 17X23 STRL (DRAPES) ×2 IMPLANT
DRSG COVADERM 4X10 (GAUZE/BANDAGES/DRESSINGS) ×1 IMPLANT
DURAPREP 26ML APPLICATOR (WOUND CARE) ×2 IMPLANT
ELECT REM PT RETURN 9FT ADLT (ELECTROSURGICAL) ×2
ELECTRODE REM PT RTRN 9FT ADLT (ELECTROSURGICAL) ×1 IMPLANT
EXTRACTOR VACUUM M CUP 4 TUBE (SUCTIONS) ×1 IMPLANT
GLOVE ECLIPSE 6.0 STRL STRAW (GLOVE) ×2 IMPLANT
GLOVE ECLIPSE 6.5 STRL STRAW (GLOVE) ×2 IMPLANT
GOWN PREVENTION PLUS LG XLONG (DISPOSABLE) ×6 IMPLANT
NDL HYPO 25X5/8 SAFETYGLIDE (NEEDLE) ×1 IMPLANT
NEEDLE HYPO 25X5/8 SAFETYGLIDE (NEEDLE) ×2 IMPLANT
NS IRRIG 1000ML POUR BTL (IV SOLUTION) ×2 IMPLANT
PACK C SECTION WH (CUSTOM PROCEDURE TRAY) ×2 IMPLANT
PAD OB MATERNITY 4.3X12.25 (PERSONAL CARE ITEMS) ×1 IMPLANT
RTRCTR C-SECT PINK 25CM LRG (MISCELLANEOUS) ×1 IMPLANT
STAPLER VISISTAT 35W (STAPLE) ×1 IMPLANT
SUT PDS AB 0 CTX 60 (SUTURE) ×1 IMPLANT
SUT PLAIN 0 NONE (SUTURE) IMPLANT
SUT PLAIN 2 0 XLH (SUTURE) ×1 IMPLANT
SUT VIC AB 0 CT1 27 (SUTURE) ×6
SUT VIC AB 0 CT1 27XBRD ANBCTR (SUTURE) ×3 IMPLANT
SUT VIC AB 1 CTX 36 (SUTURE) ×4
SUT VIC AB 1 CTX36XBRD ANBCTRL (SUTURE) ×2 IMPLANT
SUT VIC AB 3-0 CT1 27 (SUTURE) ×2
SUT VIC AB 3-0 CT1 TAPERPNT 27 (SUTURE) ×1 IMPLANT
SUT VIC AB 3-0 PS2 18 (SUTURE)
SUT VIC AB 3-0 PS2 18XBRD (SUTURE) IMPLANT
SUT VIC AB 3-0 SH 27 (SUTURE)
SUT VIC AB 3-0 SH 27X BRD (SUTURE) IMPLANT
TOWEL OR 17X24 6PK STRL BLUE (TOWEL DISPOSABLE) ×4 IMPLANT
TRAY FOLEY CATH 14FR (SET/KITS/TRAYS/PACK) ×2 IMPLANT

## 2012-07-18 NOTE — Anesthesia Postprocedure Evaluation (Signed)
Anesthesia Post Note  Patient: [name]  Procedure(s) Performed: Procedure(s) (LRB): CESAREAN SECTION WITH BILATERAL TUBAL LIGATION (Bilateral)  Anesthesia type: Spinal  Patient location: PACU  Post pain: Pain level controlled  Post assessment: Post-op Vital signs reviewed  Last Vitals:  Filed Vitals:   07/18/12 1510  BP:   Pulse: 78  Temp: 36.9 C  Resp: 18    Post vital signs: Reviewed  Level of consciousness: awake  Complications: No apparent anesthesia complications

## 2012-07-18 NOTE — Anesthesia Postprocedure Evaluation (Signed)
Anesthesia Post Note  Patient: [name]  Procedure(s) Performed: Procedure(s) (LRB): CESAREAN SECTION WITH BILATERAL TUBAL LIGATION (Bilateral)  Anesthesia type: Spinal  Patient location: Mother/Baby  Post pain: Pain level controlled  Post assessment: Post-op Vital signs reviewed  Last Vitals:  Filed Vitals:   07/18/12 1541  BP: 159/91  Pulse:   Temp: 36.8 C  Resp: 20    Post vital signs: Reviewed  Level of consciousness: awake  Complications: No apparent anesthesia complications

## 2012-07-18 NOTE — Anesthesia Procedure Notes (Signed)
Spinal  Patient location during procedure: OR Start time: 07/18/2012 12:54 PM Staffing Performed by: anesthesiologist  Preanesthetic Checklist Completed: patient identified, site marked, surgical consent, pre-op evaluation, timeout performed, IV checked, risks and benefits discussed and monitors and equipment checked Spinal Block Patient position: sitting Prep: site prepped and draped and DuraPrep Patient monitoring: cardiac monitor, continuous pulse ox, blood pressure and heart rate Approach: midline Location: L3-4 Injection technique: catheter Needle Needle type: Tuohy and Sprotte  Needle gauge: 24 G Needle length: 12.7 cm Needle insertion depth: 8 cm Catheter type: closed end flexible Catheter size: 19 g Catheter at skin depth: 13 cm Additional Notes CSE for planned vertical incision.  LOR to air with tuohy at 8 cm, 25 ga pencan via tuohy with clear free flow CSF on first attempt.  No paresthesia.  Pencan removed and epidural catheter threaded, secured at 13.5 cm at skin.  Patient tolerated procedure well.  Jasmine December, MD

## 2012-07-18 NOTE — Op Note (Addendum)
Patient Name: TAYLORRAE QUALLEY MRN: [id number]  Date of Surgery: 07/18/2012    PREOPERATIVE DIAGNOSIS: SUSPECTED MACROSOMIA DESIRES STERILIZATION  POSTOPERATIVE DIAGNOSIS:  MACROSOMIA, DESIRES STERILIZATION   PROCEDURE: Low transverse cesarean section, Tubal sterilization  SURGEON: Caralyn Guile. Arlyce Dice M.D.  ASSISTANT: Philip Aspen, D.O.  ANESTHESIA: Spinal  ESTIMATED BLOOD LOSS: 1000 ml  FINDINGS: Female, Apgar 9,9; BW 11 lbs 2 oz; clear fluid, normal adnexa.   INDICATIONS:  Type 2 DM on insulin during pregnancy with poor glucose control.  Ultrasound at 37 weeks showed macrosomia and polyhydramnios.  Per MFM consult, will proceed with primary cesarean delivery at 38 weeks without amniocentesis to confirm FLM.  PROCEDURE IN DETAIL: The patient was taken to the operating room and spinal anesthesia was placed.  She was then placed in the supine position with left lateral displacement of the uterus. The abdomen was prepped and draped in a sterile fashion and the bladder was catheterized.  A right paramedian incision was made above the panus and the peritoneal cavity was entered.  The fascia and peritoneal incisions were opened vertically. An Alexis retractor was placed and the lower uterine segment was identified, entered transversely by careful sharp dissection, and extended bluntly.  The infant was delivered with the aid of a vacuum extractor. The placenta was delivered and the uterus was bluntly curettage. The lower segment was closed with running interlocking Vicryl 1 suture.  The fallopian tubes were elevated with Babcock clamps and occluded with Filshie clips.  The peritoneum and fascia were closed in the midline with running looped PDS suture. The subcutaneous tissue was closed with 2-0 plain catgut suture and the skin was closed with staples. All sponge and instrument counts were correct.  The patient tolerated the procedure well and left the operating room in good condition.

## 2012-07-18 NOTE — Anesthesia Preprocedure Evaluation (Signed)
Anesthesia Evaluation  Patient identified by MRN, date of birth, ID band Patient awake    Reviewed: Allergy & Precautions, H&P , NPO status , Patient's Chart, lab work & pertinent test results, reviewed documented beta blocker date and time   History of Anesthesia Complications Negative for: history of anesthetic complications  Airway Mallampati: III TM Distance: >3 FB Neck ROM: full    Dental  (+) Teeth Intact   Pulmonary asthma (last inhaler use 2 years ago) ,  breath sounds clear to auscultation        Cardiovascular hypertension, Rhythm:regular Rate:Normal     Neuro/Psych  Headaches (migraines, currently has a headache), PSYCHIATRIC DISORDERS (depression)    GI/Hepatic Neg liver ROS, GERD-  Medicated,  Endo/Other  diabetes, Type 2, Insulin DependentMorbid obesity  Renal/GU negative Renal ROS     Musculoskeletal   Abdominal   Peds  Hematology negative hematology ROS (+)   Anesthesia Other Findings   Reproductive/Obstetrics (+) Pregnancy                           Anesthesia Physical Anesthesia Plan  ASA: III  Anesthesia Plan: Spinal   Post-op Pain Management:    Induction:   Airway Management Planned:   Additional Equipment:   Intra-op Plan:   Post-operative Plan:   Informed Consent: I have reviewed the patients History and Physical, chart, labs and discussed the procedure including the risks, benefits and alternatives for the proposed anesthesia with the patient or authorized representative who has indicated his/her understanding and acceptance.     Plan Discussed with: Surgeon and CRNA  Anesthesia Plan Comments:         Anesthesia Quick Evaluation

## 2012-07-18 NOTE — Addendum Note (Signed)
Addendum  created 07/18/12 1621 by Algis Greenhouse, CRNA   Modules edited:Notes Section

## 2012-07-18 NOTE — Transfer of Care (Signed)
Immediate Anesthesia Transfer of Care Note  Patient: [name]  Procedure(s) Performed: Procedure(s) (LRB) with comments: CESAREAN SECTION WITH BILATERAL TUBAL LIGATION (Bilateral)  Patient Location: PACU  Anesthesia Type:Epidural  Level of Consciousness: awake, alert , oriented and patient cooperative  Airway & Oxygen Therapy: Patient Spontanous Breathing  Post-op Assessment: Report given to PACU RN, Post -op Vital signs reviewed and stable and Patient moving all extremities X 4  Post vital signs: Reviewed and stable  Complications: No apparent anesthesia complications

## 2012-07-19 ENCOUNTER — Encounter (HOSPITAL_COMMUNITY): Payer: Self-pay | Admitting: Obstetrics & Gynecology

## 2012-07-19 LAB — CBC
MCH: 27.1 pg (ref 26.0–34.0)
MCHC: 32.3 g/dL (ref 30.0–36.0)
Platelets: 160 10*3/uL (ref 150–400)

## 2012-07-19 LAB — GLUCOSE, CAPILLARY: Glucose-Capillary: 148 mg/dL — ABNORMAL HIGH (ref 70–99)

## 2012-07-19 NOTE — Progress Notes (Signed)
Patient is eating, ambulating, voiding.  Pain control is good. +flatus.  Appropriate lochia.  Filed Vitals:   07/18/12 2158 07/18/12 2308 07/19/12 0011 07/19/12 0500  BP: 147/92 148/88 148/88 152/80  Pulse: 104 94 98 89  Temp:   98 F (36.7 C) 97.4 F (36.3 C)  TempSrc:   Oral Oral  Resp:  18 20 20   Weight:      SpO2:  97% 98% 97%    Fundus firm Inc: minimal drainage on dressing Ext: No CT  Lab Results  Component Value Date   WBC 10.5 07/19/2012   HGB 9.0* 07/19/2012   HCT 27.9* 07/19/2012   MCV 84.0 07/19/2012   PLT 160 07/19/2012    --/--/O POS (10/29 1026)  A/P Postop day #1 Circ in office BP- no severe range since 10pm last night, will continue to monitor. DM - continue BS check 4x qd, insulin as ordered.  Morning BS 116. Discussed with pt future follow up with endocrinology. Smoking cessation discussed.  Routine care.   Philip Aspen

## 2012-07-19 NOTE — Progress Notes (Signed)
UR chart review completed.  

## 2012-07-20 LAB — GLUCOSE, CAPILLARY: Glucose-Capillary: 124 mg/dL — ABNORMAL HIGH (ref 70–99)

## 2012-07-21 LAB — TYPE AND SCREEN
ABO/RH(D): O POS
Unit division: 0

## 2012-07-21 MED ORDER — LABETALOL HCL 100 MG PO TABS: 200.0000 mg | ORAL_TABLET | Freq: Two times a day (BID) | ORAL | Status: DC

## 2012-07-21 MED ORDER — DOCUSATE SODIUM 100 MG PO CAPS: 100.0000 mg | ORAL_CAPSULE | Freq: Two times a day (BID) | ORAL | Status: DC

## 2012-07-21 MED ORDER — IBUPROFEN 600 MG PO TABS
600.0000 mg | ORAL_TABLET | Freq: Four times a day (QID) | ORAL | Status: DC | PRN
Start: 2012-07-21 — End: 2014-04-20

## 2012-07-21 MED ORDER — GLYBURIDE 5 MG PO TABS: 5.0000 mg | ORAL_TABLET | Freq: Every day | ORAL | Status: DC

## 2012-07-21 MED ORDER — INSULIN NPH (HUMAN) (ISOPHANE) 100 UNIT/ML ~~LOC~~ SUSP: 10.0000 [IU] | Freq: Every day | SUBCUTANEOUS | Status: DC

## 2012-07-21 MED ORDER — OXYCODONE-ACETAMINOPHEN 5-325 MG PO TABS: 2.0000 | ORAL_TABLET | ORAL | Status: DC | PRN

## 2012-07-21 NOTE — Discharge Summary (Addendum)
Obstetric Discharge Summary Reason for Admission: cesarean section Prenatal Procedures: NST and ultrasound Intrapartum Procedures: cesarean: low cervical, transverse and Vertical Skin Incision, bilateral tubal ligation Postpartum Procedures: none Complications-Operative and Postpartum: none Hemoglobin  Date Value Range Status  07/19/2012 9.0* 12.0 - 15.0 g/dL Final     HCT  Date Value Range Status  07/19/2012 27.9* 36.0 - 46.0 % Final    Physical Exam:  General: alert, cooperative and appears stated age 36: appropriate Uterine Fundus: firm Incision: healing well DVT Evaluation: No evidence of DVT seen on physical exam.  Discharge Diagnoses: Term Pregnancy-delivered and Maternal Diabetes Mellitus  Discharge Information: Date: 07/21/2012 Activity: pelvic rest Diet: Diabetic Medications: Ibuprofen, Colace and Percocet, labetalol, NPH Insulin Condition: improved Instructions: refer to practice specific booklet Discharge to: home Follow-up Information    Follow up with Mickel Baas, MD. In 1 week. (For a BP check)    Contact information:   [address] [place] [address] [phone]       Follow up with Mickel Baas, MD. In 4 weeks. (For a postpartum evaluation)    Contact information:   [address] [place] [address] [phone]        Follow-Up with Dr. Arlyce Dice on 07/26/2012 @ 2:45 for a BP check and staple removal  Newborn Data: Live born female  Birth Weight: 11 lb 2 oz (5045 g) APGAR: 7, 9  Home with mother.  Feliza Diven H. 07/21/2012, 9:44 AM

## 2012-07-21 NOTE — Discharge Summary (Signed)
Obstetric Discharge Summary Reason for Admission: cesarean section Prenatal Procedures: NST and ultrasound Intrapartum Procedures: cesarean: low cervical, transverse and bilateral tubal ligation Postpartum Procedures: none Complications-Operative and Postpartum: none Hemoglobin  Date Value Range Status  07/19/2012 9.0* 12.0 - 15.0 g/dL Final     HCT  Date Value Range Status  07/19/2012 27.9* 36.0 - 46.0 % Final    Physical Exam:  General: alert, cooperative and appears stated age 36: appropriate Uterine Fundus: firm Incision: healing well DVT Evaluation: No evidence of DVT seen on physical exam.  Discharge Diagnoses: Term Pregnancy-delivered and Diabetes Mellitus  Discharge Information: Date: 07/21/2012 Activity: pelvic rest Diet: routine Medications: Ibuprofen, Colace, Percocet and labetalol Condition: improved Instructions: refer to practice specific booklet Discharge to: home Follow-up Information    Follow up with Mickel Baas, MD. In 1 week. (For a BP check)    Contact information:   [address] [place] [address] [phone]       Follow up with Mickel Baas, MD. In 4 weeks. (For a postpartum evaluation)    Contact information:   [address] [place] [address] [phone]          Newborn Data: Live born female  Birth Weight: 11 lb 2 oz (5045 g) APGAR: 7, 9  Home with mother.  Mardee Clune H. 07/21/2012, 8:56 AM

## 2012-08-02 ENCOUNTER — Encounter: Payer: Self-pay | Admitting: Cardiology

## 2013-07-08 ENCOUNTER — Emergency Department (HOSPITAL_COMMUNITY)
Admission: EM | Admit: 2013-07-08 | Discharge: 2013-07-08 | Disposition: A | Discharge status: Home / Self Care | Payer: Medicaid Other | Attending: Emergency Medicine | Admitting: Emergency Medicine

## 2013-07-08 ENCOUNTER — Encounter (HOSPITAL_COMMUNITY): Payer: Self-pay | Admitting: Emergency Medicine

## 2013-07-08 DIAGNOSIS — R220 Localized swelling, mass and lump, head: Secondary | ICD-10-CM | POA: Insufficient documentation

## 2013-07-08 DIAGNOSIS — J45909 Unspecified asthma, uncomplicated: Secondary | ICD-10-CM | POA: Insufficient documentation

## 2013-07-08 DIAGNOSIS — E119 Type 2 diabetes mellitus without complications: Secondary | ICD-10-CM | POA: Insufficient documentation

## 2013-07-08 DIAGNOSIS — Z87891 Personal history of nicotine dependence: Secondary | ICD-10-CM | POA: Insufficient documentation

## 2013-07-08 DIAGNOSIS — K047 Periapical abscess without sinus: Secondary | ICD-10-CM

## 2013-07-08 DIAGNOSIS — F329 Major depressive disorder, single episode, unspecified: Secondary | ICD-10-CM | POA: Insufficient documentation

## 2013-07-08 DIAGNOSIS — I10 Essential (primary) hypertension: Secondary | ICD-10-CM | POA: Insufficient documentation

## 2013-07-08 DIAGNOSIS — K044 Acute apical periodontitis of pulpal origin: Secondary | ICD-10-CM | POA: Insufficient documentation

## 2013-07-08 DIAGNOSIS — Z79899 Other long term (current) drug therapy: Secondary | ICD-10-CM | POA: Insufficient documentation

## 2013-07-08 DIAGNOSIS — Z880 Allergy status to penicillin: Secondary | ICD-10-CM | POA: Insufficient documentation

## 2013-07-08 DIAGNOSIS — K029 Dental caries, unspecified: Secondary | ICD-10-CM | POA: Insufficient documentation

## 2013-07-08 DIAGNOSIS — F3289 Other specified depressive episodes: Secondary | ICD-10-CM | POA: Insufficient documentation

## 2013-07-08 DIAGNOSIS — G43909 Migraine, unspecified, not intractable, without status migrainosus: Secondary | ICD-10-CM | POA: Insufficient documentation

## 2013-07-08 DIAGNOSIS — Z794 Long term (current) use of insulin: Secondary | ICD-10-CM | POA: Insufficient documentation

## 2013-07-08 MED ORDER — CEPHALEXIN 500 MG PO CAPS
500.0000 mg | ORAL_CAPSULE | Freq: Once | ORAL | Status: AC
  Administered 2013-07-08: 500 mg via ORAL
  Filled 2013-07-08: qty 1

## 2013-07-08 MED ORDER — HYDROCODONE-ACETAMINOPHEN 5-325 MG PO TABS: 1.0000 | ORAL_TABLET | ORAL | Status: DC | PRN

## 2013-07-08 MED ORDER — CEPHALEXIN 500 MG PO CAPS: 500.0000 mg | ORAL_CAPSULE | Freq: Four times a day (QID) | ORAL | Status: DC

## 2013-07-08 NOTE — ED Notes (Signed)
Pt reports right upper tooth pain that started yesterday. Pt unable to see her dentist yet.

## 2013-07-08 NOTE — ED Notes (Signed)
Pt c/o pain to a tooth in the uppr right side of her mouth.

## 2013-07-08 NOTE — ED Notes (Signed)
Pt alert & oriented x4, stable gait. Patient given discharge instructions, paperwork & prescription(s). Patient  instructed to stop at the registration desk to finish any additional paperwork. Patient verbalized understanding. Pt left department w/ no further questions. 

## 2013-07-08 NOTE — ED Provider Notes (Signed)
CSN: [id number]     Arrival date & time 07/08/13  1904 History   First MD Initiated Contact with Patient 07/08/13 1922     Chief Complaint  Patient presents with  . Dental Pain   (Consider location/radiation/quality/duration/timing/severity/associated sxs/prior Treatment) Patient is a 37 y.o. female presenting with tooth pain. The history is provided by the patient.  Dental Pain Location:  Upper Upper teeth location:  5/RU 1st bicuspid Quality:  Constant, throbbing and sharp Severity:  Moderate Onset quality:  Gradual Duration:  2 days Timing:  Constant Progression:  Worsening Chronicity:  Recurrent Context: abscess and dental caries   Context comment:  She reports gingival swelling above the tooth last week which partially resolved spontaneously.   Relieved by:  Nothing Worsened by:  Hot food/drink Ineffective treatments:  Acetaminophen Associated symptoms: facial pain and gum swelling   Associated symptoms: no facial swelling, no fever, no neck pain, no neck swelling and no trismus     Past Medical History  Diagnosis Date  . Hypertension   . Hyperlipidemia   . GERD (gastroesophageal reflux disease)   . Asthma     no inhaler  . Depression   . Diabetes mellitus     on insulin during this pregnancy  . Migraine headache    Past Surgical History  Procedure Laterality Date  . Cholecystectomy    . Wisdom tooth extraction    . Cesarean section with bilateral tubal ligation  07/18/2012    Procedure: CESAREAN SECTION WITH BILATERAL TUBAL LIGATION;  Surgeon: Mickel Baas, MD;  Location: [facility];  Service: Obstetrics;  Laterality: Bilateral;   Family History  Problem Relation Age of Onset  . Heart disease Mother   . COPD Mother   . Hypertension Mother   . Hyperlipidemia Mother   . Hearing loss Maternal Grandmother    History  Substance Use Topics  . Smoking status: Former Smoker -- 0.25 packs/day  . Smokeless tobacco: Never Used  . Alcohol Use: No   OB History    Grav Para Term Preterm Abortions TAB SAB Ect Mult Living   3 2 2  0 1 0 1 0 0 2     Review of Systems  Constitutional: Negative for fever.  HENT: Positive for dental problem. Negative for facial swelling and sore throat.   Respiratory: Negative for shortness of breath.   Musculoskeletal: Negative for neck pain and neck stiffness.    Allergies  Levofloxacin; Penicillins; Shellfish allergy; Sulfa antibiotics; Tequin; Imitrex; and Betadine  Home Medications   Current Outpatient Rx  Name  Route  Sig  Dispense  Refill  . buPROPion (WELLBUTRIN SR) 150 MG 12 hr tablet   Oral   Take 150 mg by mouth 2 (two) times daily.         . cephALEXin (KEFLEX) 500 MG capsule   Oral   Take 1 capsule (500 mg total) by mouth 4 (four) times daily.   28 capsule   0   . cetirizine (ZYRTEC) 10 MG tablet   Oral   Take 10 mg by mouth daily as needed. For allergies         . docusate sodium (COLACE) 100 MG capsule   Oral   Take 1 capsule (100 mg total) by mouth 2 (two) times daily.   60 capsule   0   . folic acid (FOLVITE) 800 MCG tablet   Oral   Take 800 mcg by mouth 2 (two) times daily.         Marland Kitchen  HYDROcodone-acetaminophen (NORCO/VICODIN) 5-325 MG per tablet   Oral   Take 1 tablet by mouth every 4 (four) hours as needed for pain.   15 tablet   0   . ibuprofen (ADVIL,MOTRIN) 600 MG tablet   Oral   Take 1 tablet (600 mg total) by mouth every 6 (six) hours as needed for pain.   90 tablet   0   . insulin NPH (HUMULIN N) 100 UNIT/ML injection   Subcutaneous   Inject 10 Units into the skin at bedtime.   1 vial   12   . labetalol (NORMODYNE) 100 MG tablet   Oral   Take 2 tablets (200 mg total) by mouth 2 (two) times daily.   60 tablet   1   . oxyCODONE-acetaminophen (ROXICET) 5-325 MG per tablet   Oral   Take 2 tablets by mouth every 4 (four) hours as needed for pain. May take 1-2 tablets every 4-6 hours as needed for pain   46 tablet   0    BP 139/82  Pulse 106   Temp(Src) 97.4 F (36.3 C) (Oral)  Resp 18  Ht 5\' 9"  (1.753 m)  Wt 249 lb (112.946 kg)  BMI 36.75 kg/m2  SpO2 98%  LMP 07/01/2013 Physical Exam  Constitutional: She is oriented to person, place, and time. She appears well-developed and well-nourished. No distress.  HENT:  Head: Normocephalic and atraumatic.  Right Ear: Tympanic membrane and external ear normal.  Left Ear: Tympanic membrane and external ear normal.  Mouth/Throat: Oropharynx is clear and moist and mucous membranes are normal. No oral lesions. Dental abscesses and dental caries present.  Eyes: Conjunctivae are normal.  Neck: Normal range of motion. Neck supple.  Cardiovascular: Normal rate and normal heart sounds.   Pulmonary/Chest: Effort normal.  Abdominal: She exhibits no distension.  Musculoskeletal: Normal range of motion.  Lymphadenopathy:    She has no cervical adenopathy.  Neurological: She is alert and oriented to person, place, and time.  Skin: Skin is warm and dry. No erythema.  Psychiatric: She has a normal mood and affect.    ED Course  Procedures (including critical care time) Labs Review Labs Reviewed - No data to display Imaging Review No results found.  EKG Interpretation   None       MDM   1. Dental infection    Pt prescribed keflex (says she has taken this med before without problems).  Hydrocodone.  Dental referrals given.    Burgess Amor, PA-C 07/08/13 2017

## 2013-07-08 NOTE — ED Notes (Signed)
PA at bedside.

## 2013-07-08 NOTE — ED Provider Notes (Signed)
  Medical screening examination/treatment/procedure(s) were performed by non-physician practitioner and as supervising physician I was immediately available for consultation/collaboration.    Jinna Weinman, MD 07/08/13 2111 

## 2013-08-16 ENCOUNTER — Encounter (HOSPITAL_COMMUNITY): Payer: Self-pay | Admitting: Emergency Medicine

## 2013-08-16 ENCOUNTER — Emergency Department (HOSPITAL_COMMUNITY)
Admission: EM | Admit: 2013-08-16 | Discharge: 2013-08-16 | Disposition: A | Discharge status: Home / Self Care | Payer: Medicaid Other | Attending: Emergency Medicine | Admitting: Emergency Medicine

## 2013-08-16 DIAGNOSIS — F329 Major depressive disorder, single episode, unspecified: Secondary | ICD-10-CM | POA: Insufficient documentation

## 2013-08-16 DIAGNOSIS — F3289 Other specified depressive episodes: Secondary | ICD-10-CM | POA: Insufficient documentation

## 2013-08-16 DIAGNOSIS — E119 Type 2 diabetes mellitus without complications: Secondary | ICD-10-CM | POA: Insufficient documentation

## 2013-08-16 DIAGNOSIS — G43909 Migraine, unspecified, not intractable, without status migrainosus: Secondary | ICD-10-CM | POA: Insufficient documentation

## 2013-08-16 DIAGNOSIS — I10 Essential (primary) hypertension: Secondary | ICD-10-CM | POA: Insufficient documentation

## 2013-08-16 DIAGNOSIS — Z79899 Other long term (current) drug therapy: Secondary | ICD-10-CM | POA: Insufficient documentation

## 2013-08-16 DIAGNOSIS — Z880 Allergy status to penicillin: Secondary | ICD-10-CM | POA: Insufficient documentation

## 2013-08-16 DIAGNOSIS — K089 Disorder of teeth and supporting structures, unspecified: Secondary | ICD-10-CM | POA: Insufficient documentation

## 2013-08-16 DIAGNOSIS — J45909 Unspecified asthma, uncomplicated: Secondary | ICD-10-CM | POA: Insufficient documentation

## 2013-08-16 DIAGNOSIS — E785 Hyperlipidemia, unspecified: Secondary | ICD-10-CM | POA: Insufficient documentation

## 2013-08-16 DIAGNOSIS — Z794 Long term (current) use of insulin: Secondary | ICD-10-CM | POA: Insufficient documentation

## 2013-08-16 DIAGNOSIS — Z87891 Personal history of nicotine dependence: Secondary | ICD-10-CM | POA: Insufficient documentation

## 2013-08-16 DIAGNOSIS — K0889 Other specified disorders of teeth and supporting structures: Secondary | ICD-10-CM

## 2013-08-16 DIAGNOSIS — Z8719 Personal history of other diseases of the digestive system: Secondary | ICD-10-CM | POA: Insufficient documentation

## 2013-08-16 MED ORDER — CEPHALEXIN 500 MG PO CAPS
500.0000 mg | ORAL_CAPSULE | Freq: Once | ORAL | Status: AC
  Administered 2013-08-16: 500 mg via ORAL
  Filled 2013-08-16: qty 1

## 2013-08-16 MED ORDER — CEPHALEXIN 500 MG PO CAPS: 500.0000 mg | ORAL_CAPSULE | Freq: Four times a day (QID) | ORAL | Status: DC

## 2013-08-16 NOTE — ED Provider Notes (Addendum)
CSN: [id number]     Arrival date & time 08/16/13  1820 History   First MD Initiated Contact with Patient 08/16/13 1823     Chief Complaint  Patient presents with  . Dental Problem    Patient is a 37 y.o. female presenting with tooth pain. The history is provided by the patient.  Dental Pain Location:  Upper Severity:  Moderate Onset quality:  Gradual Duration:  1 day Timing:  Constant Progression:  Worsening Chronicity:  Recurrent Relieved by:  NSAIDs Associated symptoms: no fever     Past Medical History  Diagnosis Date  . Hypertension   . Hyperlipidemia   . GERD (gastroesophageal reflux disease)   . Asthma     no inhaler  . Depression   . Diabetes mellitus     on insulin during this pregnancy  . Migraine headache    Past Surgical History  Procedure Laterality Date  . Cholecystectomy    . Wisdom tooth extraction    . Cesarean section with bilateral tubal ligation  07/18/2012    Procedure: CESAREAN SECTION WITH BILATERAL TUBAL LIGATION;  Surgeon: Mickel Baas, MD;  Location: [facility];  Service: Obstetrics;  Laterality: Bilateral;   Family History  Problem Relation Age of Onset  . Heart disease Mother   . COPD Mother   . Hypertension Mother   . Hyperlipidemia Mother   . Hearing loss Maternal Grandmother    History  Substance Use Topics  . Smoking status: Former Smoker -- 0.25 packs/day  . Smokeless tobacco: Never Used  . Alcohol Use: No   OB History   Grav Para Term Preterm Abortions TAB SAB Ect Mult Living   3 2 2  0 1 0 1 0 0 2     Review of Systems  Constitutional: Negative for fever.  Gastrointestinal: Negative for vomiting.    Allergies  Levofloxacin; Penicillins; Shellfish allergy; Sulfa antibiotics; Tequin; Imitrex; and Betadine  Home Medications   Current Outpatient Rx  Name  Route  Sig  Dispense  Refill  . buPROPion (WELLBUTRIN SR) 150 MG 12 hr tablet   Oral   Take 150 mg by mouth 2 (two) times daily.         . cetirizine (ZYRTEC)  10 MG tablet   Oral   Take 10 mg by mouth daily as needed. For allergies         . folic acid (FOLVITE) 800 MCG tablet   Oral   Take 800 mcg by mouth 2 (two) times daily.         . cephALEXin (KEFLEX) 500 MG capsule   Oral   Take 1 capsule (500 mg total) by mouth 4 (four) times daily.   40 capsule   0   . docusate sodium (COLACE) 100 MG capsule   Oral   Take 1 capsule (100 mg total) by mouth 2 (two) times daily.   60 capsule   0   . ibuprofen (ADVIL,MOTRIN) 600 MG tablet   Oral   Take 1 tablet (600 mg total) by mouth every 6 (six) hours as needed for pain.   90 tablet   0   . insulin NPH (HUMULIN N) 100 UNIT/ML injection   Subcutaneous   Inject 10 Units into the skin at bedtime.   1 vial   12   . labetalol (NORMODYNE) 100 MG tablet   Oral   Take 2 tablets (200 mg total) by mouth 2 (two) times daily.   60 tablet   1  BP 130/77  Pulse 82  Temp(Src) 97.9 F (36.6 C) (Oral)  Resp 18  Ht 5\' 9"  (1.753 m)  Wt 249 lb (112.946 kg)  BMI 36.75 kg/m2  SpO2 100% Physical Exam CONSTITUTIONAL: Well developed/well nourished HEAD AND FACE: Normocephalic/atraumatic EYES: EOMI/PERRL ENMT: Mucous membranes moist.  Poor dentition.  No trismus.  No focal abscess noted. NECK: supple no meningeal signs CV: S1/S2 noted, no murmurs/rubs/gallops noted LUNGS: Lungs are clear to auscultation bilaterally, no apparent distress ABDOMEN: soft, nontender, no rebound or guarding NEURO: Pt is awake/alert, moves all extremitiesx4 SKIN: warm, color normal  ED Course  Procedures  EKG Interpretation   None       MDM   1. Pain, dental    Nursing notes including past medical history and social history reviewed and considered in documentation     Joya Gaskins, MD 08/16/13 1831  Joya Gaskins, MD 08/19/13 [phone]

## 2013-08-16 NOTE — ED Notes (Signed)
Patient to remain in waiting room for additional 10 minutes to evaluate for any potential reaction to antibiotic and if no dsypnea or itching will then leave.

## 2013-08-16 NOTE — ED Notes (Signed)
Sent here by her MD for antibiotics - has some throbbing of right upper teeth.  States she feels like she has an infection, can take OTC for pain.  Feels like she needs dose of antibiotics now.

## 2013-12-30 ENCOUNTER — Emergency Department (HOSPITAL_COMMUNITY)
Admission: EM | Admit: 2013-12-30 | Discharge: 2013-12-30 | Disposition: A | Discharge status: Home / Self Care | Payer: Commercial Managed Care - PPO | Attending: Emergency Medicine | Admitting: Emergency Medicine

## 2013-12-30 ENCOUNTER — Encounter (HOSPITAL_COMMUNITY): Payer: Self-pay | Admitting: Emergency Medicine

## 2013-12-30 DIAGNOSIS — F329 Major depressive disorder, single episode, unspecified: Secondary | ICD-10-CM | POA: Insufficient documentation

## 2013-12-30 DIAGNOSIS — K089 Disorder of teeth and supporting structures, unspecified: Secondary | ICD-10-CM | POA: Insufficient documentation

## 2013-12-30 DIAGNOSIS — Z87891 Personal history of nicotine dependence: Secondary | ICD-10-CM | POA: Insufficient documentation

## 2013-12-30 DIAGNOSIS — F3289 Other specified depressive episodes: Secondary | ICD-10-CM | POA: Insufficient documentation

## 2013-12-30 DIAGNOSIS — I10 Essential (primary) hypertension: Secondary | ICD-10-CM | POA: Insufficient documentation

## 2013-12-30 DIAGNOSIS — Z79899 Other long term (current) drug therapy: Secondary | ICD-10-CM | POA: Insufficient documentation

## 2013-12-30 DIAGNOSIS — E119 Type 2 diabetes mellitus without complications: Secondary | ICD-10-CM | POA: Insufficient documentation

## 2013-12-30 DIAGNOSIS — J45909 Unspecified asthma, uncomplicated: Secondary | ICD-10-CM | POA: Insufficient documentation

## 2013-12-30 DIAGNOSIS — K0889 Other specified disorders of teeth and supporting structures: Secondary | ICD-10-CM

## 2013-12-30 DIAGNOSIS — Z880 Allergy status to penicillin: Secondary | ICD-10-CM | POA: Insufficient documentation

## 2013-12-30 MED ORDER — HYDROCODONE-ACETAMINOPHEN 5-325 MG PO TABS: 1.0000 | ORAL_TABLET | Freq: Four times a day (QID) | ORAL | Status: DC | PRN

## 2013-12-30 MED ORDER — CEPHALEXIN 500 MG PO CAPS: 500.0000 mg | ORAL_CAPSULE | Freq: Four times a day (QID) | ORAL | Status: DC

## 2013-12-30 NOTE — Discharge Instructions (Signed)
Dental Pain °A tooth ache may be caused by cavities (tooth decay). Cavities expose the nerve of the tooth to air and hot or cold temperatures. It may come from an infection or abscess (also called a boil or furuncle) around your tooth. It is also often caused by dental caries (tooth decay). This causes the pain you are having. °DIAGNOSIS  °Your caregiver can diagnose this problem by exam. °TREATMENT  °· If caused by an infection, it may be treated with medications which kill germs (antibiotics) and pain medications as prescribed by your caregiver. Take medications as directed. °· Only take over-the-counter or prescription medicines for pain, discomfort, or fever as directed by your caregiver. °· Whether the tooth ache today is caused by infection or dental disease, you should see your dentist as soon as possible for further care. °SEEK MEDICAL CARE IF: °The exam and treatment you received today has been provided on an emergency basis only. This is not a substitute for complete medical or dental care. If your problem worsens or new problems (symptoms) appear, and you are unable to meet with your dentist, call or return to this location. °SEEK IMMEDIATE MEDICAL CARE IF:  °· You have a fever. °· You develop redness and swelling of your face, jaw, or neck. °· You are unable to open your mouth. °· You have severe pain uncontrolled by pain medicine. °MAKE SURE YOU:  °· Understand these instructions. °· Will watch your condition. °· Will get help right away if you are not doing well or get worse. °Document Released: 09/06/2005 Document Revised: 11/29/2011 Document Reviewed: 04/24/2008 °ExitCare® Patient Information ©2014 [employer] LLC. °  Emergency Department Resource Guide °1) Find a Doctor and Pay Out of Pocket °Although you won't have to find out who is covered by your insurance plan, it is a good idea to ask around and get recommendations. You will then need to call the office and see if the doctor you have chosen will  accept you as a new patient and what types of options they offer for patients who are self-pay. Some doctors offer discounts or will set up payment plans for their patients who do not have insurance, but you will need to ask so you aren't surprised when you get to your appointment. ° °2) Contact Your Local Health Department °Not all health departments have doctors that can see patients for sick visits, but many do, so it is worth a call to see if yours does. If you don't know where your local health department is, you can check in your phone book. The CDC also has a tool to help you locate your state's health department, and many state websites also have listings of all of their local health departments. ° °3) Find a Walk-in Clinic °If your illness is not likely to be very severe or complicated, you may want to try a walk in clinic. These are popping up all over the country in pharmacies, drugstores, and shopping centers. They're usually staffed by nurse practitioners or physician assistants that have been trained to treat common illnesses and complaints. They're usually fairly quick and inexpensive. However, if you have serious medical issues or chronic medical problems, these are probably not your best option. ° °No Primary Care Doctor: °- Call [facility] at  832-8000 - they can help you locate a primary care doctor that  accepts your insurance, provides certain services, etc. °- Physician Referral Service- 1-800-533-3463 ° °Chronic Pain Problems: °Organization           Address  Phone   Notes  °[facility]  (336) 297-2271 Patients need to be referred by their primary care doctor.  ° °Medication Assistance: °Organization         Address  Phone   Notes  °[place] [facility] [address] °[place], [place] [address] (336) 641-8030 --Must be a resident of [place] °-- Must have NO insurance coverage whatsoever (no Medicaid/ Medicare, etc.) °-- The pt.  MUST have a primary care doctor that directs their care regularly and follows them in the community °  °[facility]  (866) 331-1348   °[employer]  (888) 892-1162   ° °Agencies that provide inexpensive medical care: °Organization         Address  Phone   Notes  °[facility]  (336) 832-8035   °[facility]    (336) 832-7272   °[facility] [address] °[place], [place] [address] (336) 832-4777   °[facility] [address], °[place] (336) 271-4999   °[facility]    (336) 373-0678   °[facility]    (336) 272-1050   °[facility] ° [address], [place] Phone:  (336) 832-4444, Fax:  (336) 832-4440 Hours of Operation:  9 am - 6 pm, M-F.  Also accepts Medicaid/Medicare and self-pay.  °[facility] ° [address], [place] Phone: (336) 832-3150, Fax: (336) 832-3151. Hours of Operation:  8:30 am - 5:30 pm, M-F.  Also accepts Medicaid and self-pay.  °[facility] [place] [address], [place] Phone: (336) 878-6027   °[facility] [address], [place], [place] (336)723-1848, Ext. 123 Mondays & Thursdays: 7-9 AM.  First 15 patients are seen on a first come, first serve basis. °  ° °Medicaid-accepting [place] Providers: ° °Organization         Address  Phone   Notes  °[facility] [address], [place] (336) 641-2100 Also accepts self-pay patients.  °[facility] [address], [place] ° (336) 856-9996   °[facility] [address], [place] (336) 288-8857   °[facility] [address] [place] [address], [place] (336) 299-7000   °Veita Bland [address], [place]  ° (336) 373-1557 Only accepts [place] Access Medicaid patients after they have their name applied to their card.  ° °Self-Pay (no insurance) in  [place]: ° °Organization         Address  Phone   Notes  °Sickle Cell Patients, [facility] [address], [place] (336) 832-1970   °[facility] [address], [place] (336) 832-4400   °[facility] ° [address], [place] (336) 992-4800   °[facility]/Dr. Osei-Bonsu ° [address] [place] [address], [place] or [address], [place] (336) 841-8500 Phone number for both [place] and [place] locations is the same.  °[facility] [address], [place] (336) 299-0000   °[facility] [address] [place] [address],  or [address] (336) 852-7530 °(336) 878-2260   °[facility] [address],  (336) 350-1642, phone; (336) 294-5005, fax Sees patients 1st and 3rd Saturday of every month.  Must not qualify   for public or private insurance (i.e. Medicaid, Medicare, [place] Health Choice, Veterans' Benefits) • Household income should be no more than 200% of the poverty level •The clinic cannot treat you if you are pregnant or think you are pregnant • Sexually transmitted diseases are not treated at the clinic.  ° °Dental Care: °Organization         Address  Phone  Notes  °[facility] [address], [place] (336) 641-6152 Accepts children up to age 21 who are enrolled in Medicaid or [place] Health Choice; pregnant women with a Medicaid card; and children who have applied for Medicaid or [place] Health Choice, but were declined, whose parents can pay a reduced fee at time of service.  °[facility] [place]  [address], [place] (336) 641-7733 Accepts children up to age 21 who are enrolled in Medicaid or [place] Health Choice; pregnant women with a Medicaid card; and children who have applied for Medicaid or [place] Health Choice, but were declined, whose parents  can pay a reduced fee at time of service.  °[facility] ° [address], [place] (336) 641-4533 Patients are seen by appointment only. Walk-ins are not accepted. [facility] will see patients 18 years of age and older. °Monday - Tuesday (8am-5pm) °Most Wednesdays (8:30-5pm) °$30 per visit, cash only  °[facility] ° [address], [place] (336) 641-4533 Patients are seen by appointment only. Walk-ins are not accepted. [facility] will see patients 18 years of age and older. °One Wednesday Evening (Monthly: Volunteer Based).  $30 per visit, cash only  °[facility]  (919) 537-3737 for adults; Children under age 4, call Graduate Pediatric Dentistry at (919) [phone]. Children aged 4-14, please call (919) [phone] to request a pediatric application. ° Dental services are provided in all areas of dental care including fillings, crowns and bridges, complete and partial dentures, implants, gum treatment, root canals, and extractions. Preventive care is also provided. Treatment is provided to both adults and children. °Patients are selected via a lottery and there is often a waiting list. °  °[facility] [address], °[place] ° (336) 763-8833 [email or web address] °  °[facility] [address], [place], [place] (336)723-1848, Ext. 123 Second and Fourth Thursday of each month, opens at 6:30 AM; Clinic ends at 9 AM.  Patients are seen on a first-come first-served basis, and a limited number are seen during each clinic.  ° °[facility] ° [address], [place], [place] (336) 723-7904   Eligibility Requirements °You must have lived in [place], [place], or [place] counties for at least the last three months. °  You cannot be eligible for state or federal sponsored healthcare insurance, including Veterans Administration, Medicaid, or Medicare. °  You generally cannot be eligible for healthcare  insurance through your employer.  °  How to apply: °Eligibility screenings are held every Tuesday and Wednesday afternoon from 1:00 pm until 4:00 pm. You do not need an appointment for the interview!  °[facility] [address], [place], [place] 336-631-2330   °[facility]  336-342-8273   °[facility]  336-703-3100   °[facility]  336-570-6415   ° °Behavioral Health Resources in the Community: °Intensive Outpatient Programs °Organization         Address  Phone    Notes  °[facility] [address], [place], [place] 336-878-6098   °[facility] [address], [place], [place] 336-832-9800   °[facility] [address], [place], [place] ° 336-882-2125   °[facility] [address],  °[place], [place] 1-800-853-5163 or 336-641-4981   °Substance Abuse Resources °Organization         Address  Phone  Notes  °[facility]  336-882-2125   °[facility]  336-784-9470   °[facility]  336-285-9073   °[facility]  336-845-3988   °Residential & Outpatient Substance Abuse Program  1-800-659-3381   °Psychological Services °Organization         Address  Phone  Notes  °[facility]  336- 832-9600   °[facility]  336- 378-7881   °[facility] [address], [place] 1-800-853-5163 or 336-641-4981   ° °Mobile Crisis Teams °Organization         Address  Phone  Notes  °[facility], Mobile Crisis Care Unit  1-877-626-1772   °Assertive °[facility] ° [address] [place], [place] 336-834-9664   °Sharon DeEsch [address] °[place] 336-554-5454   ° °Self-Help/Support Groups °Organization         Address  Phone             Notes  °[facility] - variety of support groups  336- 373-1402 Call for more information  °Narcotics Anonymous (NA),  [facility] [address], °[place]  2 meetings at this location  ° °Residential Treatment Programs °Organization         Address  Phone  Notes  °[facility] [address],    °[place]  1-866-801-8205   °[facility] ° [address], [place], [place] 704-293-8524   °[facility] [address], [place] 336-845-3988 Admissions: 8am-3pm M-F  °[facility] [address]    °[place], [place] 336-841-1104   °[facility] [address], [place], [place] 336-379-7146   °[facility] [address]  °[place], [place] 336-285-9073   °[facility] - Intensive Outpatient [address], [place], [place] 336-852-3033   °[facility] [address]  °[place], [place] 1-877-615-2722 or 336-784-9470   °[facility] [address] [place], [place] 336-227-7417 Accepts Medicaid  °[facility] [address]  °[place] 1-800-659-3381 Substance Abuse/Addiction Treatment  ° °[place] Behavioral Health Resources °Organization         Address  Phone  Notes  °[facility]  (888) 581-9988   °Julie Brannon, PhD [address] [place], [place]   (336) 349-5553 or (336) 951-0000   °[facility]   [address] °[place], [place] (336) 349-4454   °[facility] [address], [place], [place] (336) 342-8316 Insurance/Medicaid/sponsorship through [facility]  °[facility] [address]                                    [place], [place] (336) 342-8316 Therapy/tele-psych/case  °[facility] [address]  ° [place], [place] (336) 349-2233    °Dr. Arfeen  (336) 349-4544   °[facility] [place]  [employer] [place]   [facility] 1) [address], [place] °2) [address], [place] °3)  [address], [place] (336) 349-3220 °(336) 342-7768 ° °(336) 342-8140     °[place] [facility] (336) 342-1394 or (336) 342-3537 (After Hours)    ° °   °

## 2013-12-30 NOTE — ED Notes (Signed)
Pt c/o right side upper tooth broke off  Leaving sharp edge.

## 2013-12-30 NOTE — ED Provider Notes (Signed)
CSN: [id number]     Arrival date & time 12/30/13  2105 History  This chart was scribed for Brianna Morn, NP working with Glynn Octave, MD by Quintella Reichert, ED Scribe. This patient was seen in room TR04C/TR04C and the patient's care was started at 10:17 PM.   Chief Complaint  Patient presents with  . Dental Pain    Patient is a 38 y.o. female presenting with tooth pain. The history is provided by the patient. No language interpreter was used.  Dental Pain Toothache location: upper right. Severity:  Severe Duration:  2 days Timing:  Constant Chronicity:  New Worsened by:  Pressure Associated symptoms: facial swelling   Risk factors: lack of dental care     HPI Comments: [name] is a 38 y.o. female who presents to the Emergency Department complaining of 2 days of severe upper right-sided dental pain.  Pt states there is a "knot" in the area.  Pain is worsened by palpation of that area of her face and radiates into the right side of her face.  Pt does not have a dentist or dental insurance    Past Medical History  Diagnosis Date  . Hypertension   . Hyperlipidemia   . GERD (gastroesophageal reflux disease)   . Asthma     no inhaler  . Depression   . Diabetes mellitus     on insulin during this pregnancy  . Migraine headache     Past Surgical History  Procedure Laterality Date  . Cholecystectomy    . Wisdom tooth extraction    . Cesarean section with bilateral tubal ligation  07/18/2012    Procedure: CESAREAN SECTION WITH BILATERAL TUBAL LIGATION;  Surgeon: Mickel Baas, MD;  Location: [facility];  Service: Obstetrics;  Laterality: Bilateral;    Family History  Problem Relation Age of Onset  . Heart disease Mother   . COPD Mother   . Hypertension Mother   . Hyperlipidemia Mother   . Hearing loss Maternal Grandmother     History  Substance Use Topics  . Smoking status: Former Smoker -- 0.25 packs/day  . Smokeless tobacco: Never Used  . Alcohol Use: No     OB History   Grav Para Term Preterm Abortions TAB SAB Ect Mult Living   3 2 2  0 1 0 1 0 0 2       Review of Systems  HENT: Positive for facial swelling.   All other systems reviewed and are negative.     Allergies  Levofloxacin; Penicillins; Shellfish allergy; Sulfa antibiotics; Tequin; Imitrex; and Betadine  Home Medications   Current Outpatient Rx  Name  Route  Sig  Dispense  Refill  . cetirizine (ZYRTEC) 10 MG tablet   Oral   Take 10 mg by mouth daily as needed. For allergies         . ibuprofen (ADVIL,MOTRIN) 600 MG tablet   Oral   Take 1 tablet (600 mg total) by mouth every 6 (six) hours as needed for pain.   90 tablet   0   . metFORMIN (GLUCOPHAGE) 500 MG tablet   Oral   Take 500 mg by mouth 2 (two) times daily with a meal.         . buPROPion (WELLBUTRIN SR) 150 MG 12 hr tablet   Oral   Take 150 mg by mouth 2 (two) times daily.          BP 137/90  Pulse 92  Temp(Src) 99 F (37.2  C) (Oral)  Resp 18  Ht 5\' 9"  (1.753 m)  Wt 247 lb 8 oz (112.265 kg)  BMI 36.53 kg/m2  SpO2 99%  LMP 12/30/2013  Physical Exam  Nursing note and vitals reviewed. Constitutional: She is oriented to person, place, and time. She appears well-developed and well-nourished. No distress.  HENT:  Head: Normocephalic and atraumatic.  Mouth/Throat:    Mild swelling to right maxillary region  Eyes: EOM are normal.  Neck: Neck supple. No tracheal deviation present.  Cardiovascular: Normal rate.   Pulmonary/Chest: Effort normal. No respiratory distress.  Musculoskeletal: Normal range of motion.  Lymphadenopathy:    She has no cervical adenopathy.  Neurological: She is alert and oriented to person, place, and time.  Skin: Skin is warm and dry.  Psychiatric: She has a normal mood and affect. Her behavior is normal.    ED Course  Procedures (including critical care time)  DIAGNOSTIC STUDIES: Oxygen Saturation is 99% on room air, normal by my interpretation.     COORDINATION OF CARE: 10:23 PM-Discussed treatment plan which includes antibiotics, anti-inflammatories, pain medication, and dental referral with pt at bedside and pt agreed to plan.     Labs Review Labs Reviewed - No data to display  Imaging Review No results found.   EKG Interpretation None      MDM   Final diagnoses:  None    Dental pain.    I personally performed the services described in this documentation, which was scribed in my presence. The recorded information has been reviewed and is accurate.   Jimmye Normanavid John Freeland Pracht, NP 12/31/13 [phone]

## 2013-12-31 NOTE — ED Provider Notes (Signed)
Medical screening examination/treatment/procedure(s) were performed by non-physician practitioner and as supervising physician I was immediately available for consultation/collaboration.   EKG Interpretation None       Tsuruko Murtha, MD 12/31/13 1053 

## 2014-04-20 ENCOUNTER — Emergency Department (HOSPITAL_COMMUNITY)
Admission: EM | Admit: 2014-04-20 | Discharge: 2014-04-20 | Disposition: A | Discharge status: Home / Self Care | Payer: Commercial Managed Care - PPO | Attending: Emergency Medicine | Admitting: Emergency Medicine

## 2014-04-20 ENCOUNTER — Encounter (HOSPITAL_COMMUNITY): Payer: Self-pay | Admitting: Emergency Medicine

## 2014-04-20 DIAGNOSIS — F329 Major depressive disorder, single episode, unspecified: Secondary | ICD-10-CM | POA: Insufficient documentation

## 2014-04-20 DIAGNOSIS — Z87891 Personal history of nicotine dependence: Secondary | ICD-10-CM | POA: Insufficient documentation

## 2014-04-20 DIAGNOSIS — K089 Disorder of teeth and supporting structures, unspecified: Secondary | ICD-10-CM | POA: Insufficient documentation

## 2014-04-20 DIAGNOSIS — J45909 Unspecified asthma, uncomplicated: Secondary | ICD-10-CM | POA: Diagnosis not present

## 2014-04-20 DIAGNOSIS — E119 Type 2 diabetes mellitus without complications: Secondary | ICD-10-CM | POA: Diagnosis not present

## 2014-04-20 DIAGNOSIS — I10 Essential (primary) hypertension: Secondary | ICD-10-CM | POA: Insufficient documentation

## 2014-04-20 DIAGNOSIS — E785 Hyperlipidemia, unspecified: Secondary | ICD-10-CM | POA: Insufficient documentation

## 2014-04-20 DIAGNOSIS — K047 Periapical abscess without sinus: Secondary | ICD-10-CM

## 2014-04-20 DIAGNOSIS — K219 Gastro-esophageal reflux disease without esophagitis: Secondary | ICD-10-CM | POA: Insufficient documentation

## 2014-04-20 DIAGNOSIS — Z880 Allergy status to penicillin: Secondary | ICD-10-CM | POA: Diagnosis not present

## 2014-04-20 DIAGNOSIS — Z79899 Other long term (current) drug therapy: Secondary | ICD-10-CM | POA: Insufficient documentation

## 2014-04-20 DIAGNOSIS — G43909 Migraine, unspecified, not intractable, without status migrainosus: Secondary | ICD-10-CM | POA: Insufficient documentation

## 2014-04-20 DIAGNOSIS — F3289 Other specified depressive episodes: Secondary | ICD-10-CM | POA: Diagnosis not present

## 2014-04-20 MED ORDER — HYDROCODONE-ACETAMINOPHEN 5-325 MG PO TABS: 1.0000 | ORAL_TABLET | ORAL | Status: DC | PRN

## 2014-04-20 NOTE — ED Notes (Signed)
Pt c/o left upper dental pain. Pt seen by pcp on Thursday and prescribed antibiotic and oxycodone. Pt states oxycodone was too strong and she flushed them. Pt here for pain control.

## 2014-04-20 NOTE — Discharge Instructions (Signed)
Dental Abscess A dental abscess is a collection of infected fluid (pus) from a bacterial infection in the inner part of the tooth (pulp). It usually occurs at the end of the tooth's root.  CAUSES   Severe tooth decay.  Trauma to the tooth that allows bacteria to enter into the pulp, such as a broken or chipped tooth. SYMPTOMS   Severe pain in and around the infected tooth.  Swelling and redness around the abscessed tooth or in the mouth or face.  Tenderness.  Pus drainage.  Bad breath.  Bitter taste in the mouth.  Difficulty swallowing.  Difficulty opening the mouth.  Nausea.  Vomiting.  Chills.  Swollen neck glands. DIAGNOSIS   A medical and dental history will be taken.  An examination will be performed by tapping on the abscessed tooth.  X-rays may be taken of the tooth to identify the abscess. TREATMENT The goal of treatment is to eliminate the infection. You may be prescribed antibiotic medicine to stop the infection from spreading. A root canal may be performed to save the tooth. If the tooth cannot be saved, it may be pulled (extracted) and the abscess may be drained.  HOME CARE INSTRUCTIONS  Only take over-the-counter or prescription medicines for pain, fever, or discomfort as directed by your caregiver.  Rinse your mouth (gargle) often with salt water ( tsp salt in 8 oz [250 ml] of warm water) to relieve pain or swelling.  Do not drive after taking pain medicine (narcotics).  Do not apply heat to the outside of your face.  Return to your dentist for further treatment as directed. SEEK MEDICAL CARE IF:  Your pain is not helped by medicine.  Your pain is getting worse instead of better. SEEK IMMEDIATE MEDICAL CARE IF:  You have a fever or persistent symptoms for more than 2-3 days.  You have a fever and your symptoms suddenly get worse.  You have chills or a very bad headache.  You have problems breathing or swallowing.  You have trouble  opening your mouth.  You have swelling in the neck or around the eye. Document Released: 09/06/2005 Document Revised: 05/31/2012 Document Reviewed: 12/15/2010 [facility] Patient Information 2015 [employer] [employer], [place]. This information is not intended to replace advice given to you by your health care provider. Make sure you discuss any questions you have with your health care provider.   Make sure you complete the antibiotic prescribed by Dr Cyndia BentBadger.  You may take the hydrocodone prescribed for pain relief.  This will make you drowsy - do not drive within 4 hours of taking this medication.

## 2014-04-20 NOTE — ED Provider Notes (Signed)
CSN: [id number]     Arrival date & time 04/20/14  1724 History   First MD Initiated Contact with Patient 04/20/14 1740     Chief Complaint  Patient presents with  . Dental Pain     (Consider location/radiation/quality/duration/timing/severity/associated sxs/prior Treatment) The history is provided by the patient.    [name] is a 38 y.o. female presenting with a 7 day history of dental pain and gingival swelling.   She was seen by her pcp 2 days ago and placed on clindamycin and Percocet.  She took a Percocet tablet which made her way to drowsy and therefore flushed this medication.  She states she was unable to keep her eyes open for hours after taking percocet.  She does continue to take her clindamycin. There has been no fevers, chills, nausea or vomiting, also no complaint of difficulty swallowing, although chewing makes pain worse.    Past Medical History  Diagnosis Date  . Hypertension   . Hyperlipidemia   . GERD (gastroesophageal reflux disease)   . Asthma     no inhaler  . Depression   . Diabetes mellitus     on insulin during this pregnancy  . Migraine headache    Past Surgical History  Procedure Laterality Date  . Cholecystectomy    . Wisdom tooth extraction    . Cesarean section with bilateral tubal ligation  07/18/2012    Procedure: CESAREAN SECTION WITH BILATERAL TUBAL LIGATION;  Surgeon: Mickel Baas, MD;  Location: [facility];  Service: Obstetrics;  Laterality: Bilateral;   Family History  Problem Relation Age of Onset  . Heart disease Mother   . COPD Mother   . Hypertension Mother   . Hyperlipidemia Mother   . Hearing loss Maternal Grandmother    History  Substance Use Topics  . Smoking status: Former Smoker -- 0.25 packs/day  . Smokeless tobacco: Never Used  . Alcohol Use: No   OB History   Grav Para Term Preterm Abortions TAB SAB Ect Mult Living   3 2 2  0 1 0 1 0 0 2     Review of Systems  Constitutional: Negative for fever.  HENT:  Positive for dental problem. Negative for facial swelling and sore throat.   Respiratory: Negative for shortness of breath.   Musculoskeletal: Negative for neck pain and neck stiffness.      Allergies  Levofloxacin; Penicillins; Shellfish allergy; Sulfa antibiotics; Tequin; Imitrex; and Betadine  Home Medications   Prior to Admission medications   Medication Sig Start Date End Date Taking? Authorizing Provider  Aspirin-Acetaminophen-Caffeine (GOODYS EXTRA STRENGTH) 260-130-16 MG TABS Take 1 packet by mouth every 6 (six) hours as needed. pain   Yes Historical Provider, MD  buPROPion (WELLBUTRIN SR) 150 MG 12 hr tablet Take 150 mg by mouth 2 (two) times daily.   Yes Historical Provider, MD  cetirizine (ZYRTEC) 10 MG tablet Take 10 mg by mouth daily as needed. For allergies   Yes Historical Provider, MD  DULoxetine (CYMBALTA) 60 MG capsule Take 60 mg by mouth daily.   Yes Historical Provider, MD  ibuprofen (ADVIL,MOTRIN) 200 MG tablet Take 800 mg by mouth every 8 (eight) hours as needed. pain   Yes Historical Provider, MD  lisinopril (PRINIVIL,ZESTRIL) 20 MG tablet Take 20 mg by mouth daily.   Yes Historical Provider, MD  metFORMIN (GLUCOPHAGE) 500 MG tablet Take 500 mg by mouth 2 (two) times daily with a meal.   Yes Historical Provider, MD  omeprazole (PRILOSEC) 40  MG capsule Take 40 mg by mouth 2 (two) times daily.   Yes Historical Provider, MD  topiramate (TOPAMAX) 200 MG tablet Take 200 mg by mouth 2 (two) times daily.   Yes Historical Provider, MD  HYDROcodone-acetaminophen (NORCO/VICODIN) 5-325 MG per tablet Take 1 tablet by mouth every 4 (four) hours as needed. 04/20/14   Burgess AmorJulie Coutney Wildermuth, PA-C   BP 140/82  Pulse 104  Temp(Src) 97.7 F (36.5 C) (Oral)  Resp 18  Ht 5\' 9"  (1.753 m)  Wt 224 lb (101.606 kg)  BMI 33.06 kg/m2  SpO2 100%  LMP 04/11/2014 Physical Exam  Constitutional: She is oriented to person, place, and time. She appears well-developed and well-nourished. No distress.    HENT:  Head: Normocephalic and atraumatic.  Right Ear: Tympanic membrane and external ear normal.  Left Ear: Tympanic membrane and external ear normal.  Mouth/Throat: Oropharynx is clear and moist and mucous membranes are normal. No oral lesions. Dental abscesses present.    Eyes: Conjunctivae are normal.  Neck: Normal range of motion. Neck supple.  Cardiovascular: Normal rate and normal heart sounds.   Pulmonary/Chest: Effort normal.  Abdominal: She exhibits no distension.  Musculoskeletal: Normal range of motion.  Lymphadenopathy:    She has no cervical adenopathy.  Neurological: She is alert and oriented to person, place, and time.  Skin: Skin is warm and dry. No erythema.  Psychiatric: She has a normal mood and affect.    ED Course  Procedures (including critical care time) Labs Review Labs Reviewed - No data to display  Imaging Review No results found.   EKG Interpretation None      MDM   Final diagnoses:  Dental abscess    [place] controlled substance database reviewed. Pt with dental abscess not tolerating percocet.  Switched to hydrocodone.  She is not a frequenter of the ed,  Narcotic database checks out.  She was given dental referral numbers.    Burgess AmorJulie Channie Bostick, PA-C 04/20/14 1844

## 2014-04-22 NOTE — ED Provider Notes (Signed)
History/physical exam/procedure(s) were performed by non-physician practitioner and as supervising physician I was immediately available for consultation/collaboration. I have reviewed all notes and am in agreement with care and plan.   Hilario Quarryanielle S Avyn Aden, MD 04/22/14 1228

## 2014-05-07 ENCOUNTER — Emergency Department (HOSPITAL_COMMUNITY): Payer: 59

## 2014-05-07 ENCOUNTER — Encounter (HOSPITAL_COMMUNITY): Payer: Self-pay | Admitting: Emergency Medicine

## 2014-05-07 ENCOUNTER — Emergency Department (HOSPITAL_COMMUNITY)
Admission: EM | Admit: 2014-05-07 | Discharge: 2014-05-07 | Disposition: A | Discharge status: Home / Self Care | Payer: 59 | Attending: Emergency Medicine | Admitting: Emergency Medicine

## 2014-05-07 DIAGNOSIS — Z3202 Encounter for pregnancy test, result negative: Secondary | ICD-10-CM | POA: Insufficient documentation

## 2014-05-07 DIAGNOSIS — F329 Major depressive disorder, single episode, unspecified: Secondary | ICD-10-CM | POA: Diagnosis not present

## 2014-05-07 DIAGNOSIS — Z79899 Other long term (current) drug therapy: Secondary | ICD-10-CM | POA: Diagnosis not present

## 2014-05-07 DIAGNOSIS — F3289 Other specified depressive episodes: Secondary | ICD-10-CM | POA: Diagnosis not present

## 2014-05-07 DIAGNOSIS — G43909 Migraine, unspecified, not intractable, without status migrainosus: Secondary | ICD-10-CM | POA: Insufficient documentation

## 2014-05-07 DIAGNOSIS — Z9089 Acquired absence of other organs: Secondary | ICD-10-CM | POA: Insufficient documentation

## 2014-05-07 DIAGNOSIS — F17200 Nicotine dependence, unspecified, uncomplicated: Secondary | ICD-10-CM | POA: Diagnosis not present

## 2014-05-07 DIAGNOSIS — Z880 Allergy status to penicillin: Secondary | ICD-10-CM | POA: Diagnosis not present

## 2014-05-07 DIAGNOSIS — I10 Essential (primary) hypertension: Secondary | ICD-10-CM | POA: Diagnosis not present

## 2014-05-07 DIAGNOSIS — E119 Type 2 diabetes mellitus without complications: Secondary | ICD-10-CM | POA: Diagnosis not present

## 2014-05-07 DIAGNOSIS — J45909 Unspecified asthma, uncomplicated: Secondary | ICD-10-CM | POA: Diagnosis not present

## 2014-05-07 DIAGNOSIS — R1031 Right lower quadrant pain: Secondary | ICD-10-CM | POA: Insufficient documentation

## 2014-05-07 DIAGNOSIS — K219 Gastro-esophageal reflux disease without esophagitis: Secondary | ICD-10-CM | POA: Insufficient documentation

## 2014-05-07 DIAGNOSIS — Z9889 Other specified postprocedural states: Secondary | ICD-10-CM | POA: Insufficient documentation

## 2014-05-07 LAB — BASIC METABOLIC PANEL
Anion gap: 13 (ref 5–15)
BUN: 20 mg/dL (ref 6–23)
CO2: 21 mEq/L (ref 19–32)
Calcium: 9.4 mg/dL (ref 8.4–10.5)
Chloride: 105 mEq/L (ref 96–112)
Creatinine, Ser: 0.91 mg/dL (ref 0.50–1.10)
GFR, EST NON AFRICAN AMERICAN: 78 mL/min — AB (ref >90)
GLUCOSE: 110 mg/dL — AB (ref 70–99)
POTASSIUM: 3.7 meq/L (ref 3.7–5.3)
Sodium: 139 mEq/L (ref 137–147)

## 2014-05-07 LAB — HEPATIC FUNCTION PANEL
ALK PHOS: 74 U/L (ref 39–117)
ALT: 14 U/L (ref 0–35)
AST: 13 U/L (ref 0–37)
Albumin: 3.7 g/dL (ref 3.5–5.2)
Bilirubin, Direct: <0.2 mg/dL (ref 0.0–0.3)
TOTAL PROTEIN: 7.2 g/dL (ref 6.0–8.3)
Total Bilirubin: 0.2 mg/dL — ABNORMAL LOW (ref 0.3–1.2)

## 2014-05-07 LAB — URINALYSIS, ROUTINE W REFLEX MICROSCOPIC
BILIRUBIN URINE: NEGATIVE
Glucose, UA: NEGATIVE mg/dL
Hgb urine dipstick: NEGATIVE
Ketones, ur: NEGATIVE mg/dL
Leukocytes, UA: NEGATIVE
Nitrite: NEGATIVE
Protein, ur: NEGATIVE mg/dL
Specific Gravity, Urine: 1.02 (ref 1.005–1.030)
UROBILINOGEN UA: 0.2 mg/dL (ref 0.0–1.0)
pH: 6 (ref 5.0–8.0)

## 2014-05-07 LAB — CBC WITH DIFFERENTIAL/PLATELET
BASOS ABS: 0 10*3/uL (ref 0.0–0.1)
Basophils Relative: 0 % (ref 0–1)
EOS ABS: 0.3 10*3/uL (ref 0.0–0.7)
Eosinophils Relative: 4 % (ref 0–5)
HCT: 36.3 % (ref 36.0–46.0)
Hemoglobin: 11.5 g/dL — ABNORMAL LOW (ref 12.0–15.0)
LYMPHS ABS: 3.7 10*3/uL (ref 0.7–4.0)
Lymphocytes Relative: 39 % (ref 12–46)
MCH: 27.6 pg (ref 26.0–34.0)
MCHC: 31.7 g/dL (ref 30.0–36.0)
MCV: 87.3 fL (ref 78.0–100.0)
Monocytes Absolute: 0.7 10*3/uL (ref 0.1–1.0)
Monocytes Relative: 7 % (ref 3–12)
NEUTROS PCT: 50 % (ref 43–77)
Neutro Abs: 4.8 10*3/uL (ref 1.7–7.7)
PLATELETS: 285 10*3/uL (ref 150–400)
RBC: 4.16 MIL/uL (ref 3.87–5.11)
RDW: 14.9 % (ref 11.5–15.5)
WBC: 9.6 10*3/uL (ref 4.0–10.5)

## 2014-05-07 LAB — PREGNANCY, URINE: PREG TEST UR: NEGATIVE

## 2014-05-07 MED ORDER — LORAZEPAM 2 MG/ML IJ SOLN
1.0000 mg | Freq: Once | INTRAMUSCULAR | Status: AC
  Administered 2014-05-07: 1 mg via INTRAVENOUS
  Filled 2014-05-07: qty 1

## 2014-05-07 MED ORDER — ONDANSETRON HCL 4 MG/2ML IJ SOLN
4.0000 mg | Freq: Once | INTRAMUSCULAR | Status: AC
  Administered 2014-05-07: 4 mg via INTRAVENOUS
  Filled 2014-05-07: qty 2

## 2014-05-07 MED ORDER — HYDROMORPHONE HCL PF 1 MG/ML IJ SOLN
1.0000 mg | Freq: Once | INTRAMUSCULAR | Status: AC
  Administered 2014-05-07: 1 mg via INTRAVENOUS
  Filled 2014-05-07: qty 1

## 2014-05-07 MED ORDER — OXYCODONE-ACETAMINOPHEN 5-325 MG PO TABS: 1.0000 | ORAL_TABLET | Freq: Four times a day (QID) | ORAL | Status: DC | PRN

## 2014-05-07 MED ORDER — KETOROLAC TROMETHAMINE 30 MG/ML IJ SOLN
30.0000 mg | Freq: Once | INTRAMUSCULAR | Status: AC
Start: 2014-05-07 — End: 2014-05-07
  Administered 2014-05-07: 30 mg via INTRAVENOUS
  Filled 2014-05-07: qty 1

## 2014-05-07 MED ORDER — MORPHINE SULFATE 4 MG/ML IJ SOLN
8.0000 mg | Freq: Once | INTRAMUSCULAR | Status: AC
  Administered 2014-05-07: 8 mg via INTRAVENOUS
  Filled 2014-05-07: qty 2

## 2014-05-07 NOTE — ED Notes (Signed)
Spent a lot of time with this pt since arrival. Very needy, calling staff into room frequently to ask same questions "what's wrong with me? What are we doing now, I need a blanket, I need to talk to you" I have sat with her and reassured her multiple time. Anxiety level is increasing.

## 2014-05-07 NOTE — ED Notes (Signed)
Asking for "another shot IV to go" I again told her how much medication she just received and that there would be no futher meds given

## 2014-05-07 NOTE — ED Notes (Signed)
Patient complaining of severe right lower quadrant abdominal pain that started approximately 4 hours ago. Also reports a couple episodes of vomiting and diarrhea.

## 2014-05-07 NOTE — ED Notes (Signed)
Pt on phone arguing with her husband, tells him she has a "very serious condition with my female parts that cannot be ignored and I must see my gynecologist immediately"when she got off the phone I asked her about that comment and tried to clarify that she needs f/u but we don't feel that she is in any eminent danger. I repeatedly answered her questions about her CT report. I offered to talk to her husband when he came to get her if she wanted me to. She said it was OK with her but he didn't want to talk to me. I encouraged her to stay in the room on stretcher until he got here but she wants to go outside to smoke. I explained that it wasn't a good idea since she was still dozing off mid sentence following her medication and that it was raining out. She eventually allowed me to take her to waiting room via w/c. intermittently hugging me and crying. Did not want me to stay with her until husband arrived. States she will call her gyn today for f/u

## 2014-05-07 NOTE — Discharge Instructions (Signed)
Follow up with your gyn md this week. °

## 2014-05-07 NOTE — ED Notes (Signed)
increasingly anxious. "i'm afraid of what this pain could be" crying, c/o nausea although no vomiting.

## 2014-05-07 NOTE — ED Notes (Signed)
Very drowsy after ativan but keeps jerking awake, tearful repeating that she's "so scared" I asked her what is the one thing she's afraid of and she started sobbing and said her 4975yr alcoholic mom "dropped dead" last October. Relates much unresolved grieving. States "I'm seeing a psychiatrist" but when asked when her next appt is, she admits that it was only twice and she has not f/u appt.admits to much self described "hypochondria" does not feel she needs to talk to anyone from psych at this time

## 2014-05-07 NOTE — ED Provider Notes (Signed)
CSN: [id number]     Arrival date & time 05/07/14  0018 History   First MD Initiated Contact with Patient 05/07/14 0208     Chief Complaint  Patient presents with  . Abdominal Pain     (Consider location/radiation/quality/duration/timing/severity/associated sxs/prior Treatment) Patient is a 38 y.o. female presenting with abdominal pain. The history is provided by the patient (the pt complains of severe rlq abd pain).  Abdominal Pain Pain location:  RLQ Pain quality: aching   Pain radiates to:  Does not radiate Pain severity:  Severe Onset quality:  Sudden Timing:  Constant Progression:  Worsening Chronicity:  New Context: not alcohol use   Associated symptoms: no chest pain, no cough, no diarrhea, no fatigue and no hematuria     Past Medical History  Diagnosis Date  . Hypertension   . Hyperlipidemia   . GERD (gastroesophageal reflux disease)   . Asthma     no inhaler  . Depression   . Diabetes mellitus     on insulin during this pregnancy  . Migraine headache    Past Surgical History  Procedure Laterality Date  . Cholecystectomy    . Wisdom tooth extraction    . Cesarean section with bilateral tubal ligation  07/18/2012    Procedure: CESAREAN SECTION WITH BILATERAL TUBAL LIGATION;  Surgeon: Mickel Baas, MD;  Location: [facility];  Service: Obstetrics;  Laterality: Bilateral;   Family History  Problem Relation Age of Onset  . Heart disease Mother   . COPD Mother   . Hypertension Mother   . Hyperlipidemia Mother   . Hearing loss Maternal Grandmother    History  Substance Use Topics  . Smoking status: Current Every Day Smoker -- 0.25 packs/day  . Smokeless tobacco: Never Used  . Alcohol Use: No   OB History   Grav Para Term Preterm Abortions TAB SAB Ect Mult Living   3 2 2  0 1 0 1 0 0 2     Review of Systems  Constitutional: Negative for appetite change and fatigue.  HENT: Negative for congestion, ear discharge and sinus pressure.   Eyes: Negative for  discharge.  Respiratory: Negative for cough.   Cardiovascular: Negative for chest pain.  Gastrointestinal: Positive for abdominal pain. Negative for diarrhea.  Genitourinary: Negative for frequency and hematuria.  Musculoskeletal: Negative for back pain.  Skin: Negative for rash.  Neurological: Negative for seizures and headaches.  Psychiatric/Behavioral: Negative for hallucinations.      Allergies  Levofloxacin; Penicillins; Shellfish allergy; Sulfa antibiotics; Tequin; Imitrex; and Betadine  Home Medications   Prior to Admission medications   Medication Sig Start Date End Date Taking? Authorizing Provider  Aspirin-Acetaminophen-Caffeine (GOODYS EXTRA STRENGTH) 260-130-16 MG TABS Take 1 packet by mouth every 6 (six) hours as needed. pain    Historical Provider, MD  buPROPion (WELLBUTRIN SR) 150 MG 12 hr tablet Take 150 mg by mouth 2 (two) times daily.    Historical Provider, MD  cetirizine (ZYRTEC) 10 MG tablet Take 10 mg by mouth daily as needed. For allergies    Historical Provider, MD  DULoxetine (CYMBALTA) 60 MG capsule Take 60 mg by mouth daily.    Historical Provider, MD  HYDROcodone-acetaminophen (NORCO/VICODIN) 5-325 MG per tablet Take 1 tablet by mouth every 4 (four) hours as needed. 04/20/14   Burgess Amor, PA-C  ibuprofen (ADVIL,MOTRIN) 200 MG tablet Take 800 mg by mouth every 8 (eight) hours as needed. pain    Historical Provider, MD  lisinopril (PRINIVIL,ZESTRIL) 20 MG tablet Take  20 mg by mouth daily.    Historical Provider, MD  metFORMIN (GLUCOPHAGE) 500 MG tablet Take 500 mg by mouth 2 (two) times daily with a meal.    Historical Provider, MD  omeprazole (PRILOSEC) 40 MG capsule Take 40 mg by mouth 2 (two) times daily.    Historical Provider, MD  oxyCODONE-acetaminophen (PERCOCET/ROXICET) 5-325 MG per tablet Take 1 tablet by mouth every 6 (six) hours as needed. 05/07/14   Benny LennertJoseph L Aseret Hoffman, MD  topiramate (TOPAMAX) 200 MG tablet Take 200 mg by mouth 2 (two) times daily.     Historical Provider, MD   BP 121/84  Pulse 64  Temp(Src) 97.6 F (36.4 C) (Oral)  Resp 16  Ht 5\' 9"  (1.753 m)  Wt 234 lb (106.142 kg)  BMI 34.54 kg/m2  SpO2 98%  LMP 04/25/2014 Physical Exam  Constitutional: She is oriented to person, place, and time. She appears well-developed.  HENT:  Head: Normocephalic.  Eyes: Conjunctivae and EOM are normal. No scleral icterus.  Neck: Neck supple. No thyromegaly present.  Cardiovascular: Normal rate and regular rhythm.  Exam reveals no gallop and no friction rub.   No murmur heard. Pulmonary/Chest: No stridor. She has no wheezes. She has no rales. She exhibits no tenderness.  Abdominal: She exhibits no distension. There is tenderness. There is no rebound.  Moderate tender rlq  Musculoskeletal: Normal range of motion. She exhibits no edema.  Lymphadenopathy:    She has no cervical adenopathy.  Neurological: She is oriented to person, place, and time. She exhibits normal muscle tone. Coordination normal.  Skin: No rash noted. No erythema.  Psychiatric: She has a normal mood and affect. Her behavior is normal.    ED Course  Procedures (including critical care time) Labs Review Labs Reviewed  CBC WITH DIFFERENTIAL - Abnormal; Notable for the following:    Hemoglobin 11.5 (*)    All other components within normal limits  BASIC METABOLIC PANEL - Abnormal; Notable for the following:    Glucose, Bld 110 (*)    GFR calc non Af Amer 78 (*)    All other components within normal limits  HEPATIC FUNCTION PANEL - Abnormal; Notable for the following:    Total Bilirubin 0.2 (*)    All other components within normal limits  PREGNANCY, URINE  URINALYSIS, ROUTINE W REFLEX MICROSCOPIC    Imaging Review Ct Abdomen Pelvis Wo Contrast  05/07/2014   CLINICAL DATA:  Severe right lower quadrant abdominal pain.  EXAM: CT ABDOMEN AND PELVIS WITHOUT CONTRAST  TECHNIQUE: Multidetector CT imaging of the abdomen and pelvis was performed following the standard  protocol without IV contrast.  COMPARISON:  11/08/2011  FINDINGS: BODY WALL: Unremarkable.  LOWER CHEST: Unremarkable.  ABDOMEN/PELVIS:  Liver: No focal abnormality.  Biliary: Cholecystectomy.  No biliary dilatation.  Pancreas: Unremarkable.  Spleen: Unremarkable.  Adrenals: Unremarkable.  Kidneys and ureters: No hydronephrosis or stone.  Bladder: Unremarkable.  Reproductive: Bilateral tubal ligation.  No indication of pathology.  Bowel: No obstruction. Normal appendix.  Retroperitoneum: No mass or adenopathy.  Peritoneum: No ascites or pneumoperitoneum.  Vascular: No acute abnormality.  OSSEOUS: No acute abnormalities.  IMPRESSION: No acute intra-abdominal disease.   Electronically Signed   By: Tiburcio PeaJonathan  Watts M.D.   On: 05/07/2014 04:18     EKG Interpretation None      MDM   Final diagnoses:  Right lower quadrant abdominal pain    abd pain with nl studies will tx pain and refer to her gyn md  Benny Lennert, MD 05/07/14 [phone]

## 2014-05-07 NOTE — ED Notes (Signed)
Extremely agitated, angry. "I've told people over and over again how much excruciating pain I'm in and no one is doing anything"

## 2014-05-07 NOTE — ED Notes (Signed)
Now c/o nausea.

## 2014-07-22 ENCOUNTER — Encounter (HOSPITAL_COMMUNITY): Payer: Self-pay | Admitting: Emergency Medicine

## 2014-10-01 ENCOUNTER — Encounter (HOSPITAL_COMMUNITY): Payer: Self-pay | Admitting: *Deleted

## 2014-10-01 ENCOUNTER — Emergency Department (HOSPITAL_COMMUNITY)
Admission: EM | Admit: 2014-10-01 | Discharge: 2014-10-01 | Disposition: A | Discharge status: Home / Self Care | Payer: 59 | Attending: Emergency Medicine | Admitting: Emergency Medicine

## 2014-10-01 DIAGNOSIS — I10 Essential (primary) hypertension: Secondary | ICD-10-CM | POA: Insufficient documentation

## 2014-10-01 DIAGNOSIS — E119 Type 2 diabetes mellitus without complications: Secondary | ICD-10-CM | POA: Insufficient documentation

## 2014-10-01 DIAGNOSIS — Z880 Allergy status to penicillin: Secondary | ICD-10-CM | POA: Insufficient documentation

## 2014-10-01 DIAGNOSIS — K219 Gastro-esophageal reflux disease without esophagitis: Secondary | ICD-10-CM | POA: Insufficient documentation

## 2014-10-01 DIAGNOSIS — R1013 Epigastric pain: Secondary | ICD-10-CM

## 2014-10-01 DIAGNOSIS — J45909 Unspecified asthma, uncomplicated: Secondary | ICD-10-CM | POA: Insufficient documentation

## 2014-10-01 DIAGNOSIS — N390 Urinary tract infection, site not specified: Secondary | ICD-10-CM | POA: Insufficient documentation

## 2014-10-01 DIAGNOSIS — F329 Major depressive disorder, single episode, unspecified: Secondary | ICD-10-CM | POA: Insufficient documentation

## 2014-10-01 DIAGNOSIS — R112 Nausea with vomiting, unspecified: Secondary | ICD-10-CM

## 2014-10-01 DIAGNOSIS — Z720 Tobacco use: Secondary | ICD-10-CM | POA: Insufficient documentation

## 2014-10-01 DIAGNOSIS — G43909 Migraine, unspecified, not intractable, without status migrainosus: Secondary | ICD-10-CM | POA: Insufficient documentation

## 2014-10-01 DIAGNOSIS — Z79899 Other long term (current) drug therapy: Secondary | ICD-10-CM | POA: Insufficient documentation

## 2014-10-01 LAB — CBC WITH DIFFERENTIAL/PLATELET
Basophils Absolute: 0 10*3/uL (ref 0.0–0.1)
Basophils Relative: 0 % (ref 0–1)
EOS PCT: 2 % (ref 0–5)
Eosinophils Absolute: 0.2 10*3/uL (ref 0.0–0.7)
HEMATOCRIT: 42.8 % (ref 36.0–46.0)
Hemoglobin: 13.9 g/dL (ref 12.0–15.0)
LYMPHS PCT: 22 % (ref 12–46)
Lymphs Abs: 2.2 10*3/uL (ref 0.7–4.0)
MCH: 28.2 pg (ref 26.0–34.0)
MCHC: 32.5 g/dL (ref 30.0–36.0)
MCV: 86.8 fL (ref 78.0–100.0)
MONO ABS: 0.5 10*3/uL (ref 0.1–1.0)
Monocytes Relative: 5 % (ref 3–12)
Neutro Abs: 7.3 10*3/uL (ref 1.7–7.7)
Neutrophils Relative %: 71 % (ref 43–77)
Platelets: 309 10*3/uL (ref 150–400)
RBC: 4.93 MIL/uL (ref 3.87–5.11)
RDW: 14.8 % (ref 11.5–15.5)
WBC: 10.2 10*3/uL (ref 4.0–10.5)

## 2014-10-01 LAB — URINE MICROSCOPIC-ADD ON

## 2014-10-01 LAB — URINALYSIS, ROUTINE W REFLEX MICROSCOPIC
Bilirubin Urine: NEGATIVE
Glucose, UA: NEGATIVE mg/dL
Hgb urine dipstick: NEGATIVE
Ketones, ur: NEGATIVE mg/dL
LEUKOCYTES UA: NEGATIVE
NITRITE: NEGATIVE
PH: 7 (ref 5.0–8.0)
Protein, ur: NEGATIVE mg/dL
Specific Gravity, Urine: 1.024 (ref 1.005–1.030)
UROBILINOGEN UA: 0.2 mg/dL (ref 0.0–1.0)

## 2014-10-01 LAB — COMPREHENSIVE METABOLIC PANEL
ALT: 20 U/L (ref 0–35)
AST: 16 U/L (ref 0–37)
Albumin: 4.1 g/dL (ref 3.5–5.2)
Alkaline Phosphatase: 75 U/L (ref 39–117)
Anion gap: 7 (ref 5–15)
BUN: 13 mg/dL (ref 6–23)
CHLORIDE: 108 meq/L (ref 96–112)
CO2: 22 mmol/L (ref 19–32)
Calcium: 9.2 mg/dL (ref 8.4–10.5)
Creatinine, Ser: 0.87 mg/dL (ref 0.50–1.10)
GFR calc Af Amer: >90 mL/min (ref >90)
GFR, EST NON AFRICAN AMERICAN: 82 mL/min — AB (ref >90)
Glucose, Bld: 156 mg/dL — ABNORMAL HIGH (ref 70–99)
Potassium: 4.2 mmol/L (ref 3.5–5.1)
SODIUM: 137 mmol/L (ref 135–145)
Total Bilirubin: 0.5 mg/dL (ref 0.3–1.2)
Total Protein: 7.7 g/dL (ref 6.0–8.3)

## 2014-10-01 LAB — LIPASE, BLOOD: LIPASE: 44 U/L (ref 11–59)

## 2014-10-01 MED ORDER — ONDANSETRON HCL 4 MG/2ML IJ SOLN
4.0000 mg | Freq: Once | INTRAMUSCULAR | Status: AC
  Administered 2014-10-01: 4 mg via INTRAVENOUS
  Filled 2014-10-01: qty 2

## 2014-10-01 MED ORDER — PROMETHAZINE HCL 25 MG RE SUPP: 25.0000 mg | Freq: Four times a day (QID) | RECTAL | Status: DC | PRN

## 2014-10-01 MED ORDER — KETOROLAC TROMETHAMINE 30 MG/ML IJ SOLN
30.0000 mg | Freq: Once | INTRAMUSCULAR | Status: AC
  Administered 2014-10-01: 30 mg via INTRAVENOUS
  Filled 2014-10-01: qty 1

## 2014-10-01 MED ORDER — MORPHINE SULFATE 4 MG/ML IJ SOLN
4.0000 mg | Freq: Once | INTRAMUSCULAR | Status: AC
  Administered 2014-10-01: 4 mg via INTRAVENOUS
  Filled 2014-10-01: qty 1

## 2014-10-01 MED ORDER — PROMETHAZINE HCL 25 MG/ML IJ SOLN
12.5000 mg | Freq: Once | INTRAMUSCULAR | Status: AC
  Administered 2014-10-01: 12.5 mg via INTRAVENOUS
  Filled 2014-10-01: qty 1

## 2014-10-01 MED ORDER — SODIUM CHLORIDE 0.9 % IV BOLUS (SEPSIS)
2000.0000 mL | Freq: Once | INTRAVENOUS | Status: AC
  Administered 2014-10-01: 2000 mL via INTRAVENOUS

## 2014-10-01 MED ORDER — NITROFURANTOIN MONOHYD MACRO 100 MG PO CAPS: 100.0000 mg | ORAL_CAPSULE | Freq: Two times a day (BID) | ORAL | Status: DC

## 2014-10-01 NOTE — ED Notes (Signed)
Pt. Started vomiting at 11pm last night and has thrown up about 7 times. Pt. Is experiencing back pain between the shoulder blades. 5/10 cramping pain. Pt. Is unable to keep fluids down

## 2014-10-01 NOTE — Discharge Instructions (Signed)
Nausea and Vomiting °Nausea is a sick feeling that often comes before throwing up (vomiting). Vomiting is a reflex where stomach contents come out of your mouth. Vomiting can cause severe loss of body fluids (dehydration). Children and elderly adults can become dehydrated quickly, especially if they also have diarrhea. Nausea and vomiting are symptoms of a condition or disease. It is important to find the cause of your symptoms. °CAUSES  °· Direct irritation of the stomach lining. This irritation can result from increased acid production (gastroesophageal reflux disease), infection, food poisoning, taking certain medicines (such as nonsteroidal anti-inflammatory drugs), alcohol use, or tobacco use. °· Signals from the brain. These signals could be caused by a headache, heat exposure, an inner ear disturbance, increased pressure in the brain from injury, infection, a tumor, or a concussion, pain, emotional stimulus, or metabolic problems. °· An obstruction in the gastrointestinal tract (bowel obstruction). °· Illnesses such as diabetes, hepatitis, gallbladder problems, appendicitis, kidney problems, cancer, sepsis, atypical symptoms of a heart attack, or eating disorders. °· Medical treatments such as chemotherapy and radiation. °· Receiving medicine that makes you sleep (general anesthetic) during surgery. °DIAGNOSIS °Your caregiver may ask for tests to be done if the problems do not improve after a few days. Tests may also be done if symptoms are severe or if the reason for the nausea and vomiting is not clear. Tests may include: °· Urine tests. °· Blood tests. °· Stool tests. °· Cultures (to look for evidence of infection). °· X-rays or other imaging studies. °Test results can help your caregiver make decisions about treatment or the need for additional tests. °TREATMENT °You need to stay well hydrated. Drink frequently but in small amounts. You may wish to drink water, sports drinks, clear broth, or eat frozen  ice pops or gelatin dessert to help stay hydrated. When you eat, eating slowly may help prevent nausea. There are also some antinausea medicines that may help prevent nausea. °HOME CARE INSTRUCTIONS  °· Take all medicine as directed by your caregiver. °· If you do not have an appetite, do not force yourself to eat. However, you must continue to drink fluids. °· If you have an appetite, eat a normal diet unless your caregiver tells you differently. °¨ Eat a variety of complex carbohydrates (rice, wheat, potatoes, bread), lean meats, yogurt, fruits, and vegetables. °¨ Avoid high-fat foods because they are more difficult to digest. °· Drink enough water and fluids to keep your urine clear or pale yellow. °· If you are dehydrated, ask your caregiver for specific rehydration instructions. Signs of dehydration may include: °¨ Severe thirst. °¨ Dry lips and mouth. °¨ Dizziness. °¨ Dark urine. °¨ Decreasing urine frequency and amount. °¨ Confusion. °¨ Rapid breathing or pulse. °SEEK IMMEDIATE MEDICAL CARE IF:  °· You have blood or brown flecks (like coffee grounds) in your vomit. °· You have black or bloody stools. °· You have a severe headache or stiff neck. °· You are confused. °· You have severe abdominal pain. °· You have chest pain or trouble breathing. °· You do not urinate at least once every 8 hours. °· You develop cold or clammy skin. °· You continue to vomit for longer than 24 to 48 hours. °· You have a fever. °MAKE SURE YOU:  °· Understand these instructions. °· Will watch your condition. °· Will get help right away if you are not doing well or get worse. °Document Released: 09/06/2005 Document Revised: 11/29/2011 Document Reviewed: 02/03/2011 °Exit[employer]® Patient Information ©2015 [employer]. This information is not intended   to replace advice given to you by your health care provider. Make sure you discuss any questions you have with your health care provider. ° °Abdominal Pain, Women °Abdominal (stomach,  pelvic, or belly) pain can be caused by many things. It is important to tell your doctor: °· The location of the pain. °· Does it come and go or is it present all the time? °· Are there things that start the pain (eating certain foods, exercise)? °· Are there other symptoms associated with the pain (fever, nausea, vomiting, diarrhea)? °All of this is helpful to know when trying to find the cause of the pain. °CAUSES  °· Stomach: virus or bacteria infection, or ulcer. °· Intestine: appendicitis (inflamed appendix), regional ileitis (Crohn's disease), ulcerative colitis (inflamed colon), irritable bowel syndrome, diverticulitis (inflamed diverticulum of the colon), or cancer of the stomach or intestine. °· Gallbladder disease or stones in the gallbladder. °· Kidney disease, kidney stones, or infection. °· Pancreas infection or cancer. °· Fibromyalgia (pain disorder). °· Diseases of the female organs: °¨ Uterus: fibroid (non-cancerous) tumors or infection. °¨ Fallopian tubes: infection or tubal pregnancy. °¨ Ovary: cysts or tumors. °¨ Pelvic adhesions (scar tissue). °¨ Endometriosis (uterus lining tissue growing in the pelvis and on the pelvic organs). °¨ Pelvic congestion syndrome (female organs filling up with blood just before the menstrual period). °¨ Pain with the menstrual period. °¨ Pain with ovulation (producing an egg). °¨ Pain with an IUD (intrauterine device, birth control) in the uterus. °¨ Cancer of the female organs. °· Functional pain (pain not caused by a disease, may improve without treatment). °· Psychological pain. °· Depression. °DIAGNOSIS  °Your doctor will decide the seriousness of your pain by doing an examination. °· Blood tests. °· X-rays. °· Ultrasound. °· CT scan (computed tomography, special type of X-ray). °· MRI (magnetic resonance imaging). °· Cultures, for infection. °· Barium enema (dye inserted in the large intestine, to better view it with X-rays). °· Colonoscopy (looking in intestine  with a lighted tube). °· Laparoscopy (minor surgery, looking in abdomen with a lighted tube). °· Major abdominal exploratory surgery (looking in abdomen with a large incision). °TREATMENT  °The treatment will depend on the cause of the pain.  °· Many cases can be observed and treated at home. °· Over-the-counter medicines recommended by your caregiver. °· Prescription medicine. °· Antibiotics, for infection. °· Birth control pills, for painful periods or for ovulation pain. °· Hormone treatment, for endometriosis. °· Nerve blocking injections. °· Physical therapy. °· Antidepressants. °· Counseling with a psychologist or psychiatrist. °· Minor or major surgery. °HOME CARE INSTRUCTIONS  °· Do not take laxatives, unless directed by your caregiver. °· Take over-the-counter pain medicine only if ordered by your caregiver. Do not take aspirin because it can cause an upset stomach or bleeding. °· Try a clear liquid diet (broth or water) as ordered by your caregiver. Slowly move to a bland diet, as tolerated, if the pain is related to the stomach or intestine. °· Have a thermometer and take your temperature several times a day, and record it. °· Bed rest and sleep, if it helps the pain. °· Avoid sexual intercourse, if it causes pain. °· Avoid stressful situations. °· Keep your follow-up appointments and tests, as your caregiver orders. °· If the pain does not go away with medicine or surgery, you may try: °¨ Acupuncture. °¨ Relaxation exercises (yoga, meditation). °¨ Group therapy. °¨ Counseling. °SEEK MEDICAL CARE IF:  °· You notice certain foods cause stomach pain. °·   Your home care treatment is not helping your pain. °· You need stronger pain medicine. °· You want your IUD removed. °· You feel faint or lightheaded. °· You develop nausea and vomiting. °· You develop a rash. °· You are having side effects or an allergy to your medicine. °SEEK IMMEDIATE MEDICAL CARE IF:  °· Your pain does not go away or gets worse. °· You  have a fever. °· Your pain is felt only in portions of the abdomen. The right side could possibly be appendicitis. The left lower portion of the abdomen could be colitis or diverticulitis. °· You are passing blood in your stools (bright red or black tarry stools, with or without vomiting). °· You have blood in your urine. °· You develop chills, with or without a fever. °· You pass out. °MAKE SURE YOU:  °· Understand these instructions. °· Will watch your condition. °· Will get help right away if you are not doing well or get worse. °Document Released: 07/04/2007 Document Revised: 01/21/2014 Document Reviewed: 07/24/2009 °Exit[employer]® Patient Information ©2015 [employer]. This information is not intended to replace advice given to you by your health care provider. Make sure you discuss any questions you have with your health care provider. ° °

## 2014-10-01 NOTE — ED Notes (Signed)
PT A/O X 4 ON D/C. PT REFUSED WHEELCHAIR.

## 2014-10-01 NOTE — ED Provider Notes (Signed)
CSN: [id number]     Arrival date & time 10/01/14  0302 History  This chart was scribed for Loren Racer, MD by Annye Asa, ED Scribe. This patient was seen in room B17C/B17C and the patient's care was started at 3:18 AM.    Chief Complaint  Patient presents with  . Emesis  . Back Pain   The history is provided by the patient. No language interpreter was used.     HPI Comments: [name] is a 39 y.o. female who presents to the Emergency Department complaining of vomiting beginning at 23:00. Patient reports that she woke from sleep tonight with "pain in between her shoulder blades, like gas." She took some Zantac and felt nauseous; she began vomiting and has vomited 7x so far tonight. She currently reports upper abdominal pain that radiates to her back. She denies diarrhea, fevers, chills, recent international travel.   She reports that last night for dinner (18:45) she was the only one who had a certain salad dressing - she denies any other suspicious food intake at this time.   Past Medical History  Diagnosis Date  . Hypertension   . Hyperlipidemia   . GERD (gastroesophageal reflux disease)   . Asthma     no inhaler  . Depression   . Diabetes mellitus     on insulin during this pregnancy  . Migraine headache    Past Surgical History  Procedure Laterality Date  . Cholecystectomy    . Wisdom tooth extraction    . Cesarean section with bilateral tubal ligation  07/18/2012    Procedure: CESAREAN SECTION WITH BILATERAL TUBAL LIGATION;  Surgeon: Mickel Baas, MD;  Location: [facility];  Service: Obstetrics;  Laterality: Bilateral;   Family History  Problem Relation Age of Onset  . Heart disease Mother   . COPD Mother   . Hypertension Mother   . Hyperlipidemia Mother   . Hearing loss Maternal Grandmother    History  Substance Use Topics  . Smoking status: Current Every Day Smoker -- 0.25 packs/day  . Smokeless tobacco: Never Used  . Alcohol Use: No   OB History     Gravida Para Term Preterm AB TAB SAB Ectopic Multiple Living   0 1 0 1 0 0 2     Review of Systems  Constitutional: Negative for fever and chills.  Respiratory: Negative for cough and shortness of breath.   Cardiovascular: Negative for chest pain, palpitations and leg swelling.  Gastrointestinal: Positive for nausea, vomiting and abdominal pain. Negative for diarrhea and constipation.  Genitourinary: Negative for dysuria and flank pain.  Musculoskeletal: Positive for back pain. Negative for myalgias, neck pain and neck stiffness.  Skin: Negative for rash and wound.  Neurological: Negative for dizziness, weakness, light-headedness, numbness and headaches.  All other systems reviewed and are negative.   Allergies  Levofloxacin; Penicillins; Shellfish allergy; Sulfa antibiotics; Tequin; Imitrex; and Betadine  Home Medications   Prior to Admission medications   Medication Sig Start Date End Date Taking? Authorizing Provider  Aspirin-Acetaminophen-Caffeine (GOODYS EXTRA STRENGTH) 260-130-16 MG TABS Take 1 packet by mouth every 6 (six) hours as needed. pain   Yes Historical Provider, MD  buPROPion (WELLBUTRIN SR) 150 MG 12 hr tablet Take 150 mg by mouth 2 (two) times daily.   Yes Historical Provider, MD  cetirizine (ZYRTEC) 10 MG tablet Take 10 mg by mouth daily as needed. For allergies   Yes Historical Provider, MD  DULoxetine (CYMBALTA) 60 MG capsule  Take 60 mg by mouth daily.   Yes Historical Provider, MD  ibuprofen (ADVIL,MOTRIN) 200 MG tablet Take 800 mg by mouth every 8 (eight) hours as needed. pain   Yes Historical Provider, MD  lisinopril (PRINIVIL,ZESTRIL) 20 MG tablet Take 20 mg by mouth daily.   Yes Historical Provider, MD  metFORMIN (GLUCOPHAGE) 500 MG tablet Take 500 mg by mouth 2 (two) times daily with a meal.   Yes Historical Provider, MD  omeprazole (PRILOSEC) 40 MG capsule Take 40 mg by mouth 2 (two) times daily.   Yes Historical Provider, MD  topiramate (TOPAMAX)  200 MG tablet Take 200 mg by mouth 2 (two) times daily.   Yes Historical Provider, MD  HYDROcodone-acetaminophen (NORCO/VICODIN) 5-325 MG per tablet Take 1 tablet by mouth every 4 (four) hours as needed. Patient not taking: Reported on 10/01/2014 04/20/14   Burgess AmorJulie Idol, PA-C  nitrofurantoin, macrocrystal-monohydrate, (MACROBID) 100 MG capsule Take 1 capsule (100 mg total) by mouth 2 (two) times daily. 10/01/14   Loren Raceravid Jaise Moser, MD  oxyCODONE-acetaminophen (PERCOCET/ROXICET) 5-325 MG per tablet Take 1 tablet by mouth every 6 (six) hours as needed. Patient not taking: Reported on 10/01/2014 05/07/14   Benny LennertJoseph L Zammit, MD  promethazine (PHENERGAN) 25 MG suppository Place 1 suppository (25 mg total) rectally every 6 (six) hours as needed for nausea or vomiting. 10/01/14   Loren Raceravid Vilda Zollner, MD  promethazine (PHENERGAN) 25 MG tablet Take 1 tablet (25 mg total) by mouth every 6 (six) hours as needed for nausea. 11/08/11 11/15/11  Samuel JesterKathleen McManus, DO   BP 149/78 mmHg  Pulse 65  Temp(Src) 97.9 F (36.6 C) (Oral)  Resp 16  Ht 5\' 5"  (1.651 m)  Wt 225 lb (102.059 kg)  BMI 37.44 kg/m2  SpO2 100%  LMP 09/23/2014 Physical Exam  Constitutional: She is oriented to person, place, and time. She appears well-developed and well-nourished. No distress.  HENT:  Head: Normocephalic and atraumatic.  Mouth/Throat: Oropharynx is clear and moist.  Eyes: EOM are normal. Pupils are equal, round, and reactive to light.  Neck: Normal range of motion. Neck supple.  Cardiovascular: Normal rate and regular rhythm.   Pulmonary/Chest: Effort normal and breath sounds normal. No respiratory distress. She has no wheezes. She has no rales. She exhibits no tenderness.  Abdominal: Soft. Bowel sounds are normal. She exhibits no distension and no mass. There is tenderness (Very mild epigastric tenderness to palpation.). There is no rebound and no guarding.  Musculoskeletal: Normal range of motion. She exhibits tenderness (chest palpation on  the right thoracic back just medial to the scapula). She exhibits no edema.  No CVA tenderness bilaterally.  Neurological: She is alert and oriented to person, place, and time.  Skin: Skin is warm and dry. No rash noted. No erythema.  Psychiatric: She has a normal mood and affect. Her behavior is normal.  Nursing note and vitals reviewed.   ED Course  Procedures   DIAGNOSTIC STUDIES: Oxygen Saturation is 100% on RA, normal by my interpretation.    COORDINATION OF CARE: 3:22 AM Discussed treatment plan with pt at bedside and pt agreed to plan.   Labs Review Labs Reviewed  COMPREHENSIVE METABOLIC PANEL - Abnormal; Notable for the following:    Glucose, Bld 156 (*)    GFR calc non Af Amer 82 (*)    All other components within normal limits  URINALYSIS, ROUTINE W REFLEX MICROSCOPIC - Abnormal; Notable for the following:    APPearance TURBID (*)    All other components within  normal limits  URINE MICROSCOPIC-ADD ON - Abnormal; Notable for the following:    Bacteria, UA MANY (*)    All other components within normal limits  CBC WITH DIFFERENTIAL  LIPASE, BLOOD    Imaging Review No results found.   EKG Interpretation None      MDM   Final diagnoses:  Non-intractable vomiting with nausea, vomiting of unspecified type  UTI (lower urinary tract infection)  Epigastric pain    I personally performed the services described in this documentation, which was scribed in my presence. The recorded information has been reviewed and considered.   Patient is resting comfortably. Tolerating oral fluids. Return precautions given.  Loren Racer, MD 10/01/14 [phone]

## 2014-10-02 ENCOUNTER — Emergency Department (HOSPITAL_COMMUNITY)
Admission: EM | Admit: 2014-10-02 | Discharge: 2014-10-03 | Disposition: A | Discharge status: Home / Self Care | Payer: Self-pay | Attending: Emergency Medicine | Admitting: Emergency Medicine

## 2014-10-02 ENCOUNTER — Encounter (HOSPITAL_COMMUNITY): Payer: Self-pay | Admitting: Emergency Medicine

## 2014-10-02 DIAGNOSIS — F329 Major depressive disorder, single episode, unspecified: Secondary | ICD-10-CM | POA: Insufficient documentation

## 2014-10-02 DIAGNOSIS — Z880 Allergy status to penicillin: Secondary | ICD-10-CM | POA: Insufficient documentation

## 2014-10-02 DIAGNOSIS — R112 Nausea with vomiting, unspecified: Secondary | ICD-10-CM | POA: Insufficient documentation

## 2014-10-02 DIAGNOSIS — J45909 Unspecified asthma, uncomplicated: Secondary | ICD-10-CM | POA: Insufficient documentation

## 2014-10-02 DIAGNOSIS — R1114 Bilious vomiting: Secondary | ICD-10-CM

## 2014-10-02 DIAGNOSIS — Z720 Tobacco use: Secondary | ICD-10-CM | POA: Insufficient documentation

## 2014-10-02 DIAGNOSIS — R1030 Lower abdominal pain, unspecified: Secondary | ICD-10-CM | POA: Insufficient documentation

## 2014-10-02 DIAGNOSIS — K219 Gastro-esophageal reflux disease without esophagitis: Secondary | ICD-10-CM | POA: Insufficient documentation

## 2014-10-02 DIAGNOSIS — I10 Essential (primary) hypertension: Secondary | ICD-10-CM | POA: Insufficient documentation

## 2014-10-02 DIAGNOSIS — G43909 Migraine, unspecified, not intractable, without status migrainosus: Secondary | ICD-10-CM | POA: Insufficient documentation

## 2014-10-02 DIAGNOSIS — E119 Type 2 diabetes mellitus without complications: Secondary | ICD-10-CM | POA: Insufficient documentation

## 2014-10-02 DIAGNOSIS — Z79899 Other long term (current) drug therapy: Secondary | ICD-10-CM | POA: Insufficient documentation

## 2014-10-02 LAB — URINALYSIS, ROUTINE W REFLEX MICROSCOPIC
Bilirubin Urine: NEGATIVE
Glucose, UA: NEGATIVE mg/dL
Hgb urine dipstick: NEGATIVE
Ketones, ur: NEGATIVE mg/dL
Nitrite: NEGATIVE
Protein, ur: NEGATIVE mg/dL
Specific Gravity, Urine: 1.021 (ref 1.005–1.030)
Urobilinogen, UA: 0.2 mg/dL (ref 0.0–1.0)
pH: 5.5 (ref 5.0–8.0)

## 2014-10-02 LAB — CBC WITH DIFFERENTIAL/PLATELET
BASOS ABS: 0 10*3/uL (ref 0.0–0.1)
Basophils Relative: 0 % (ref 0–1)
Eosinophils Absolute: 0.1 10*3/uL (ref 0.0–0.7)
Eosinophils Relative: 1 % (ref 0–5)
HCT: 42.6 % (ref 36.0–46.0)
HEMOGLOBIN: 13.8 g/dL (ref 12.0–15.0)
Lymphocytes Relative: 33 % (ref 12–46)
Lymphs Abs: 3.7 10*3/uL (ref 0.7–4.0)
MCH: 28.3 pg (ref 26.0–34.0)
MCHC: 32.4 g/dL (ref 30.0–36.0)
MCV: 87.3 fL (ref 78.0–100.0)
MONO ABS: 0.6 10*3/uL (ref 0.1–1.0)
Monocytes Relative: 5 % (ref 3–12)
NEUTROS PCT: 61 % (ref 43–77)
Neutro Abs: 6.8 10*3/uL (ref 1.7–7.7)
Platelets: ADEQUATE 10*3/uL (ref 150–400)
RBC: 4.88 MIL/uL (ref 3.87–5.11)
RDW: 14.6 % (ref 11.5–15.5)
SMEAR REVIEW: ADEQUATE
WBC: 11.2 10*3/uL — ABNORMAL HIGH (ref 4.0–10.5)

## 2014-10-02 LAB — URINE MICROSCOPIC-ADD ON

## 2014-10-02 LAB — COMPREHENSIVE METABOLIC PANEL
ALBUMIN: 3.9 g/dL (ref 3.5–5.2)
ALT: 22 U/L (ref 0–35)
ANION GAP: 10 (ref 5–15)
AST: 21 U/L (ref 0–37)
Alkaline Phosphatase: 70 U/L (ref 39–117)
BILIRUBIN TOTAL: 0.7 mg/dL (ref 0.3–1.2)
BUN: 8 mg/dL (ref 6–23)
CO2: 20 mmol/L (ref 19–32)
CREATININE: 0.87 mg/dL (ref 0.50–1.10)
Calcium: 9.5 mg/dL (ref 8.4–10.5)
Chloride: 110 mEq/L (ref 96–112)
GFR calc Af Amer: >90 mL/min (ref >90)
GFR calc non Af Amer: 82 mL/min — ABNORMAL LOW (ref >90)
Glucose, Bld: 128 mg/dL — ABNORMAL HIGH (ref 70–99)
Potassium: 3.7 mmol/L (ref 3.5–5.1)
Sodium: 140 mmol/L (ref 135–145)
Total Protein: 7.1 g/dL (ref 6.0–8.3)

## 2014-10-02 MED ORDER — SODIUM CHLORIDE 0.9 % IV BOLUS (SEPSIS)
1000.0000 mL | Freq: Once | INTRAVENOUS | Status: AC
  Administered 2014-10-02: 1000 mL via INTRAVENOUS

## 2014-10-02 MED ORDER — MORPHINE SULFATE 4 MG/ML IJ SOLN
4.0000 mg | Freq: Once | INTRAMUSCULAR | Status: AC
Start: 2014-10-03 — End: 2014-10-03
  Administered 2014-10-03: 4 mg via INTRAVENOUS
  Filled 2014-10-02: qty 1

## 2014-10-02 MED ORDER — MORPHINE SULFATE 4 MG/ML IJ SOLN
4.0000 mg | Freq: Once | INTRAMUSCULAR | Status: AC
  Administered 2014-10-02: 4 mg via INTRAVENOUS
  Filled 2014-10-02: qty 1

## 2014-10-02 MED ORDER — METOCLOPRAMIDE HCL 5 MG/ML IJ SOLN
10.0000 mg | Freq: Once | INTRAMUSCULAR | Status: AC
  Administered 2014-10-02: 10 mg via INTRAVENOUS
  Filled 2014-10-02: qty 2

## 2014-10-02 NOTE — ED Notes (Signed)
Pt states she has been having rt flank pain radiating to front and emesis since Monday. Pt was seen by pcp and treated for UTI but was unable to keep antibiotics down. Pt states she has received a shot of rocephin at doctors office and was given phenergan with no relief. Pt states she has vomited 2x since being here. Rates flank pain 8/10.

## 2014-10-02 NOTE — ED Provider Notes (Signed)
CSN: [id number]     Arrival date & time 10/02/14  1908 History   First MD Initiated Contact with Patient 10/02/14 2157     Chief Complaint  Patient presents with  . Emesis     (Consider location/radiation/quality/duration/timing/severity/associated sxs/prior Treatment) HPI Pt is a 39yo female presenting to ED with c/o right flank pain and persistent nausea with vomiting. Pt was seen last night for back pain, nausea and vomiting. Dx with a UTI but states today, she has not been able to keep her antibiotics or nausea medication down due to vomiting. She was seen by her PCP, Dr. Cyndia Bent, around 12PM today, given a shot of rocephin and phenergan but advised to come to ED for additional evaluation.  Pt has had 7 episodes of emesis today, 2 while waiting in ED. Pt states she is having "projectile vomiting" with bile in emesis.  Denies hx of pancreatitis, had a cholecystectomy several years ago.  Hx of renal stone but many years ago. Pt states stones have never made her as sick as she is now.  Denies any difficulty urinating. States yesterday she was dx with a UTI but was asymptomatic as far as hematuria, frequency, decreased urine or dysuria.  Denies chest pain or SOB. Denies diarrhea. No sick contacts or recent travel. Pt had a normal BM today.    Past Medical History  Diagnosis Date  . Hypertension   . Hyperlipidemia   . GERD (gastroesophageal reflux disease)   . Asthma     no inhaler  . Depression   . Diabetes mellitus     on insulin during this pregnancy  . Migraine headache    Past Surgical History  Procedure Laterality Date  . Cholecystectomy    . Wisdom tooth extraction    . Cesarean section with bilateral tubal ligation  07/18/2012    Procedure: CESAREAN SECTION WITH BILATERAL TUBAL LIGATION;  Surgeon: Mickel Baas, MD;  Location: [facility];  Service: Obstetrics;  Laterality: Bilateral;   Family History  Problem Relation Age of Onset  . Heart disease Mother   . COPD Mother   .  Hypertension Mother   . Hyperlipidemia Mother   . Hearing loss Maternal Grandmother    History  Substance Use Topics  . Smoking status: Current Every Day Smoker -- 0.25 packs/day  . Smokeless tobacco: Never Used  . Alcohol Use: No   OB History    Gravida Para Term Preterm AB TAB SAB Ectopic Multiple Living   0 1 0 1 0 0 2     Review of Systems  Constitutional: Positive for appetite change. Negative for fever and chills.  Respiratory: Negative for cough and shortness of breath.   Gastrointestinal: Positive for nausea, vomiting and abdominal pain ( right flank). Negative for diarrhea, constipation and blood in stool.  Genitourinary: Positive for flank pain ( right). Negative for dysuria, urgency, hematuria, decreased urine volume, vaginal bleeding, vaginal discharge, vaginal pain, menstrual problem and pelvic pain.  Musculoskeletal: Positive for back pain ( right lower back). Negative for myalgias.  All other systems reviewed and are negative.     Allergies  Levofloxacin; Penicillins; Shellfish allergy; Sulfa antibiotics; Tequin; Imitrex; and Betadine  Home Medications   Prior to Admission medications   Medication Sig Start Date End Date Taking? Authorizing Provider  buPROPion (WELLBUTRIN SR) 150 MG 12 hr tablet Take 150 mg by mouth 2 (two) times daily.   Yes Historical Provider, MD  DULoxetine (CYMBALTA) 60 MG capsule Take  60 mg by mouth daily.   Yes Historical Provider, MD  ibuprofen (ADVIL,MOTRIN) 200 MG tablet Take 800 mg by mouth every 8 (eight) hours as needed. pain   Yes Historical Provider, MD  lisinopril (PRINIVIL,ZESTRIL) 20 MG tablet Take 20 mg by mouth daily.   Yes Historical Provider, MD  metFORMIN (GLUCOPHAGE) 500 MG tablet Take 500 mg by mouth 2 (two) times daily with a meal.   Yes Historical Provider, MD  nitrofurantoin, macrocrystal-monohydrate, (MACROBID) 100 MG capsule Take 1 capsule (100 mg total) by mouth 2 (two) times daily. 10/01/14  Yes Loren Raceravid  [place], MD  omeprazole (PRILOSEC) 40 MG capsule Take 40 mg by mouth 2 (two) times daily.   Yes Historical Provider, MD  promethazine (PHENERGAN) 25 MG suppository Place 1 suppository (25 mg total) rectally every 6 (six) hours as needed for nausea or vomiting. 10/01/14  Yes Loren Raceravid [place]el[place], MD  promethazine (PHENERGAN) 25 MG tablet Take 1 tablet (25 mg total) by mouth every 6 (six) hours as needed for nausea. 11/08/11 10/02/14 Yes Samuel JesterKathleen McManus, DO  topiramate (TOPAMAX) 200 MG tablet Take 200 mg by mouth 2 (two) times daily.   Yes Historical Provider, MD  oxyCODONE-acetaminophen (PERCOCET/ROXICET) 5-325 MG per tablet Take 1 tablet by mouth every 6 (six) hours as needed. Patient not taking: Reported on 10/01/2014 05/07/14   Benny LennertJoseph L Zammit, MD   BP 111/58 mmHg  Pulse 75  Temp(Src) 98.1 F (36.7 C) (Oral)  Resp 18  SpO2 99%  LMP 09/23/2014 Physical Exam  Constitutional: She appears well-developed and well-nourished. No distress.  HENT:  Head: Normocephalic and atraumatic.  Eyes: Conjunctivae are normal. No scleral icterus.  Neck: Normal range of motion.  Cardiovascular: Normal rate, regular rhythm and normal heart sounds.   Pulmonary/Chest: Effort normal and breath sounds normal. No respiratory distress. She has no wheezes. She has no rales. She exhibits no tenderness.  Abdominal: Soft. Bowel sounds are normal. She exhibits no distension and no mass. There is tenderness. There is no rebound and no guarding.  Obese abdomen, soft, tenderness to right flank w/o rebound or guarding. Right CVAT.   Musculoskeletal: Normal range of motion.  Neurological: She is alert.  Skin: Skin is warm and dry. She is not diaphoretic.  Nursing note and vitals reviewed.   ED Course  Procedures (including critical care time) Labs Review Labs Reviewed  CBC WITH DIFFERENTIAL - Abnormal; Notable for the following:    WBC 11.2 (*)    All other components within normal limits  COMPREHENSIVE METABOLIC PANEL -  Abnormal; Notable for the following:    Glucose, Bld 128 (*)    GFR calc non Af Amer 82 (*)    All other components within normal limits  URINALYSIS, ROUTINE W REFLEX MICROSCOPIC - Abnormal; Notable for the following:    APPearance CLOUDY (*)    Leukocytes, UA SMALL (*)    All other components within normal limits  URINE MICROSCOPIC-ADD ON - Abnormal; Notable for the following:    Squamous Epithelial / LPF FEW (*)    Bacteria, UA FEW (*)    Casts HYALINE CASTS (*)    All other components within normal limits  URINE CULTURE    Imaging Review No results found.   EKG Interpretation None      MDM   Final diagnoses:  None    Pt with hx of renal stones c/o right flank pain and persistent nausea and vomiting that started last night. Pt was tx for UTI. Pt unable to  keep down antibiotics at home. She received IM rocephin and phenergan at PCP earlier today. Pt is non-toxic appearing. Afebrile. Right CVAT and right flank tenderness on exam.  Labs reviewed from yesterday and today, mildly elevated WBC, otherwise unremarkable. Pt has had a cholecystectomy several years ago. Will get stone study to ensure no ureteral stone causing hydronephrosis.  Pain and nausea managed in ED with morphine and reglan.  12:57 AM Pt signed out to [employer] PA-C at shift change. Plan is to f/u with CT. If unremarkable, discharge home with pain management if able to keep down PO fluids.      Junius Finner, PA-C 10/03/14 1610  Hilario Quarry, MD 10/03/14 [phone]

## 2014-10-02 NOTE — ED Notes (Signed)
Pt. reports persistent nausea and emesis onset last week , seen here last night prescribed with Phenergan supp. and Macrobid for UTI . Denies diarrhea/ no fever .

## 2014-10-03 ENCOUNTER — Emergency Department (HOSPITAL_COMMUNITY): Payer: 59

## 2014-10-03 MED ORDER — HYDROCODONE-ACETAMINOPHEN 5-325 MG PO TABS: 1.0000 | ORAL_TABLET | ORAL | Status: DC | PRN

## 2014-10-03 MED ORDER — HYDROMORPHONE HCL 1 MG/ML IJ SOLN
0.5000 mg | Freq: Once | INTRAMUSCULAR | Status: AC
  Administered 2014-10-03: 0.5 mg via INTRAVENOUS
  Filled 2014-10-03: qty 1

## 2014-10-03 MED ORDER — HYOSCYAMINE SULFATE 0.5 MG/ML IJ SOLN
0.1250 mg | Freq: Once | INTRAMUSCULAR | Status: AC
  Administered 2014-10-03: 0.125 mg via INTRAVENOUS
  Filled 2014-10-03: qty 0.25

## 2014-10-03 MED ORDER — SODIUM CHLORIDE 0.9 % IV BOLUS (SEPSIS)
1000.0000 mL | Freq: Once | INTRAVENOUS | Status: AC
  Administered 2014-10-03: 1000 mL via INTRAVENOUS

## 2014-10-03 NOTE — Discharge Instructions (Signed)

## 2014-10-03 NOTE — ED Provider Notes (Signed)
H/o kidney stones with rt flank pain Seen last night for vomiting - dx UTI, abx and home Today has continued vomiting, sent here for further management.  Afebrile, slight leukocytosis Pending CT stone study  UA - does not show significant infection - recommended holding abx treatment until cultures result CT negative for stones. ?Colitis (mild)  Probably viral enteritis/colitis requiring supportive care. No significant leukocytosis, afebrile, no diarrhea. She has been hydrated in ED with 2 liters NS.  She is tolerating PO fluids without vomiting. Discussed discharge home and patient is comfortable with going home. Discussed return precautions. She has phenergan, will add Norco as needed.  Brianna HookerShari A Stephine Langbehn, PA-C 10/03/14 0321  Brianna Quarryanielle S Ray, MD 10/03/14 [phone]

## 2014-10-03 NOTE — ED Notes (Signed)
Pt reports she was able to keep fluids and crackers down but after eating them her stomach began cramping and making loud noises. Melvenia BeamShari PA notified.

## 2014-10-04 LAB — URINE CULTURE
Colony Count: NO GROWTH
Culture: NO GROWTH

## 2014-10-07 ENCOUNTER — Encounter (HOSPITAL_COMMUNITY): Payer: Self-pay | Admitting: Family Medicine

## 2014-10-07 ENCOUNTER — Emergency Department (HOSPITAL_COMMUNITY)
Admission: EM | Admit: 2014-10-07 | Discharge: 2014-10-08 | Disposition: A | Discharge status: Home / Self Care | Payer: Self-pay | Attending: Emergency Medicine | Admitting: Emergency Medicine

## 2014-10-07 DIAGNOSIS — J45909 Unspecified asthma, uncomplicated: Secondary | ICD-10-CM | POA: Insufficient documentation

## 2014-10-07 DIAGNOSIS — E119 Type 2 diabetes mellitus without complications: Secondary | ICD-10-CM | POA: Insufficient documentation

## 2014-10-07 DIAGNOSIS — Z79899 Other long term (current) drug therapy: Secondary | ICD-10-CM | POA: Insufficient documentation

## 2014-10-07 DIAGNOSIS — K219 Gastro-esophageal reflux disease without esophagitis: Secondary | ICD-10-CM | POA: Insufficient documentation

## 2014-10-07 DIAGNOSIS — R1013 Epigastric pain: Secondary | ICD-10-CM | POA: Insufficient documentation

## 2014-10-07 DIAGNOSIS — R109 Unspecified abdominal pain: Secondary | ICD-10-CM

## 2014-10-07 DIAGNOSIS — I10 Essential (primary) hypertension: Secondary | ICD-10-CM | POA: Insufficient documentation

## 2014-10-07 DIAGNOSIS — Z880 Allergy status to penicillin: Secondary | ICD-10-CM | POA: Insufficient documentation

## 2014-10-07 DIAGNOSIS — F329 Major depressive disorder, single episode, unspecified: Secondary | ICD-10-CM | POA: Insufficient documentation

## 2014-10-07 DIAGNOSIS — R112 Nausea with vomiting, unspecified: Secondary | ICD-10-CM | POA: Insufficient documentation

## 2014-10-07 DIAGNOSIS — Z3202 Encounter for pregnancy test, result negative: Secondary | ICD-10-CM | POA: Insufficient documentation

## 2014-10-07 DIAGNOSIS — Z720 Tobacco use: Secondary | ICD-10-CM | POA: Insufficient documentation

## 2014-10-07 DIAGNOSIS — G43909 Migraine, unspecified, not intractable, without status migrainosus: Secondary | ICD-10-CM | POA: Insufficient documentation

## 2014-10-07 LAB — COMPREHENSIVE METABOLIC PANEL
ALK PHOS: 67 U/L (ref 39–117)
ALT: 17 U/L (ref 0–35)
ANION GAP: 10 (ref 5–15)
AST: 19 U/L (ref 0–37)
Albumin: 3.9 g/dL (ref 3.5–5.2)
BUN: 12 mg/dL (ref 6–23)
CO2: 21 mmol/L (ref 19–32)
CREATININE: 0.87 mg/dL (ref 0.50–1.10)
Calcium: 9.3 mg/dL (ref 8.4–10.5)
Chloride: 105 mEq/L (ref 96–112)
GFR calc non Af Amer: 82 mL/min — ABNORMAL LOW (ref >90)
Glucose, Bld: 126 mg/dL — ABNORMAL HIGH (ref 70–99)
POTASSIUM: 3.7 mmol/L (ref 3.5–5.1)
Sodium: 136 mmol/L (ref 135–145)
Total Bilirubin: 0.6 mg/dL (ref 0.3–1.2)
Total Protein: 6.9 g/dL (ref 6.0–8.3)

## 2014-10-07 LAB — CBC WITH DIFFERENTIAL/PLATELET
BASOS ABS: 0 10*3/uL (ref 0.0–0.1)
Basophils Relative: 0 % (ref 0–1)
Eosinophils Absolute: 0.2 10*3/uL (ref 0.0–0.7)
Eosinophils Relative: 2 % (ref 0–5)
HCT: 42.6 % (ref 36.0–46.0)
Hemoglobin: 13.7 g/dL (ref 12.0–15.0)
LYMPHS ABS: 2.5 10*3/uL (ref 0.7–4.0)
Lymphocytes Relative: 27 % (ref 12–46)
MCH: 28 pg (ref 26.0–34.0)
MCHC: 32.2 g/dL (ref 30.0–36.0)
MCV: 86.9 fL (ref 78.0–100.0)
MONO ABS: 0.5 10*3/uL (ref 0.1–1.0)
Monocytes Relative: 5 % (ref 3–12)
NEUTROS ABS: 6.2 10*3/uL (ref 1.7–7.7)
NEUTROS PCT: 66 % (ref 43–77)
Platelets: 272 10*3/uL (ref 150–400)
RBC: 4.9 MIL/uL (ref 3.87–5.11)
RDW: 14.7 % (ref 11.5–15.5)
WBC: 9.4 10*3/uL (ref 4.0–10.5)

## 2014-10-07 LAB — URINALYSIS, ROUTINE W REFLEX MICROSCOPIC
Bilirubin Urine: NEGATIVE
Glucose, UA: NEGATIVE mg/dL
Hgb urine dipstick: NEGATIVE
Ketones, ur: NEGATIVE mg/dL
Leukocytes, UA: NEGATIVE
Nitrite: NEGATIVE
Protein, ur: NEGATIVE mg/dL
Specific Gravity, Urine: 1.014 (ref 1.005–1.030)
Urobilinogen, UA: 0.2 mg/dL (ref 0.0–1.0)
pH: 7.5 (ref 5.0–8.0)

## 2014-10-07 LAB — LIPASE, BLOOD: LIPASE: 25 U/L (ref 11–59)

## 2014-10-07 LAB — PREGNANCY, URINE: Preg Test, Ur: NEGATIVE

## 2014-10-07 MED ORDER — ONDANSETRON HCL 4 MG/2ML IJ SOLN
4.0000 mg | Freq: Once | INTRAMUSCULAR | Status: AC
  Administered 2014-10-07: 4 mg via INTRAVENOUS
  Filled 2014-10-07: qty 2

## 2014-10-07 MED ORDER — HYDROMORPHONE HCL 1 MG/ML IJ SOLN
1.0000 mg | Freq: Once | INTRAMUSCULAR | Status: AC
  Administered 2014-10-07: 1 mg via INTRAVENOUS
  Filled 2014-10-07: qty 1

## 2014-10-07 MED ORDER — HYDROMORPHONE HCL 1 MG/ML IJ SOLN
1.0000 mg | Freq: Once | INTRAMUSCULAR | Status: AC
  Administered 2014-10-08: 1 mg via INTRAVENOUS
  Filled 2014-10-07: qty 1

## 2014-10-07 MED ORDER — SODIUM CHLORIDE 0.9 % IV BOLUS (SEPSIS)
1000.0000 mL | Freq: Once | INTRAVENOUS | Status: AC
  Administered 2014-10-07: 1000 mL via INTRAVENOUS

## 2014-10-07 NOTE — ED Notes (Signed)
pt having RUQ pain with N,V x 8 days. sts every time shes eats she vomits.

## 2014-10-07 NOTE — ED Provider Notes (Signed)
CSN: [id number]     Arrival date & time 10/07/14  1825 History   First MD Initiated Contact with Patient 10/07/14 1940     Chief Complaint  Patient presents with  . Abdominal Pain     (Consider location/radiation/quality/duration/timing/severity/associated sxs/prior Treatment) HPI Comments: Patient with past medical history of diabetes, hypertension, hyperlipidemia, and GERD presents to the emergency department with chief complaint of persistent nausea and vomiting. She states that she has been seen twice in the emergency department in the past week for this. She still has not had any relief of her symptoms. She still complains of moderate upper abdominal pain. She states that the symptoms worsened when she eats. She states that she is unable to tolerate eating anything. Everything comes back up she says. She denies any fevers or chills. Denies any diarrhea or constipation. She does report some dysuria, and states that she is being treated for a UTI, but that she cannot keep the Macrobid pills down. She has tried taking Phenergan with no relief.  The history is provided by the patient. No language interpreter was used.    Past Medical History  Diagnosis Date  . Hypertension   . Hyperlipidemia   . GERD (gastroesophageal reflux disease)   . Asthma     no inhaler  . Depression   . Diabetes mellitus     on insulin during this pregnancy  . Migraine headache    Past Surgical History  Procedure Laterality Date  . Cholecystectomy    . Wisdom tooth extraction    . Cesarean section with bilateral tubal ligation  07/18/2012    Procedure: CESAREAN SECTION WITH BILATERAL TUBAL LIGATION;  Surgeon: Mickel Baas, MD;  Location: [facility];  Service: Obstetrics;  Laterality: Bilateral;   Family History  Problem Relation Age of Onset  . Heart disease Mother   . COPD Mother   . Hypertension Mother   . Hyperlipidemia Mother   . Hearing loss Maternal Grandmother    History  Substance Use Topics   . Smoking status: Current Every Day Smoker -- 0.25 packs/day  . Smokeless tobacco: Never Used  . Alcohol Use: No   OB History    Gravida Para Term Preterm AB TAB SAB Ectopic Multiple Living   3 2 2  0 1 0 1 0 0 2     Review of Systems  Constitutional: Negative for fever and chills.  Respiratory: Negative for shortness of breath.   Cardiovascular: Negative for chest pain.  Gastrointestinal: Positive for nausea and vomiting. Negative for diarrhea and constipation.  Genitourinary: Negative for dysuria.  All other systems reviewed and are negative.     Allergies  Imitrex; Levofloxacin; Penicillins; Shellfish allergy; Sulfa antibiotics; Tequin; and Betadine  Home Medications   Prior to Admission medications   Medication Sig Start Date End Date Taking? Authorizing Provider  buPROPion (WELLBUTRIN SR) 150 MG 12 hr tablet Take 150 mg by mouth 2 (two) times daily.   Yes Historical Provider, MD  DULoxetine (CYMBALTA) 60 MG capsule Take 60 mg by mouth daily.   Yes Historical Provider, MD  HYDROcodone-acetaminophen (NORCO/VICODIN) 5-325 MG per tablet Take 1-2 tablets by mouth every 4 (four) hours as needed. 10/03/14  Yes Shari A Upstill, PA-C  ibuprofen (ADVIL,MOTRIN) 200 MG tablet Take 800 mg by mouth every 8 (eight) hours as needed. pain   Yes Historical Provider, MD  lisinopril (PRINIVIL,ZESTRIL) 20 MG tablet Take 20 mg by mouth daily.   Yes Historical Provider, MD  metFORMIN (GLUCOPHAGE) 500  MG tablet Take 500 mg by mouth 2 (two) times daily with a meal.   Yes Historical Provider, MD  nitrofurantoin, macrocrystal-monohydrate, (MACROBID) 100 MG capsule Take 1 capsule (100 mg total) by mouth 2 (two) times daily. 10/01/14  Yes Loren Racer, MD  omeprazole (PRILOSEC) 40 MG capsule Take 40 mg by mouth 2 (two) times daily.   Yes Historical Provider, MD  promethazine (PHENERGAN) 25 MG tablet Take 1 tablet (25 mg total) by mouth every 6 (six) hours as needed for nausea. 11/08/11 10/07/14 Yes  Samuel Jester, DO  topiramate (TOPAMAX) 200 MG tablet Take 200 mg by mouth 2 (two) times daily.   Yes Historical Provider, MD  oxyCODONE-acetaminophen (PERCOCET/ROXICET) 5-325 MG per tablet Take 1 tablet by mouth every 6 (six) hours as needed. Patient not taking: Reported on 10/01/2014 05/07/14   Benny Lennert, MD  promethazine (PHENERGAN) 25 MG suppository Place 1 suppository (25 mg total) rectally every 6 (six) hours as needed for nausea or vomiting. 10/01/14   Loren Racer, MD   BP 161/92 mmHg  Pulse 68  Temp(Src) 98.4 F (36.9 C) (Oral)  Resp 18  SpO2 100%  LMP 09/23/2014 Physical Exam  Constitutional: She is oriented to person, place, and time. She appears well-developed and well-nourished.  HENT:  Head: Normocephalic and atraumatic.  Eyes: Conjunctivae and EOM are normal. Pupils are equal, round, and reactive to light.  Neck: Normal range of motion. Neck supple.  Cardiovascular: Normal rate and regular rhythm.  Exam reveals no gallop and no friction rub.   No murmur heard. Pulmonary/Chest: Effort normal and breath sounds normal. No respiratory distress. She has no wheezes. She has no rales. She exhibits no tenderness.  Abdominal: Soft. Bowel sounds are normal. She exhibits no distension and no mass. There is no tenderness. There is no rebound and no guarding.  No focal abdominal tenderness, no RLQ tenderness or pain at McBurney's point, no RUQ tenderness or Murphy's sign, no left-sided abdominal tenderness, no fluid wave, or signs of peritonitis   Musculoskeletal: Normal range of motion. She exhibits no edema or tenderness.  Neurological: She is alert and oriented to person, place, and time.  Skin: Skin is warm and dry.  Psychiatric: She has a normal mood and affect. Her behavior is normal. Judgment and thought content normal.  Nursing note and vitals reviewed.   ED Course  Procedures (including critical care time) Results for orders placed or performed during the  hospital encounter of 10/07/14  CBC with Differential  Result Value Ref Range   WBC 9.4 4.0 - 10.5 K/uL   RBC 4.90 3.87 - 5.11 MIL/uL   Hemoglobin 13.7 12.0 - 15.0 g/dL   HCT 60.4 54.0 - 98.1 %   MCV 86.9 78.0 - 100.0 fL   MCH 28.0 26.0 - 34.0 pg   MCHC 32.2 30.0 - 36.0 g/dL   RDW 19.1 47.8 - 29.5 %   Platelets 272 150 - 400 K/uL   Neutrophils Relative % 66 43 - 77 %   Neutro Abs 6.2 1.7 - 7.7 K/uL   Lymphocytes Relative 27 12 - 46 %   Lymphs Abs 2.5 0.7 - 4.0 K/uL   Monocytes Relative 5 3 - 12 %   Monocytes Absolute 0.5 0.1 - 1.0 K/uL   Eosinophils Relative 2 0 - 5 %   Eosinophils Absolute 0.2 0.0 - 0.7 K/uL   Basophils Relative 0 0 - 1 %   Basophils Absolute 0.0 0.0 - 0.1 K/uL  Comprehensive metabolic panel  Result Value Ref Range   Sodium 136 135 - 145 mmol/L   Potassium 3.7 3.5 - 5.1 mmol/L   Chloride 105 96 - 112 mEq/L   CO2 21 19 - 32 mmol/L   Glucose, Bld 126 (H) 70 - 99 mg/dL   BUN 12 6 - 23 mg/dL   Creatinine, Ser 0.98 0.50 - 1.10 mg/dL   Calcium 9.3 8.4 - 11.9 mg/dL   Total Protein 6.9 6.0 - 8.3 g/dL   Albumin 3.9 3.5 - 5.2 g/dL   AST 19 0 - 37 U/L   ALT 17 0 - 35 U/L   Alkaline Phosphatase 67 39 - 117 U/L   Total Bilirubin 0.6 0.3 - 1.2 mg/dL   GFR calc non Af Amer 82 (L) >90 mL/min   GFR calc Af Amer >90 >90 mL/min   Anion gap 10 5 - 15  Lipase, blood  Result Value Ref Range   Lipase 25 11 - 59 U/L  Urinalysis, Routine w reflex microscopic  Result Value Ref Range   Color, Urine YELLOW YELLOW   APPearance CLOUDY (A) CLEAR   Specific Gravity, Urine 1.014 1.005 - 1.030   pH 7.5 5.0 - 8.0   Glucose, UA NEGATIVE NEGATIVE mg/dL   Hgb urine dipstick NEGATIVE NEGATIVE   Bilirubin Urine NEGATIVE NEGATIVE   Ketones, ur NEGATIVE NEGATIVE mg/dL   Protein, ur NEGATIVE NEGATIVE mg/dL   Urobilinogen, UA 0.2 0.0 - 1.0 mg/dL   Nitrite NEGATIVE NEGATIVE   Leukocytes, UA NEGATIVE NEGATIVE  Pregnancy, urine  Result Value Ref Range   Preg Test, Ur NEGATIVE NEGATIVE    Ct Abdomen Pelvis W Contrast  10/08/2014   CLINICAL DATA:  Right upper quadrant abdominal pain with nausea and vomiting for 8 days. Patient says she vomits when she eats.  EXAM: CT ABDOMEN AND PELVIS WITH CONTRAST  TECHNIQUE: Multidetector CT imaging of the abdomen and pelvis was performed using the standard protocol following bolus administration of intravenous contrast.  CONTRAST:  OMNIPAQUE IOHEXOL 300 MG/ML  SOLN  COMPARISON:  10/03/2014.  FINDINGS: Clear lung bases.  Heart normal in size.  Liver, spleen, pancreas: Unremarkable. No bile duct dilation. Gallbladder surgically absent.  No adrenal masses.  Normal kidneys, ureters and bladder.  Uterus and adnexa are unremarkable.  No pathologically enlarged lymph nodes. No abnormal fluid collections.  Colon and small bowel are unremarkable.  Normal appendix.  Mild degenerative changes of the thoracic and lower lumbar spine.  IMPRESSION: 1. No acute findings. No findings to explain right upper quadrant pain and vomiting. 2. Status post cholecystectomy. 3. Mild degenerative changes of the visualized spine. 4. No other abnormalities.  Normal appendix visualized.   Electronically Signed   By: Amie Portland M.D.   On: 10/08/2014 00:37   Ct Renal Stone Study  10/03/2014   CLINICAL DATA:  Right flank pain with emesis.  Nausea.  EXAM: CT ABDOMEN AND PELVIS WITHOUT CONTRAST  TECHNIQUE: Multidetector CT imaging of the abdomen and pelvis was performed following the standard protocol without IV contrast.  COMPARISON:  CT 05/07/2014  FINDINGS: The included lung bases are clear.  The kidneys are symmetric in size without stones or hydronephrosis. There is no perinephric stranding. Both ureters are decompressed without stones along the course. The urinary bladder is decompressed without stone.  The unenhanced liver, spleen, pancreas and adrenal glands are normal. The gallbladder is not identified. The appendix is normal.  Cecum and ascending colon are fluid-filled,  however no colonic wall thickening. Remainder the  colon is normal. No small bowel dilatation.  Uterus and adnexa are normal for age. Bilateral tubal ligation clips are seen. No pelvic free fluid.  Minimal degenerative change in the spine. No acute osseous abnormality. Mild osteitis pubis.  IMPRESSION: 1. No renal stones or obstructive uropathy. 2. Fluid-filled cecum and ascending colon, may reflect minimal colitis.   Electronically Signed   By: Rubye OaksMelanie  Ehinger M.D.   On: 10/03/2014 01:03     Imaging Review No results found.   EKG Interpretation None      MDM   Final diagnoses:  Abdominal pain  Epigastric pain  Nausea and vomiting, vomiting of unspecified type    Patient with epigastric abdominal pain, nausea, vomiting 1-2 weeks.  She has been seen 3 times for this in the emergency department. She's had multiple imaging tests which are negative. Patient is tolerating oral intake in the emergency department. She has a soft nontender abdomen. She has reassuring labs. She is nontoxic-appearing. Patient seen by and discussed with Dr. Romeo AppleHarrison, who agrees patient is stable or discharge. Will discharge with Carafate and Nexium. Recommend gastroenterology follow-up. Patient understands and agrees with the plan. Patient offered observation admission for persistent nausea and vomiting and her third visit, however she states that would like to go home now.     Roxy Horsemanobert Akane Tessier, PA-C 10/08/14 [phone]  Purvis SheffieldForrest Harrison, MD 10/08/14 [phone]

## 2014-10-08 ENCOUNTER — Emergency Department (HOSPITAL_COMMUNITY): Payer: 59

## 2014-10-08 ENCOUNTER — Encounter (HOSPITAL_COMMUNITY): Payer: Self-pay | Admitting: Radiology

## 2014-10-08 MED ORDER — ESOMEPRAZOLE MAGNESIUM 40 MG PO CPDR: 40.0000 mg | DELAYED_RELEASE_CAPSULE | Freq: Every day | ORAL | Status: DC

## 2014-10-08 MED ORDER — PROMETHAZINE HCL 25 MG PO TABS
25.0000 mg | ORAL_TABLET | Freq: Once | ORAL | Status: AC
Start: 2014-10-08 — End: 2014-10-08
  Administered 2014-10-08: 25 mg via ORAL
  Filled 2014-10-08: qty 1

## 2014-10-08 MED ORDER — SUCRALFATE 1 GM/10ML PO SUSP: 1.0000 g | Freq: Three times a day (TID) | ORAL | Status: DC

## 2014-10-08 MED ORDER — IOHEXOL 300 MG/ML  SOLN
100.0000 mL | Freq: Once | INTRAMUSCULAR | Status: AC | PRN
  Administered 2014-10-08: 100 mL via INTRAVENOUS

## 2014-10-08 MED ORDER — OXYCODONE-ACETAMINOPHEN 5-325 MG PO TABS
1.0000 | ORAL_TABLET | Freq: Once | ORAL | Status: AC
  Administered 2014-10-08: 1 via ORAL
  Filled 2014-10-08: qty 1

## 2014-10-08 NOTE — Discharge Instructions (Signed)
Abdominal Pain °Many things can cause abdominal pain. Usually, abdominal pain is not caused by a disease and will improve without treatment. It can often be observed and treated at home. Your health care provider will do a physical exam and possibly order blood tests and X-rays to help determine the seriousness of your pain. However, in many cases, more time must pass before a clear cause of the pain can be found. Before that point, your health care provider may not know if you need more testing or further treatment. °HOME CARE INSTRUCTIONS  °Monitor your abdominal pain for any changes. The following actions may help to alleviate any discomfort you are experiencing: °· Only take over-the-counter or prescription medicines as directed by your health care provider. °· Do not take laxatives unless directed to do so by your health care provider. °· Try a clear liquid diet (broth, tea, or water) as directed by your health care provider. Slowly move to a bland diet as tolerated. °SEEK MEDICAL CARE IF: °· You have unexplained abdominal pain. °· You have abdominal pain associated with nausea or diarrhea. °· You have pain when you urinate or have a bowel movement. °· You experience abdominal pain that wakes you in the night. °· You have abdominal pain that is worsened or improved by eating food. °· You have abdominal pain that is worsened with eating fatty foods. °· You have a fever. °SEEK IMMEDIATE MEDICAL CARE IF:  °· Your pain does not go away within 2 hours. °· You keep throwing up (vomiting). °· Your pain is felt only in portions of the abdomen, such as the right side or the left lower portion of the abdomen. °· You pass bloody or black tarry stools. °MAKE SURE YOU: °· Understand these instructions.   °· Will watch your condition.   °· Will get help right away if you are not doing well or get worse.   °Document Released: 06/16/2005 Document Revised: 09/11/2013 Document Reviewed: 05/16/2013 °[employer]® Patient Information  ©2015 [employer]. This information is not intended to replace advice given to you by your health care provider. Make sure you discuss any questions you have with your health care provider. ° °Nausea and Vomiting °Nausea is a sick feeling that often comes before throwing up (vomiting). Vomiting is a reflex where stomach contents come out of your mouth. Vomiting can cause severe loss of body fluids (dehydration). Children and elderly adults can become dehydrated quickly, especially if they also have diarrhea. Nausea and vomiting are symptoms of a condition or disease. It is important to find the cause of your symptoms. °CAUSES  °· Direct irritation of the stomach lining. This irritation can result from increased acid production (gastroesophageal reflux disease), infection, food poisoning, taking certain medicines (such as nonsteroidal anti-inflammatory drugs), alcohol use, or tobacco use. °· Signals from the brain. These signals could be caused by a headache, heat exposure, an inner ear disturbance, increased pressure in the brain from injury, infection, a tumor, or a concussion, pain, emotional stimulus, or metabolic problems. °· An obstruction in the gastrointestinal tract (bowel obstruction). °· Illnesses such as diabetes, hepatitis, gallbladder problems, appendicitis, kidney problems, cancer, sepsis, atypical symptoms of a heart attack, or eating disorders. °· Medical treatments such as chemotherapy and radiation. °· Receiving medicine that makes you sleep (general anesthetic) during surgery. °DIAGNOSIS °Your caregiver may ask for tests to be done if the problems do not improve after a few days. Tests may also be done if symptoms are severe or if the reason for the nausea   and vomiting is not clear. Tests may include: °· Urine tests. °· Blood tests. °· Stool tests. °· Cultures (to look for evidence of infection). °· X-rays or other imaging studies. °Test results can help your caregiver make decisions about  treatment or the need for additional tests. °TREATMENT °You need to stay well hydrated. Drink frequently but in small amounts. You may wish to drink water, sports drinks, clear broth, or eat frozen ice pops or gelatin dessert to help stay hydrated. When you eat, eating slowly may help prevent nausea. There are also some antinausea medicines that may help prevent nausea. °HOME CARE INSTRUCTIONS  °· Take all medicine as directed by your caregiver. °· If you do not have an appetite, do not force yourself to eat. However, you must continue to drink fluids. °· If you have an appetite, eat a normal diet unless your caregiver tells you differently. °¨ Eat a variety of complex carbohydrates (rice, wheat, potatoes, bread), lean meats, yogurt, fruits, and vegetables. °¨ Avoid high-fat foods because they are more difficult to digest. °· Drink enough water and fluids to keep your urine clear or pale yellow. °· If you are dehydrated, ask your caregiver for specific rehydration instructions. Signs of dehydration may include: °¨ Severe thirst. °¨ Dry lips and mouth. °¨ Dizziness. °¨ Dark urine. °¨ Decreasing urine frequency and amount. °¨ Confusion. °¨ Rapid breathing or pulse. °SEEK IMMEDIATE MEDICAL CARE IF:  °· You have blood or brown flecks (like coffee grounds) in your vomit. °· You have black or bloody stools. °· You have a severe headache or stiff neck. °· You are confused. °· You have severe abdominal pain. °· You have chest pain or trouble breathing. °· You do not urinate at least once every 8 hours. °· You develop cold or clammy skin. °· You continue to vomit for longer than 24 to 48 hours. °· You have a fever. °MAKE SURE YOU:  °· Understand these instructions. °· Will watch your condition. °· Will get help right away if you are not doing well or get worse. °Document Released: 09/06/2005 Document Revised: 11/29/2011 Document Reviewed: 02/03/2011 °[employer]® Patient Information ©2015 [employer]. This information is not  intended to replace advice given to you by your health care provider. Make sure you discuss any questions you have with your health care provider. ° °

## 2014-12-26 ENCOUNTER — Encounter (HOSPITAL_COMMUNITY): Payer: Self-pay

## 2014-12-26 ENCOUNTER — Other Ambulatory Visit: Payer: Self-pay

## 2014-12-26 ENCOUNTER — Emergency Department (HOSPITAL_COMMUNITY)
Admission: EM | Admit: 2014-12-26 | Discharge: 2014-12-26 | Disposition: A | Discharge status: Home / Self Care | Payer: 59 | Attending: Emergency Medicine | Admitting: Emergency Medicine

## 2014-12-26 ENCOUNTER — Emergency Department (HOSPITAL_COMMUNITY): Payer: 59

## 2014-12-26 DIAGNOSIS — G43909 Migraine, unspecified, not intractable, without status migrainosus: Secondary | ICD-10-CM | POA: Insufficient documentation

## 2014-12-26 DIAGNOSIS — K219 Gastro-esophageal reflux disease without esophagitis: Secondary | ICD-10-CM | POA: Insufficient documentation

## 2014-12-26 DIAGNOSIS — E119 Type 2 diabetes mellitus without complications: Secondary | ICD-10-CM | POA: Insufficient documentation

## 2014-12-26 DIAGNOSIS — Z9049 Acquired absence of other specified parts of digestive tract: Secondary | ICD-10-CM | POA: Insufficient documentation

## 2014-12-26 DIAGNOSIS — R079 Chest pain, unspecified: Secondary | ICD-10-CM | POA: Insufficient documentation

## 2014-12-26 DIAGNOSIS — F419 Anxiety disorder, unspecified: Secondary | ICD-10-CM | POA: Insufficient documentation

## 2014-12-26 DIAGNOSIS — J45909 Unspecified asthma, uncomplicated: Secondary | ICD-10-CM | POA: Insufficient documentation

## 2014-12-26 DIAGNOSIS — I10 Essential (primary) hypertension: Secondary | ICD-10-CM | POA: Insufficient documentation

## 2014-12-26 DIAGNOSIS — K2970 Gastritis, unspecified, without bleeding: Secondary | ICD-10-CM

## 2014-12-26 DIAGNOSIS — F329 Major depressive disorder, single episode, unspecified: Secondary | ICD-10-CM | POA: Insufficient documentation

## 2014-12-26 DIAGNOSIS — Z720 Tobacco use: Secondary | ICD-10-CM | POA: Insufficient documentation

## 2014-12-26 LAB — CBC
HEMATOCRIT: 40.3 % (ref 36.0–46.0)
Hemoglobin: 12.9 g/dL (ref 12.0–15.0)
MCH: 28.4 pg (ref 26.0–34.0)
MCHC: 32 g/dL (ref 30.0–36.0)
MCV: 88.8 fL (ref 78.0–100.0)
Platelets: 256 10*3/uL (ref 150–400)
RBC: 4.54 MIL/uL (ref 3.87–5.11)
RDW: 14.7 % (ref 11.5–15.5)
WBC: 8.8 10*3/uL (ref 4.0–10.5)

## 2014-12-26 LAB — BASIC METABOLIC PANEL
ANION GAP: 9 (ref 5–15)
BUN: 14 mg/dL (ref 6–23)
CHLORIDE: 107 mmol/L (ref 96–112)
CO2: 23 mmol/L (ref 19–32)
Calcium: 9.5 mg/dL (ref 8.4–10.5)
Creatinine, Ser: 1.07 mg/dL (ref 0.50–1.10)
GFR calc Af Amer: 74 mL/min — ABNORMAL LOW (ref >90)
GFR calc non Af Amer: 64 mL/min — ABNORMAL LOW (ref >90)
Glucose, Bld: 130 mg/dL — ABNORMAL HIGH (ref 70–99)
POTASSIUM: 4.1 mmol/L (ref 3.5–5.1)
Sodium: 139 mmol/L (ref 135–145)

## 2014-12-26 LAB — TROPONIN I

## 2014-12-26 MED ORDER — GI COCKTAIL ~~LOC~~
30.0000 mL | Freq: Once | ORAL | Status: AC
  Administered 2014-12-26: 30 mL via ORAL
  Filled 2014-12-26: qty 30

## 2014-12-26 MED ORDER — ACETAMINOPHEN 325 MG PO TABS
650.0000 mg | ORAL_TABLET | Freq: Once | ORAL | Status: AC
  Administered 2014-12-26: 650 mg via ORAL
  Filled 2014-12-26: qty 2

## 2014-12-26 NOTE — Discharge Instructions (Signed)
Your symptoms are most likely related to a test a gastrointestinal disturbance. Restart your Carafate medication. Also, take Maalox or Mylanta before meals and at bedtime to help control the symptoms.    Chest Pain (Nonspecific) It is often hard to give a specific diagnosis for the cause of chest pain. There is always a chance that your pain could be related to something serious, such as a heart attack or a blood clot in the lungs. You need to follow up with your health care provider for further evaluation. CAUSES   Heartburn.  Pneumonia or bronchitis.  Anxiety or stress.  Inflammation around your heart (pericarditis) or lung (pleuritis or pleurisy).  A blood clot in the lung.  A collapsed lung (pneumothorax). It can develop suddenly on its own (spontaneous pneumothorax) or from trauma to the chest.  Shingles infection (herpes zoster virus). The chest wall is composed of bones, muscles, and cartilage. Any of these can be the source of the pain.  The bones can be bruised by injury.  The muscles or cartilage can be strained by coughing or overwork.  The cartilage can be affected by inflammation and become sore (costochondritis). DIAGNOSIS  Lab tests or other studies may be needed to find the cause of your pain. Your health care provider may have you take a test called an ambulatory electrocardiogram (ECG). An ECG records your heartbeat patterns over a 24-hour period. You may also have other tests, such as:  Transthoracic echocardiogram (TTE). During echocardiography, sound waves are used to evaluate how blood flows through your heart.  Transesophageal echocardiogram (TEE).  Cardiac monitoring. This allows your health care provider to monitor your heart rate and rhythm in real time.  Holter monitor. This is a portable device that records your heartbeat and can help diagnose heart arrhythmias. It allows your health care provider to track your heart activity for several days, if  needed.  Stress tests by exercise or by giving medicine that makes the heart beat faster. TREATMENT   Treatment depends on what may be causing your chest pain. Treatment may include:  Acid blockers for heartburn.  Anti-inflammatory medicine.  Pain medicine for inflammatory conditions.  Antibiotics if an infection is present.  You may be advised to change lifestyle habits. This includes stopping smoking and avoiding alcohol, caffeine, and chocolate.  You may be advised to keep your head raised (elevated) when sleeping. This reduces the chance of acid going backward from your stomach into your esophagus. Most of the time, nonspecific chest pain will improve within 2-3 days with rest and mild pain medicine.  HOME CARE INSTRUCTIONS   If antibiotics were prescribed, take them as directed. Finish them even if you start to feel better.  For the next few days, avoid physical activities that bring on chest pain. Continue physical activities as directed.  Do not use any tobacco products, including cigarettes, chewing tobacco, or electronic cigarettes.  Avoid drinking alcohol.  Only take medicine as directed by your health care provider.  Follow your health care provider's suggestions for further testing if your chest pain does not go away.  Keep any follow-up appointments you made. If you do not go to an appointment, you could develop lasting (chronic) problems with pain. If there is any problem keeping an appointment, call to reschedule. SEEK MEDICAL CARE IF:   Your chest pain does not go away, even after treatment.  You have a rash with blisters on your chest.  You have a fever. SEEK IMMEDIATE MEDICAL CARE IF:  You have increased chest pain or pain that spreads to your arm, neck, jaw, back, or abdomen.  You have shortness of breath.  You have an increasing cough, or you cough up blood.  You have severe back or abdominal pain.  You feel nauseous or vomit.  You have severe  weakness.  You faint.  You have chills. This is an emergency. Do not wait to see if the pain will go away. Get medical help at once. Call your local emergency services (911 in [place] Do not drive yourself to the hospital. MAKE SURE YOU:   Understand these instructions.  Will watch your condition.  Will get help right away if you are not doing well or get worse. Document Released: 06/16/2005 Document Revised: 09/11/2013 Document Reviewed: 04/11/2008 [facility] Patient Information 2015 [place], [place]. This information is not intended to replace advice given to you by your health care provider. Make sure you discuss any questions you have with your health care provider.  Gastritis, Adult Gastritis is soreness and swelling (inflammation) of the lining of the stomach. Gastritis can develop as a sudden onset (acute) or long-term (chronic) condition. If gastritis is not treated, it can lead to stomach bleeding and ulcers. CAUSES  Gastritis occurs when the stomach lining is weak or damaged. Digestive juices from the stomach then inflame the weakened stomach lining. The stomach lining may be weak or damaged due to viral or bacterial infections. One common bacterial infection is the Helicobacter pylori infection. Gastritis can also result from excessive alcohol consumption, taking certain medicines, or having too much acid in the stomach.  SYMPTOMS  In some cases, there are no symptoms. When symptoms are present, they may include:  Pain or a burning sensation in the upper abdomen.  Nausea.  Vomiting.  An uncomfortable feeling of fullness after eating. DIAGNOSIS  Your caregiver may suspect you have gastritis based on your symptoms and a physical exam. To determine the cause of your gastritis, your caregiver may perform the following:  Blood or stool tests to check for the H pylori bacterium.  Gastroscopy. A thin, flexible tube (endoscope) is passed down the esophagus and into the stomach. The  endoscope has a light and camera on the end. Your caregiver uses the endoscope to view the inside of the stomach.  Taking a tissue sample (biopsy) from the stomach to examine under a microscope. TREATMENT  Depending on the cause of your gastritis, medicines may be prescribed. If you have a bacterial infection, such as an H pylori infection, antibiotics may be given. If your gastritis is caused by too much acid in the stomach, H2 blockers or antacids may be given. Your caregiver may recommend that you stop taking aspirin, ibuprofen, or other nonsteroidal anti-inflammatory drugs (NSAIDs). HOME CARE INSTRUCTIONS  Only take over-the-counter or prescription medicines as directed by your caregiver.  If you were given antibiotic medicines, take them as directed. Finish them even if you start to feel better.  Drink enough fluids to keep your urine clear or pale yellow.  Avoid foods and drinks that make your symptoms worse, such as:  Caffeine or alcoholic drinks.  Chocolate.  Peppermint or mint flavorings.  Garlic and onions.  Spicy foods.  Citrus fruits, such as oranges, lemons, or limes.  Tomato-based foods such as sauce, chili, salsa, and pizza.  Fried and fatty foods.  Eat small, frequent meals instead of large meals. SEEK IMMEDIATE MEDICAL CARE IF:   You have black or dark red stools.  You vomit blood  or material that looks like coffee grounds.  You are unable to keep fluids down.  Your abdominal pain gets worse.  You have a fever.  You do not feel better after 1 week.  You have any other questions or concerns. MAKE SURE YOU:  Understand these instructions.  Will watch your condition.  Will get help right away if you are not doing well or get worse. Document Released: 08/31/2001 Document Revised: 03/07/2012 Document Reviewed: 10/20/2011 [facility] Patient Information 2015 [place]. This information is not intended to replace advice given to you by your health  care provider. Make sure you discuss any questions you have with your health care provider.

## 2014-12-26 NOTE — ED Provider Notes (Signed)
CSN: [id number]     Arrival date & time 12/26/14  1256 History   First MD Initiated Contact with Patient 12/26/14 1441     Chief Complaint  Patient presents with  . Chest Pain     (Consider location/radiation/quality/duration/timing/severity/associated sxs/prior Treatment) HPI   [name] is a 39 y.o. female who presents for evaluation of chest pain, present for 2 days, which relates to her neck, shoulder, jaw and upper back. The pain is intermittent, and does not have any apparent cause. There is no associated diaphoresis, nausea, vomiting, weakness or dizziness. She's never had this previously. She does have a history of hiatal hernia, and has had endoscopy several years ago. There is no history of cardiac disease. There are no other known modifying factors.   Past Medical History  Diagnosis Date  . Hypertension   . Hyperlipidemia   . GERD (gastroesophageal reflux disease)   . Asthma     no inhaler  . Depression   . Diabetes mellitus     on insulin during this pregnancy  . Migraine headache    Past Surgical History  Procedure Laterality Date  . Cholecystectomy    . Wisdom tooth extraction    . Cesarean section with bilateral tubal ligation  07/18/2012    Procedure: CESAREAN SECTION WITH BILATERAL TUBAL LIGATION;  Surgeon: Mickel Baas, MD;  Location: [facility];  Service: Obstetrics;  Laterality: Bilateral;   Family History  Problem Relation Age of Onset  . Heart disease Mother   . COPD Mother   . Hypertension Mother   . Hyperlipidemia Mother   . Hearing loss Maternal Grandmother    History  Substance Use Topics  . Smoking status: Current Every Day Smoker -- 0.25 packs/day  . Smokeless tobacco: Never Used  . Alcohol Use: No   OB History    Gravida Para Term Preterm AB TAB SAB Ectopic Multiple Living   0 1 0 1 0 0 2     Review of Systems  All other systems reviewed and are negative.     Allergies  Imitrex; Levofloxacin; Shellfish allergy; Sulfa  antibiotics; Tequin; and Betadine  Home Medications   Prior to Admission medications   Medication Sig Start Date End Date Taking? Authorizing Provider  buPROPion (WELLBUTRIN SR) 150 MG 12 hr tablet Take 150 mg by mouth 2 (two) times daily.   Yes Historical Provider, MD  EPINEPHrine 0.3 mg/0.3 mL IJ SOAJ injection Inject 0.3 mg into the muscle. 04/09/13  Yes Historical Provider, MD  esomeprazole (NEXIUM) 40 MG capsule Take 1 capsule (40 mg total) by mouth daily. Patient taking differently: Take 40 mg by mouth 2 (two) times daily.  10/08/14  Yes Roxy Horseman, PA-C  FLUoxetine (PROZAC) 20 MG capsule Take 20 mg by mouth daily. 11/27/14 11/27/15 Yes Historical Provider, MD  fluticasone Aleda Grana) 50 MCG/ACT nasal spray Use 1 squirt to each nostril daily as needed for sinus symptoms 04/03/14  Yes Historical Provider, MD  ibuprofen (ADVIL,MOTRIN) 200 MG tablet Take 800 mg by mouth every 8 (eight) hours as needed. pain   Yes Historical Provider, MD  lisinopril (PRINIVIL,ZESTRIL) 20 MG tablet Take 20 mg by mouth daily.   Yes Historical Provider, MD  metFORMIN (GLUCOPHAGE) 500 MG tablet Take 500 mg by mouth 2 (two) times daily with a meal.   Yes Historical Provider, MD  topiramate (TOPAMAX) 200 MG tablet Take 200 mg by mouth 2 (two) times daily.   Yes Historical Provider, MD  HYDROcodone-acetaminophen (NORCO/VICODIN) 5-325 MG per tablet Take 1-2 tablets by mouth every 4 (four) hours as needed. Patient not taking: Reported on 12/26/2014 10/03/14   Elpidio AnisShari Upstill, PA-C  nitrofurantoin, macrocrystal-monohydrate, (MACROBID) 100 MG capsule Take 1 capsule (100 mg total) by mouth 2 (two) times daily. Patient not taking: Reported on 12/26/2014 10/01/14   Loren Raceravid Yelverton, MD  oxyCODONE-acetaminophen (PERCOCET/ROXICET) 5-325 MG per tablet Take 1 tablet by mouth every 6 (six) hours as needed. Patient not taking: Reported on 10/01/2014 05/07/14   Bethann BerkshireJoseph Zammit, MD  promethazine (PHENERGAN) 25 MG tablet Take 1 tablet (25 mg total)  by mouth every 6 (six) hours as needed for nausea. 11/08/11 10/07/14  Samuel JesterKathleen McManus, DO   BP 118/76 mmHg  Pulse 74  Temp(Src) 97.9 F (36.6 C) (Oral)  Resp 18  Ht 5\' 9"  (1.753 m)  Wt 228 lb (103.42 kg)  BMI 33.65 kg/m2  SpO2 100%  LMP 12/18/2014 Physical Exam  Constitutional: She is oriented to person, place, and time. She appears well-developed and well-nourished.  HENT:  Head: Normocephalic and atraumatic.  Right Ear: External ear normal.  Left Ear: External ear normal.  Eyes: Conjunctivae and EOM are normal. Pupils are equal, round, and reactive to light.  Neck: Normal range of motion and phonation normal. Neck supple.  Cardiovascular: Normal rate, regular rhythm and normal heart sounds.   Pulmonary/Chest: Effort normal and breath sounds normal. She exhibits no bony tenderness.  Abdominal: Soft. There is no tenderness.  Musculoskeletal: Normal range of motion.  Neurological: She is alert and oriented to person, place, and time. No cranial nerve deficit or sensory deficit. She exhibits normal muscle tone. Coordination normal.  Skin: Skin is warm, dry and intact.  Psychiatric: Her behavior is normal. Judgment and thought content normal.  She is anxious  Nursing note and vitals reviewed.   ED Course  Procedures (including critical care time) Medications  acetaminophen (TYLENOL) tablet 650 mg (650 mg Oral Given 12/26/14 1525)  gi cocktail (Maalox,Lidocaine,Donnatal) (30 mLs Oral Given 12/26/14 1613)    Patient Vitals for the past 24 hrs:  BP Temp Temp src Pulse Resp SpO2 Height Weight  12/26/14 1830 118/76 mmHg - - 74 18 100 % - -  12/26/14 1800 131/80 mmHg - - 81 13 99 % - -  12/26/14 1730 119/79 mmHg - - 71 18 99 % - -  12/26/14 1657 114/76 mmHg 97.9 F (36.6 C) Oral 77 18 99 % - -  12/26/14 1600 129/70 mmHg - - 89 - 100 % - -  12/26/14 1530 117/71 mmHg - - 77 - 100 % - -  12/26/14 1502 112/58 mmHg - - 84 18 96 % - -  12/26/14 1315 126/79 mmHg 98.1 F (36.7 C) Oral 87  16 100 % 5\' 9"  (1.753 m) 228 lb (103.42 kg)    6:09 PM Reevaluation with update and discussion. After initial assessment and treatment, an updated evaluation reveals clinical status unchanged. She continues to have occasional episodes of left sided chest discomfort. She reports being on Carafate in the past for similar symptoms.Mancel Bale. Shakiyla Kook L  Heart score 3- calculated by [facility]  Labs Review Labs Reviewed  BASIC METABOLIC PANEL - Abnormal; Notable for the following:    Glucose, Bld 130 (*)    GFR calc non Af Amer 64 (*)    GFR calc Af Amer 74 (*)    All other components within normal limits  CBC  TROPONIN I    Imaging Review Dg Chest 2  View  12/26/2014   CLINICAL DATA:  Left side chest pain, jaw pain, intermittent shoulder blade pain for 2 days  EXAM: CHEST  2 VIEW  COMPARISON:  12/02/2009  FINDINGS: Cardiomediastinal silhouette is stable. No acute infiltrate or pleural effusion. No pulmonary edema. Mild degenerative changes lower thoracic spine.  IMPRESSION: No active cardiopulmonary disease.   Electronically Signed   By: Natasha Mead M.D.   On: 12/26/2014 14:19     EKG Interpretation   Date/Time:  Thursday December 26 2014 13:05:56 EDT Ventricular Rate:  87 PR Interval:  134 QRS Duration: 80 QT Interval:  368 QTC Calculation: 442 R Axis:   54 Text Interpretation:  Normal sinus rhythm Normal ECG since last tracing no  significant change Confirmed by Effie Shy  MD, Ajmal Kathan ([id number]) on 12/26/2014  3:14:05 PM      MDM   Final diagnoses:  Nonspecific chest pain  Gastritis    Nonspecific chest pain, most likely GI related. Doubt ACS, PE or pneumonia.  Nursing Notes Reviewed/ Care Coordinated Applicable Imaging Reviewed Interpretation of Laboratory Data incorporated into ED treatment  The patient appears reasonably screened and/or stabilized for discharge and I doubt any other medical condition or other [facility] requiring further screening, evaluation, or treatment in the ED at  this time prior to discharge.  Plan: Home Medications- restart Carafate, use antacid; Home Treatments- rest; return here if the recommended treatment, does not improve the symptoms; Recommended follow up- PCP and GI 1-2 weeks     Mancel Bale, MD 12/26/14 2208

## 2014-12-26 NOTE — ED Notes (Signed)
Patient presents to registration c/o of chest pain times 2 days, with radiation into her neck, shoulder and upper back. No aggravating or relieving factors. Patients states she feels some of it is "nerves" or maybe " a pulled muscle from lifting her 50lb two year". NAD. Triage nurse informed.

## 2014-12-26 NOTE — ED Notes (Signed)
EKG completed, given to Dr. Effie ShyWentz

## 2015-01-05 ENCOUNTER — Encounter (HOSPITAL_COMMUNITY): Payer: Self-pay | Admitting: Emergency Medicine

## 2015-01-05 ENCOUNTER — Emergency Department (HOSPITAL_COMMUNITY)
Admission: EM | Admit: 2015-01-05 | Discharge: 2015-01-05 | Disposition: A | Discharge status: Home / Self Care | Payer: Self-pay | Attending: Emergency Medicine | Admitting: Emergency Medicine

## 2015-01-05 DIAGNOSIS — J45909 Unspecified asthma, uncomplicated: Secondary | ICD-10-CM | POA: Insufficient documentation

## 2015-01-05 DIAGNOSIS — K088 Other specified disorders of teeth and supporting structures: Secondary | ICD-10-CM | POA: Insufficient documentation

## 2015-01-05 DIAGNOSIS — I10 Essential (primary) hypertension: Secondary | ICD-10-CM | POA: Insufficient documentation

## 2015-01-05 DIAGNOSIS — Z7951 Long term (current) use of inhaled steroids: Secondary | ICD-10-CM | POA: Insufficient documentation

## 2015-01-05 DIAGNOSIS — Z79899 Other long term (current) drug therapy: Secondary | ICD-10-CM | POA: Insufficient documentation

## 2015-01-05 DIAGNOSIS — F329 Major depressive disorder, single episode, unspecified: Secondary | ICD-10-CM | POA: Insufficient documentation

## 2015-01-05 DIAGNOSIS — G43909 Migraine, unspecified, not intractable, without status migrainosus: Secondary | ICD-10-CM | POA: Insufficient documentation

## 2015-01-05 DIAGNOSIS — K0889 Other specified disorders of teeth and supporting structures: Secondary | ICD-10-CM

## 2015-01-05 DIAGNOSIS — Z720 Tobacco use: Secondary | ICD-10-CM | POA: Insufficient documentation

## 2015-01-05 DIAGNOSIS — G8929 Other chronic pain: Secondary | ICD-10-CM | POA: Insufficient documentation

## 2015-01-05 DIAGNOSIS — E119 Type 2 diabetes mellitus without complications: Secondary | ICD-10-CM | POA: Insufficient documentation

## 2015-01-05 DIAGNOSIS — K219 Gastro-esophageal reflux disease without esophagitis: Secondary | ICD-10-CM | POA: Insufficient documentation

## 2015-01-05 HISTORY — DX: Other chronic pain: G89.29

## 2015-01-05 HISTORY — DX: Unspecified abdominal pain: R10.9

## 2015-01-05 MED ORDER — PENICILLIN V POTASSIUM 250 MG PO TABS
500.0000 mg | ORAL_TABLET | Freq: Four times a day (QID) | ORAL | Status: DC
  Administered 2015-01-05: 500 mg via ORAL
  Filled 2015-01-05: qty 2

## 2015-01-05 MED ORDER — HYDROCODONE-ACETAMINOPHEN 5-325 MG PO TABS
1.0000 | ORAL_TABLET | Freq: Once | ORAL | Status: AC
  Administered 2015-01-05: 1 via ORAL
  Filled 2015-01-05: qty 1

## 2015-01-05 MED ORDER — PENICILLIN V POTASSIUM 500 MG PO TABS: 500.0000 mg | ORAL_TABLET | Freq: Three times a day (TID) | ORAL | Status: DC

## 2015-01-05 NOTE — Discharge Instructions (Signed)
Dental Pain °A tooth ache may be caused by cavities (tooth decay). Cavities expose the nerve of the tooth to air and hot or cold temperatures. It may come from an infection or abscess (also called a boil or furuncle) around your tooth. It is also often caused by dental caries (tooth decay). This causes the pain you are having. °DIAGNOSIS  °Your caregiver can diagnose this problem by exam. °TREATMENT  °· If caused by an infection, it may be treated with medications which kill germs (antibiotics) and pain medications as prescribed by your caregiver. Take medications as directed. °· Only take over-the-counter or prescription medicines for pain, discomfort, or fever as directed by your caregiver. °· Whether the tooth ache today is caused by infection or dental disease, you should see your dentist as soon as possible for further care. °SEEK MEDICAL CARE IF: °The exam and treatment you received today has been provided on an emergency basis only. This is not a substitute for complete medical or dental care. If your problem worsens or new problems (symptoms) appear, and you are unable to meet with your dentist, call or return to this location. °SEEK IMMEDIATE MEDICAL CARE IF:  °· You have a fever. °· You develop redness and swelling of your face, jaw, or neck. °· You are unable to open your mouth. °· You have severe pain uncontrolled by pain medicine. °MAKE SURE YOU:  °· Understand these instructions. °· Will watch your condition. °· Will get help right away if you are not doing well or get worse. °Document Released: 09/06/2005 Document Revised: 11/29/2011 Document Reviewed: 04/24/2008 °ExitCare® Patient Information ©2015 [employer]. This information is not intended to replace advice given to you by your health care provider. Make sure you discuss any questions you have with your health care provider. ° °Dental Care and Dentist Visits °Dental care supports good overall health. Regular dental visits can also help you  avoid dental pain, bleeding, infection, and other more serious health problems in the future. It is important to keep the mouth healthy because diseases in the teeth, gums, and other oral tissues can spread to other areas of the body. Some problems, such as diabetes, heart disease, and pre-term labor have been associated with poor oral health.  °See your dentist every 6 months. If you experience emergency problems such as a toothache or broken tooth, go to the dentist right away. If you see your dentist regularly, you may catch problems early. It is easier to be treated for problems in the early stages.  °WHAT TO EXPECT AT A DENTIST VISIT  °Your dentist will look for many common oral health problems and recommend proper treatment. At your regular dental visit, you can expect: °· Gentle cleaning of the teeth and gums. This includes scraping and polishing. This helps to remove the sticky substance around the teeth and gums (plaque). Plaque forms in the mouth shortly after eating. Over time, plaque hardens on the teeth as tartar. If tartar is not removed regularly, it can cause problems. Cleaning also helps remove stains. °· Periodic X-rays. These pictures of the teeth and supporting bone will help your dentist assess the health of your teeth. °· Periodic fluoride treatments. Fluoride is a natural mineral shown to help strengthen teeth. Fluoride treatment involves applying a fluoride gel or varnish to the teeth. It is most commonly done in children. °· Examination of the mouth, tongue, jaws, teeth, and gums to look for any oral health problems, such as: °¨ Cavities (dental caries). This is   decay on the tooth caused by plaque, sugar, and acid in the mouth. It is best to catch a cavity when it is small. °¨ Inflammation of the gums caused by plaque buildup (gingivitis). °¨ Problems with the mouth or malformed or misaligned teeth. °¨ Oral cancer or other diseases of the soft tissues or jaws.  °KEEP YOUR TEETH AND GUMS  HEALTHY °For healthy teeth and gums, follow these general guidelines as well as your dentist's specific advice: °· Have your teeth professionally cleaned at the dentist every 6 months. °· Brush twice daily with a fluoride toothpaste. °· Floss your teeth daily.  °· Ask your dentist if you need fluoride supplements, treatments, or fluoride toothpaste. °· Eat a healthy diet. Reduce foods and drinks with added sugar. °· Avoid smoking. °TREATMENT FOR ORAL HEALTH PROBLEMS °If you have oral health problems, treatment varies depending on the conditions present in your teeth and gums. °· Your caregiver will most likely recommend good oral hygiene at each visit. °· For cavities, gingivitis, or other oral health disease, your caregiver will perform a procedure to treat the problem. This is typically done at a separate appointment. Sometimes your caregiver will refer you to another dental specialist for specific tooth problems or for surgery. °SEEK IMMEDIATE DENTAL CARE IF: °· You have pain, bleeding, or soreness in the gum, tooth, jaw, or mouth area. °· A permanent tooth becomes loose or separated from the gum socket. °· You experience a blow or injury to the mouth or jaw area. °Document Released: 05/19/2011 Document Revised: 11/29/2011 Document Reviewed: 05/19/2011 °ExitCare® Patient Information ©2015 [employer]. This information is not intended to replace advice given to you by your health care provider. Make sure you discuss any questions you have with your health care provider. ° °

## 2015-01-05 NOTE — ED Notes (Signed)
Pt reports left side tooth pain with lip swelling. Pt denies having a dentist at this time

## 2015-01-05 NOTE — ED Provider Notes (Signed)
CSN: [id number]     Arrival date & time 01/05/15  1202 History   First MD Initiated Contact with Patient 01/05/15 1229     Chief Complaint  Patient presents with  . Dental Pain     (Consider location/radiation/quality/duration/timing/severity/associated sxs/prior Treatment) HPI  Past Medical History  Diagnosis Date  . Hypertension   . Hyperlipidemia   . GERD (gastroesophageal reflux disease)   . Asthma     no inhaler  . Depression   . Diabetes mellitus     on insulin during this pregnancy  . Migraine headache   . Chronic abdominal pain    Past Surgical History  Procedure Laterality Date  . Cholecystectomy    . Wisdom tooth extraction    . Cesarean section with bilateral tubal ligation  07/18/2012    Procedure: CESAREAN SECTION WITH BILATERAL TUBAL LIGATION;  Surgeon: Mickel Baas, MD;  Location: [facility];  Service: Obstetrics;  Laterality: Bilateral;   Family History  Problem Relation Age of Onset  . Heart disease Mother   . COPD Mother   . Hypertension Mother   . Hyperlipidemia Mother   . Hearing loss Maternal Grandmother    History  Substance Use Topics  . Smoking status: Current Every Day Smoker -- 0.25 packs/day  . Smokeless tobacco: Never Used  . Alcohol Use: No   OB History    Gravida Para Term Preterm AB TAB SAB Ectopic Multiple Living   0 1 0 1 0 0 2     Review of Systems    Allergies  Imitrex; Levofloxacin; Shellfish allergy; Sulfa antibiotics; Tequin; and Betadine  Home Medications   Prior to Admission medications   Medication Sig Start Date End Date Taking? Authorizing Provider  buPROPion (WELLBUTRIN SR) 150 MG 12 hr tablet Take 150 mg by mouth 2 (two) times daily.    Historical Provider, MD  EPINEPHrine 0.3 mg/0.3 mL IJ SOAJ injection Inject 0.3 mg into the muscle. 04/09/13   Historical Provider, MD  esomeprazole (NEXIUM) 40 MG capsule Take 1 capsule (40 mg total) by mouth daily. Patient taking differently: Take 40 mg by mouth 2  (two) times daily.  10/08/14   Roxy Horseman, PA-C  FLUoxetine (PROZAC) 20 MG capsule Take 20 mg by mouth daily. 11/27/14 11/27/15  Historical Provider, MD  fluticasone Aleda Grana) 50 MCG/ACT nasal spray Use 1 squirt to each nostril daily as needed for sinus symptoms 04/03/14   Historical Provider, MD  HYDROcodone-acetaminophen (NORCO/VICODIN) 5-325 MG per tablet Take 1-2 tablets by mouth every 4 (four) hours as needed. Patient not taking: Reported on 12/26/2014 10/03/14   Elpidio Anis, PA-C  ibuprofen (ADVIL,MOTRIN) 200 MG tablet Take 800 mg by mouth every 8 (eight) hours as needed. pain    Historical Provider, MD  lisinopril (PRINIVIL,ZESTRIL) 20 MG tablet Take 20 mg by mouth daily.    Historical Provider, MD  metFORMIN (GLUCOPHAGE) 500 MG tablet Take 500 mg by mouth 2 (two) times daily with a meal.    Historical Provider, MD  nitrofurantoin, macrocrystal-monohydrate, (MACROBID) 100 MG capsule Take 1 capsule (100 mg total) by mouth 2 (two) times daily. Patient not taking: Reported on 12/26/2014 10/01/14   Loren Racer, MD  oxyCODONE-acetaminophen (PERCOCET/ROXICET) 5-325 MG per tablet Take 1 tablet by mouth every 6 (six) hours as needed. Patient not taking: Reported on 10/01/2014 05/07/14   Bethann Berkshire, MD  promethazine (PHENERGAN) 25 MG tablet Take 1 tablet (25 mg total) by mouth every 6 (six) hours as needed for  nausea. 11/08/11 10/07/14  Samuel JesterKathleen McManus, DO  topiramate (TOPAMAX) 200 MG tablet Take 200 mg by mouth 2 (two) times daily.    Historical Provider, MD   BP 143/89 mmHg  Pulse 96  Temp(Src) 98.9 F (37.2 C) (Oral)  Resp 18  Wt 228 lb (103.42 kg)  SpO2 99%  LMP 12/18/2014 Physical Exam  Constitutional: She is oriented to person, place, and time. She appears well-developed and well-nourished.  HENT:  Mouth/Throat: Oropharynx is clear and moist.  Swelling surrounding #'s 7 and 8, without visual abscess with facial swelling. Submental adenopathy on the left.   Neck: Normal range of motion.  Neck supple.  Pulmonary/Chest: Effort normal.  Neurological: She is alert and oriented to person, place, and time.  Skin: Skin is warm and dry.    ED Course  Procedures (including critical care time) Labs Review Labs Reviewed - No data to display  Imaging Review No results found.   EKG Interpretation None      MDM   Final diagnoses:  None    1. Dental pain  Likely abscess given history of transient facial swelling and inflammed gingival tissues. Will treat with abx and encourage dental follow up..   I personally performed the services described in this documentation, which was scribed in my presence. The recorded information has been reviewed and is accurate.  Elpidio AnisShari Crissie Aloi, PA-C 01/05/15 1302  Samuel JesterKathleen McManus, DO 01/05/15 [phone]

## 2015-01-22 ENCOUNTER — Emergency Department (HOSPITAL_COMMUNITY)
Admission: EM | Admit: 2015-01-22 | Discharge: 2015-01-22 | Disposition: A | Discharge status: Home / Self Care | Payer: Self-pay | Attending: Emergency Medicine | Admitting: Emergency Medicine

## 2015-01-22 ENCOUNTER — Encounter (HOSPITAL_COMMUNITY): Payer: Self-pay | Admitting: Family Medicine

## 2015-01-22 DIAGNOSIS — Z79899 Other long term (current) drug therapy: Secondary | ICD-10-CM | POA: Insufficient documentation

## 2015-01-22 DIAGNOSIS — F329 Major depressive disorder, single episode, unspecified: Secondary | ICD-10-CM | POA: Insufficient documentation

## 2015-01-22 DIAGNOSIS — Z8639 Personal history of other endocrine, nutritional and metabolic disease: Secondary | ICD-10-CM | POA: Insufficient documentation

## 2015-01-22 DIAGNOSIS — G8929 Other chronic pain: Secondary | ICD-10-CM | POA: Insufficient documentation

## 2015-01-22 DIAGNOSIS — R197 Diarrhea, unspecified: Secondary | ICD-10-CM

## 2015-01-22 DIAGNOSIS — I10 Essential (primary) hypertension: Secondary | ICD-10-CM | POA: Insufficient documentation

## 2015-01-22 DIAGNOSIS — E119 Type 2 diabetes mellitus without complications: Secondary | ICD-10-CM | POA: Insufficient documentation

## 2015-01-22 DIAGNOSIS — K219 Gastro-esophageal reflux disease without esophagitis: Secondary | ICD-10-CM | POA: Insufficient documentation

## 2015-01-22 DIAGNOSIS — Z720 Tobacco use: Secondary | ICD-10-CM | POA: Insufficient documentation

## 2015-01-22 DIAGNOSIS — M549 Dorsalgia, unspecified: Secondary | ICD-10-CM

## 2015-01-22 DIAGNOSIS — J45909 Unspecified asthma, uncomplicated: Secondary | ICD-10-CM | POA: Insufficient documentation

## 2015-01-22 DIAGNOSIS — R109 Unspecified abdominal pain: Secondary | ICD-10-CM

## 2015-01-22 DIAGNOSIS — Z3202 Encounter for pregnancy test, result negative: Secondary | ICD-10-CM | POA: Insufficient documentation

## 2015-01-22 DIAGNOSIS — R110 Nausea: Secondary | ICD-10-CM | POA: Insufficient documentation

## 2015-01-22 LAB — URINALYSIS, ROUTINE W REFLEX MICROSCOPIC
BILIRUBIN URINE: NEGATIVE
Glucose, UA: NEGATIVE mg/dL
HGB URINE DIPSTICK: NEGATIVE
Ketones, ur: NEGATIVE mg/dL
Leukocytes, UA: NEGATIVE
Nitrite: NEGATIVE
PH: 7 (ref 5.0–8.0)
Protein, ur: NEGATIVE mg/dL
Specific Gravity, Urine: 1.02 (ref 1.005–1.030)
UROBILINOGEN UA: 0.2 mg/dL (ref 0.0–1.0)

## 2015-01-22 LAB — POC URINE PREG, ED: Preg Test, Ur: NEGATIVE

## 2015-01-22 MED ORDER — IBUPROFEN 800 MG PO TABS
800.0000 mg | ORAL_TABLET | Freq: Once | ORAL | Status: AC
  Administered 2015-01-22: 800 mg via ORAL
  Filled 2015-01-22: qty 1

## 2015-01-22 MED ORDER — ONDANSETRON 8 MG PO TBDP: ORAL_TABLET | ORAL | Status: DC

## 2015-01-22 MED ORDER — ONDANSETRON 4 MG PO TBDP
8.0000 mg | ORAL_TABLET | Freq: Once | ORAL | Status: AC
  Administered 2015-01-22: 8 mg via ORAL
  Filled 2015-01-22: qty 2

## 2015-01-22 MED ORDER — OXYCODONE-ACETAMINOPHEN 5-325 MG PO TABS
2.0000 | ORAL_TABLET | Freq: Once | ORAL | Status: AC
  Administered 2015-01-22: 2 via ORAL
  Filled 2015-01-22: qty 2

## 2015-01-22 NOTE — ED Provider Notes (Signed)
CSN: [id number]     Arrival date & time 01/22/15  0140 History  This chart was scribed for Zadie Rhineonald Zelie Asbill, MD by Bronson CurbJacqueline Melvin, ED Scribe. This patient was seen in room B17C/B17C and the patient's care was started at 3:05 AM.   Chief Complaint  Patient presents with  . Nephrolithiasis  . Flank Pain   Patient is a 39 y.o. female presenting with flank pain. The history is provided by the patient. No language interpreter was used.  Flank Pain This is a new problem. The current episode started 2 days ago. The problem occurs rarely. The problem has been gradually worsening. Pertinent negatives include no abdominal pain. Nothing aggravates the symptoms. Nothing relieves the symptoms. Treatments tried: 800mg  ibuprofen. The treatment provided no relief.     HPI Comments: [name] is a 39 y.o. female who presents to the Emergency Department complaining of gradually worsening right flank pain onset 2 days ago. There is associated back pain, nausea, abdominal pain described as pressure, and 4-5 episodes of diarrhea. Patient states it feels as if there is a "blockage" when urinating. Patient has taken 800mg  of ibuprofen, 3 hours ago, without relief. She reports history of kidney stones and states this current episode feels similar. Patient has past surgical history of cholecystectomy, caesarean section, and tubal ligation. She denies history of ovarian cysts. She further denies fever, vomiting, weakness of the extremities, vaginal bleeding, or discharge. LNMP 1 week ago.    Past Medical History  Diagnosis Date  . Hypertension   . Hyperlipidemia   . GERD (gastroesophageal reflux disease)   . Asthma     no inhaler  . Depression   . Diabetes mellitus     on insulin during this pregnancy  . Migraine headache   . Chronic abdominal pain    Past Surgical History  Procedure Laterality Date  . Cholecystectomy    . Wisdom tooth extraction    . Cesarean section with bilateral tubal ligation   07/18/2012    Procedure: CESAREAN SECTION WITH BILATERAL TUBAL LIGATION;  Surgeon: Mickel Baasichard D Kaplan, MD;  Location: [facility];  Service: Obstetrics;  Laterality: Bilateral;   Family History  Problem Relation Age of Onset  . Heart disease Mother   . COPD Mother   . Hypertension Mother   . Hyperlipidemia Mother   . Hearing loss Maternal Grandmother    History  Substance Use Topics  . Smoking status: Current Every Day Smoker -- 0.25 packs/day  . Smokeless tobacco: Never Used  . Alcohol Use: No   OB History    Gravida Para Term Preterm AB TAB SAB Ectopic Multiple Living   3 2 2  0 1 0 1 0 0 2     Review of Systems  Constitutional: Negative for fever.  Gastrointestinal: Positive for nausea and diarrhea. Negative for vomiting and abdominal pain.  Genitourinary: Positive for flank pain. Negative for vaginal bleeding and vaginal discharge.  Musculoskeletal: Positive for back pain.  Neurological: Negative for weakness.  All other systems reviewed and are negative.     Allergies  Imitrex; Levofloxacin; Shellfish allergy; Sulfa antibiotics; Tequin; and Betadine  Home Medications   Prior to Admission medications   Medication Sig Start Date End Date Taking? Authorizing Provider  buPROPion (WELLBUTRIN SR) 150 MG 12 hr tablet Take 150 mg by mouth 2 (two) times daily.    Historical Provider, MD  EPINEPHrine 0.3 mg/0.3 mL IJ SOAJ injection Inject 0.3 mg into the muscle. 04/09/13   Historical Provider, MD  esomeprazole (NEXIUM) 40 MG capsule Take 1 capsule (40 mg total) by mouth daily. Patient taking differently: Take 40 mg by mouth 2 (two) times daily.  10/08/14   Roxy Horseman, PA-C  FLUoxetine (PROZAC) 20 MG capsule Take 20 mg by mouth daily. 11/27/14 11/27/15  Historical Provider, MD  fluticasone Aleda Grana) 50 MCG/ACT nasal spray Use 1 squirt to each nostril daily as needed for sinus symptoms 04/03/14   Historical Provider, MD  HYDROcodone-acetaminophen (NORCO/VICODIN) 5-325 MG per tablet Take  1-2 tablets by mouth every 4 (four) hours as needed. Patient not taking: Reported on 12/26/2014 10/03/14   Elpidio Anis, PA-C  ibuprofen (ADVIL,MOTRIN) 200 MG tablet Take 800 mg by mouth every 8 (eight) hours as needed. pain    Historical Provider, MD  lisinopril (PRINIVIL,ZESTRIL) 20 MG tablet Take 20 mg by mouth daily.    Historical Provider, MD  metFORMIN (GLUCOPHAGE) 500 MG tablet Take 500 mg by mouth 2 (two) times daily with a meal.    Historical Provider, MD  nitrofurantoin, macrocrystal-monohydrate, (MACROBID) 100 MG capsule Take 1 capsule (100 mg total) by mouth 2 (two) times daily. Patient not taking: Reported on 12/26/2014 10/01/14   Loren Racer, MD  oxyCODONE-acetaminophen (PERCOCET/ROXICET) 5-325 MG per tablet Take 1 tablet by mouth every 6 (six) hours as needed. Patient not taking: Reported on 10/01/2014 05/07/14   Bethann Berkshire, MD  penicillin v potassium (VEETID) 500 MG tablet Take 1 tablet (500 mg total) by mouth 3 (three) times daily. 01/05/15   Elpidio Anis, PA-C  promethazine (PHENERGAN) 25 MG tablet Take 1 tablet (25 mg total) by mouth every 6 (six) hours as needed for nausea. 11/08/11 10/07/14  Samuel Jester, DO  topiramate (TOPAMAX) 200 MG tablet Take 200 mg by mouth 2 (two) times daily.    Historical Provider, MD   Triage Vitals: BP 137/79 mmHg  Pulse 73  Temp(Src) 97.8 F (36.6 C) (Oral)  Resp 18  Ht  (1.753 m)  Wt 238 lb (107.956 kg)  BMI 35.13 kg/m2  SpO2 99%  LMP 01/16/2015  Physical Exam  Nursing note and vitals reviewed. CONSTITUTIONAL: Well developed/well nourished HEAD: Normocephalic/atraumatic EYES: EOM ENMT: Mucous membranes moist NECK: supple no meningeal signs SPINE/BACK:entire spine nontender CV: S1/S2 noted, no murmurs/rubs/gallops noted LUNGS: Lungs are clear to auscultation bilaterally, no apparent distress ABDOMEN: soft, nontender, no rebound or guarding, bowel sounds noted throughout abdomen GU:no cva tenderness. No cmt.  No adenxal  mass/tenderness. No vag bleeding/discharge.  External genitalia unremarkable in appearance.  Female chaperone present NEURO: Pt is awake/alert/appropriate, moves all extremitiesx4.  No facial droop.   EXTREMITIES: pulses normal/equal, full ROM SKIN: warm, color normal PSYCH: no abnormalities of mood noted, alert and oriented to situation   ED Course  Procedures (including critical care time)  DIAGNOSTIC STUDIES: Oxygen Saturation is 99% on room air, normal by my interpretation.    COORDINATION OF CARE: At [phone] Discussed treatment plan with patient which includes pelvic exam. Patient agrees.  Pt thought she had kidney stone Urine unremarkable She is well appearing No focal abd tenderness Appropriate for d/c home  Labs Review Labs Reviewed  URINALYSIS, ROUTINE W REFLEX MICROSCOPIC  POC URINE PREG, ED     MDM   Final diagnoses:  None    Nursing notes including past medical history and social history reviewed and considered in documentation Labs/vital reviewed myself and considered during evaluation Previous records reviewed and considered    I personally performed the services described in this documentation, which was scribed in  my presence. The recorded information has been reviewed and is accurate.      Zadie Rhineonald Tabor Denham, MD 01/22/15 [phone]

## 2015-01-22 NOTE — ED Notes (Signed)
Patient is alert and orientedx4.  Patient was explained discharge instructions and they understood them with no questions.  The patient's husband, Mylo RedRobert Abate is coming to take the patient home.

## 2015-01-22 NOTE — ED Notes (Signed)
Pt here for right flank, back pain and diarrhea since Friday. Sts hx of kidney stone.

## 2015-01-22 NOTE — Discharge Instructions (Signed)

## 2015-01-25 ENCOUNTER — Emergency Department (HOSPITAL_COMMUNITY)
Admission: EM | Admit: 2015-01-25 | Discharge: 2015-01-26 | Disposition: A | Discharge status: Home / Self Care | Payer: Self-pay | Attending: Emergency Medicine | Admitting: Emergency Medicine

## 2015-01-25 ENCOUNTER — Encounter (HOSPITAL_COMMUNITY): Payer: Self-pay | Admitting: *Deleted

## 2015-01-25 DIAGNOSIS — I10 Essential (primary) hypertension: Secondary | ICD-10-CM | POA: Insufficient documentation

## 2015-01-25 DIAGNOSIS — F329 Major depressive disorder, single episode, unspecified: Secondary | ICD-10-CM | POA: Insufficient documentation

## 2015-01-25 DIAGNOSIS — Z9049 Acquired absence of other specified parts of digestive tract: Secondary | ICD-10-CM | POA: Insufficient documentation

## 2015-01-25 DIAGNOSIS — R350 Frequency of micturition: Secondary | ICD-10-CM

## 2015-01-25 DIAGNOSIS — J45909 Unspecified asthma, uncomplicated: Secondary | ICD-10-CM | POA: Insufficient documentation

## 2015-01-25 DIAGNOSIS — Z720 Tobacco use: Secondary | ICD-10-CM | POA: Insufficient documentation

## 2015-01-25 DIAGNOSIS — Z79899 Other long term (current) drug therapy: Secondary | ICD-10-CM | POA: Insufficient documentation

## 2015-01-25 DIAGNOSIS — Z7951 Long term (current) use of inhaled steroids: Secondary | ICD-10-CM | POA: Insufficient documentation

## 2015-01-25 DIAGNOSIS — G43909 Migraine, unspecified, not intractable, without status migrainosus: Secondary | ICD-10-CM | POA: Insufficient documentation

## 2015-01-25 DIAGNOSIS — G8929 Other chronic pain: Secondary | ICD-10-CM | POA: Insufficient documentation

## 2015-01-25 DIAGNOSIS — K219 Gastro-esophageal reflux disease without esophagitis: Secondary | ICD-10-CM | POA: Insufficient documentation

## 2015-01-25 DIAGNOSIS — R112 Nausea with vomiting, unspecified: Secondary | ICD-10-CM | POA: Insufficient documentation

## 2015-01-25 DIAGNOSIS — E785 Hyperlipidemia, unspecified: Secondary | ICD-10-CM | POA: Insufficient documentation

## 2015-01-25 DIAGNOSIS — E119 Type 2 diabetes mellitus without complications: Secondary | ICD-10-CM | POA: Insufficient documentation

## 2015-01-25 DIAGNOSIS — R109 Unspecified abdominal pain: Secondary | ICD-10-CM

## 2015-01-25 DIAGNOSIS — R1031 Right lower quadrant pain: Secondary | ICD-10-CM | POA: Insufficient documentation

## 2015-01-25 DIAGNOSIS — Z3202 Encounter for pregnancy test, result negative: Secondary | ICD-10-CM | POA: Insufficient documentation

## 2015-01-25 LAB — URINE MICROSCOPIC-ADD ON

## 2015-01-25 LAB — COMPREHENSIVE METABOLIC PANEL
ALK PHOS: 71 U/L (ref 38–126)
ALT: 17 U/L (ref 14–54)
AST: 15 U/L (ref 15–41)
Albumin: 4.4 g/dL (ref 3.5–5.0)
Anion gap: 7 (ref 5–15)
BILIRUBIN TOTAL: 0.5 mg/dL (ref 0.3–1.2)
BUN: 25 mg/dL — ABNORMAL HIGH (ref 6–20)
CALCIUM: 9.3 mg/dL (ref 8.9–10.3)
CO2: 23 mmol/L (ref 22–32)
Chloride: 106 mmol/L (ref 101–111)
Creatinine, Ser: 1.07 mg/dL — ABNORMAL HIGH (ref 0.44–1.00)
GFR calc non Af Amer: >60 mL/min (ref >60)
GLUCOSE: 117 mg/dL — AB (ref 70–99)
POTASSIUM: 3.8 mmol/L (ref 3.5–5.1)
SODIUM: 136 mmol/L (ref 135–145)
Total Protein: 7.9 g/dL (ref 6.5–8.1)

## 2015-01-25 LAB — URINALYSIS, ROUTINE W REFLEX MICROSCOPIC
BILIRUBIN URINE: NEGATIVE
GLUCOSE, UA: NEGATIVE mg/dL
Hgb urine dipstick: NEGATIVE
KETONES UR: NEGATIVE mg/dL
NITRITE: NEGATIVE
PH: 6.5 (ref 5.0–8.0)
Protein, ur: NEGATIVE mg/dL
SPECIFIC GRAVITY, URINE: 1.02 (ref 1.005–1.030)
Urobilinogen, UA: 0.2 mg/dL (ref 0.0–1.0)

## 2015-01-25 LAB — CBC WITH DIFFERENTIAL/PLATELET
BASOS ABS: 0 10*3/uL (ref 0.0–0.1)
Basophils Relative: 0 % (ref 0–1)
EOS ABS: 0.2 10*3/uL (ref 0.0–0.7)
Eosinophils Relative: 2 % (ref 0–5)
HCT: 41.7 % (ref 36.0–46.0)
Hemoglobin: 13.5 g/dL (ref 12.0–15.0)
LYMPHS PCT: 39 % (ref 12–46)
Lymphs Abs: 4 10*3/uL (ref 0.7–4.0)
MCH: 28.7 pg (ref 26.0–34.0)
MCHC: 32.4 g/dL (ref 30.0–36.0)
MCV: 88.5 fL (ref 78.0–100.0)
Monocytes Absolute: 0.7 10*3/uL (ref 0.1–1.0)
Monocytes Relative: 6 % (ref 3–12)
Neutro Abs: 5.4 10*3/uL (ref 1.7–7.7)
Neutrophils Relative %: 53 % (ref 43–77)
PLATELETS: 264 10*3/uL (ref 150–400)
RBC: 4.71 MIL/uL (ref 3.87–5.11)
RDW: 14.4 % (ref 11.5–15.5)
WBC: 10.3 10*3/uL (ref 4.0–10.5)

## 2015-01-25 LAB — POC URINE PREG, ED: PREG TEST UR: NEGATIVE

## 2015-01-25 MED ORDER — SODIUM CHLORIDE 0.9 % IV BOLUS (SEPSIS)
1000.0000 mL | Freq: Once | INTRAVENOUS | Status: AC
  Administered 2015-01-25: 1000 mL via INTRAVENOUS

## 2015-01-25 MED ORDER — ONDANSETRON HCL 4 MG/2ML IJ SOLN
4.0000 mg | Freq: Once | INTRAMUSCULAR | Status: AC
Start: 2015-01-25 — End: 2015-01-25
  Administered 2015-01-25: 4 mg via INTRAVENOUS
  Filled 2015-01-25: qty 2

## 2015-01-25 MED ORDER — MORPHINE SULFATE 4 MG/ML IJ SOLN
4.0000 mg | Freq: Once | INTRAMUSCULAR | Status: AC
  Administered 2015-01-25: 4 mg via INTRAVENOUS
  Filled 2015-01-25: qty 1

## 2015-01-25 NOTE — ED Provider Notes (Signed)
CSN: [id number]     Arrival date & time 01/25/15  2216 History   First MD Initiated Contact with Patient 01/25/15 2233     Chief Complaint  Patient presents with  . Abdominal Pain     (Consider location/radiation/quality/duration/timing/severity/associated sxs/prior Treatment) HPI   [name] is a 39 y.o. female who presents to the Emergency Department complaining of right lower abdominal pain and right flank pain that began four days ago.  [facility].  Pain improved this week with tylenol and ibuprofen.  Today, pain became more instense and persistent and localized more the the right lower abdomen, but continues to have right flank pain as well.  She reports a "pressure" or "blockage" sensation when she voids and occasionally minimal amt of urine.  She also notes having two episodes of vomiting this afternoon. She denies chest pain, shortness of breath, bloody urine, fever or pelvic pain or vaginal discharge.  Reports hx of kidney stones and pain tonight feels similar.   Past Medical History  Diagnosis Date  . Hypertension   . Hyperlipidemia   . GERD (gastroesophageal reflux disease)   . Asthma     no inhaler  . Depression   . Diabetes mellitus     on insulin during this pregnancy  . Migraine headache   . Chronic abdominal pain    Past Surgical History  Procedure Laterality Date  . Cholecystectomy    . Wisdom tooth extraction    . Cesarean section with bilateral tubal ligation  07/18/2012    Procedure: CESAREAN SECTION WITH BILATERAL TUBAL LIGATION;  Surgeon: Mickel Baasichard D Kaplan, MD;  Location: [facility];  Service: Obstetrics;  Laterality: Bilateral;   Family History  Problem Relation Age of Onset  . Heart disease Mother   . COPD Mother   . Hypertension Mother   . Hyperlipidemia Mother   . Hearing loss Maternal Grandmother    History  Substance Use Topics  . Smoking status: Current Every Day Smoker -- 0.25 packs/day  . Smokeless  tobacco: Never Used  . Alcohol Use: No   OB History    Gravida Para Term Preterm AB TAB SAB Ectopic Multiple Living   3 2 2  0 1 0 1 0 0 2     Review of Systems  Constitutional: Negative for fever and appetite change.  Respiratory: Negative for shortness of breath.   Cardiovascular: Negative for chest pain.  Gastrointestinal: Positive for nausea, vomiting and abdominal pain. Negative for diarrhea.  Genitourinary: Positive for dysuria, flank pain and difficulty urinating. Negative for frequency, hematuria, vaginal bleeding, vaginal discharge and pelvic pain.  Skin: Negative for rash.  Neurological: Negative for weakness and numbness.      Allergies  Imitrex; Levofloxacin; Shellfish allergy; Sulfa antibiotics; Tequin; and Betadine  Home Medications   Prior to Admission medications   Medication Sig Start Date End Date Taking? Authorizing Provider  buPROPion (WELLBUTRIN SR) 150 MG 12 hr tablet Take 150 mg by mouth 2 (two) times daily.    Historical Provider, MD  EPINEPHrine 0.3 mg/0.3 mL IJ SOAJ injection Inject 0.3 mg into the muscle. 04/09/13   Historical Provider, MD  esomeprazole (NEXIUM) 40 MG capsule Take 1 capsule (40 mg total) by mouth daily. Patient taking differently: Take 40 mg by mouth 2 (two) times daily.  10/08/14   Roxy Horsemanobert Browning, PA-C  FLUoxetine (PROZAC) 20 MG capsule Take 20 mg by mouth daily. 11/27/14 11/27/15  Historical Provider, MD  fluticasone (  FLONASE) 50 MCG/ACT nasal spray Use 1 squirt to each nostril daily as needed for sinus symptoms 04/03/14   Historical Provider, MD  ibuprofen (ADVIL,MOTRIN) 200 MG tablet Take 800 mg by mouth every 8 (eight) hours as needed. pain    Historical Provider, MD  lisinopril (PRINIVIL,ZESTRIL) 20 MG tablet Take 20 mg by mouth daily.    Historical Provider, MD  metFORMIN (GLUCOPHAGE) 500 MG tablet Take 500 mg by mouth 2 (two) times daily with a meal.    Historical Provider, MD  ondansetron (ZOFRAN ODT) 8 MG disintegrating tablet 8mg  ODT  q4 hours prn nausea 01/22/15   Zadie Rhineonald Wickline, MD  topiramate (TOPAMAX) 200 MG tablet Take 200 mg by mouth 2 (two) times daily.    Historical Provider, MD   BP 137/70 mmHg  Pulse 84  Temp(Src) 98 F (36.7 C) (Oral)  Resp 18  Ht 5\' 9"  (1.753 m)  Wt 232 lb (105.235 kg)  BMI 34.24 kg/m2  SpO2 100%  LMP 01/16/2015   Physical Exam  Constitutional: She is oriented to person, place, and time. She appears well-developed and well-nourished.  Patient appears uncomfortable  HENT:  Head: Normocephalic and atraumatic.  Mouth/Throat: Oropharynx is clear and moist.  Cardiovascular: Normal rate, regular rhythm, normal heart sounds and intact distal pulses.   No murmur heard. Pulmonary/Chest: Effort normal and breath sounds normal. No respiratory distress.  Abdominal: Soft. She exhibits no distension. There is tenderness. There is no rebound and no guarding.  Moderate tenderness at RLQ and patient points to right flank as additional area of pain.  No guarding, rebound tenderness or CVA tenderness  Musculoskeletal: Normal range of motion.  Neurological: She is alert and oriented to person, place, and time. She exhibits normal muscle tone. Coordination normal.  Skin: Skin is warm and dry.  Psychiatric: She has a normal mood and affect.  Nursing note and vitals reviewed.   ED Course  Procedures (including critical care time) Labs Review Labs Reviewed  URINALYSIS, ROUTINE W REFLEX MICROSCOPIC - Abnormal; Notable for the following:    APPearance HAZY (*)    Leukocytes, UA TRACE (*)    All other components within normal limits  COMPREHENSIVE METABOLIC PANEL - Abnormal; Notable for the following:    Glucose, Bld 117 (*)    BUN 25 (*)    Creatinine, Ser 1.07 (*)    All other components within normal limits  URINE MICROSCOPIC-ADD ON - Abnormal; Notable for the following:    Squamous Epithelial / LPF MANY (*)    Bacteria, UA MANY (*)    All other components within normal limits  CBC WITH  DIFFERENTIAL/PLATELET  POC URINE PREG, ED    Imaging Review No results found.   EKG Interpretation None      MDM   Final diagnoses:  None    Patient is well appearing.  Vitals are stable.  Pain improved after IV pain medications.  Labs are reassuring.  Pt tolerating ice chips  0125  CT renal stone study is pending. End of shift,  I have discussed pt hx and findings with Dr. Patria Maneampos who assumes care of the patient.     Pauline Ausammy Rebekah Sprinkle, PA-C 01/26/15 0135  Azalia BilisKevin Campos, MD 01/26/15 [phone]

## 2015-01-25 NOTE — ED Notes (Addendum)
Pt reporting RLQ abdominal pain, nausea and vomiting, starting on Monday.  States that [facility]. Pt reporting continued pain in RLQ, nausea, vomiting.  Reports treating with Tylenol and Ibuprofen with no relief.  Pt also states that she has some feelings of urinary frequency.

## 2015-01-26 ENCOUNTER — Emergency Department (HOSPITAL_COMMUNITY): Payer: Self-pay

## 2015-01-26 MED ORDER — HYDROMORPHONE HCL 1 MG/ML IJ SOLN
1.0000 mg | Freq: Once | INTRAMUSCULAR | Status: AC
  Administered 2015-01-26: 1 mg via INTRAVENOUS
  Filled 2015-01-26: qty 1

## 2015-01-26 MED ORDER — HYDROMORPHONE HCL 1 MG/ML IJ SOLN
0.5000 mg | Freq: Once | INTRAMUSCULAR | Status: AC
  Administered 2015-01-26: 0.5 mg via INTRAVENOUS
  Filled 2015-01-26: qty 1

## 2015-01-26 MED ORDER — CEPHALEXIN 500 MG PO CAPS: 500.0000 mg | ORAL_CAPSULE | Freq: Three times a day (TID) | ORAL | Status: DC

## 2015-01-26 MED ORDER — CEPHALEXIN 500 MG PO CAPS: 1000.0000 mg | ORAL_CAPSULE | Freq: Once | ORAL | Status: DC

## 2015-01-26 MED ORDER — HYDROMORPHONE HCL 1 MG/ML IJ SOLN: 0.5000 mg | Freq: Once | INTRAMUSCULAR | Status: DC

## 2015-01-26 MED ORDER — PHENAZOPYRIDINE HCL 100 MG PO TABS
200.0000 mg | ORAL_TABLET | Freq: Once | ORAL | Status: AC
  Administered 2015-01-26: 200 mg via ORAL
  Filled 2015-01-26: qty 2

## 2015-01-26 MED ORDER — HYDROCODONE-ACETAMINOPHEN 5-325 MG PO TABS
1.0000 | ORAL_TABLET | Freq: Four times a day (QID) | ORAL | Status: DC | PRN
Start: 2015-01-26 — End: 2015-08-23

## 2015-01-26 MED ORDER — KETOROLAC TROMETHAMINE 30 MG/ML IJ SOLN
30.0000 mg | Freq: Once | INTRAMUSCULAR | Status: AC
Start: 2015-01-26 — End: 2015-01-26
  Administered 2015-01-26: 30 mg via INTRAVENOUS
  Filled 2015-01-26: qty 1

## 2015-01-26 MED ORDER — DEXTROSE 5 % IV SOLN
1.0000 g | Freq: Once | INTRAVENOUS | Status: AC
  Administered 2015-01-26: 1 g via INTRAVENOUS
  Filled 2015-01-26: qty 10

## 2015-01-26 MED ORDER — ONDANSETRON 8 MG PO TBDP: 8.0000 mg | ORAL_TABLET | Freq: Three times a day (TID) | ORAL | Status: DC | PRN

## 2015-01-26 MED ORDER — OXYCODONE-ACETAMINOPHEN 5-325 MG PO TABS
1.0000 | ORAL_TABLET | Freq: Once | ORAL | Status: AC
  Administered 2015-01-26: 1 via ORAL
  Filled 2015-01-26: qty 1

## 2015-01-26 NOTE — ED Notes (Signed)
Patient transported to CT 

## 2015-01-26 NOTE — Discharge Instructions (Signed)
Abdominal Pain °Many things can cause abdominal pain. Usually, abdominal pain is not caused by a disease and will improve without treatment. It can often be observed and treated at home. Your health care provider will do a physical exam and possibly order blood tests and X-rays to help determine the seriousness of your pain. However, in many cases, more time must pass before a clear cause of the pain can be found. Before that point, your health care provider may not know if you need more testing or further treatment. °HOME CARE INSTRUCTIONS  °Monitor your abdominal pain for any changes. The following actions may help to alleviate any discomfort you are experiencing: °· Only take over-the-counter or prescription medicines as directed by your health care provider. °· Do not take laxatives unless directed to do so by your health care provider. °· Try a clear liquid diet (broth, tea, or water) as directed by your health care provider. Slowly move to a bland diet as tolerated. °SEEK MEDICAL CARE IF: °· You have unexplained abdominal pain. °· You have abdominal pain associated with nausea or diarrhea. °· You have pain when you urinate or have a bowel movement. °· You experience abdominal pain that wakes you in the night. °· You have abdominal pain that is worsened or improved by eating food. °· You have abdominal pain that is worsened with eating fatty foods. °· You have a fever. °SEEK IMMEDIATE MEDICAL CARE IF:  °· Your pain does not go away within 2 hours. °· You keep throwing up (vomiting). °· Your pain is felt only in portions of the abdomen, such as the right side or the left lower portion of the abdomen. °· You pass bloody or black tarry stools. °MAKE SURE YOU: °· Understand these instructions.   °· Will watch your condition.   °· Will get help right away if you are not doing well or get worse.   °Document Released: 06/16/2005 Document Revised: 09/11/2013 Document Reviewed: 05/16/2013 °ExitCare® Patient Information  ©2015 [employer]. This information is not intended to replace advice given to you by your health care provider. Make sure you discuss any questions you have with your health care provider. °Urinary Tract Infection °Urinary tract infections (UTIs) can develop anywhere along your urinary tract. Your urinary tract is your body's drainage system for removing wastes and extra water. Your urinary tract includes two kidneys, two ureters, a bladder, and a urethra. Your kidneys are a pair of bean-shaped organs. Each kidney is about the size of your fist. They are located below your ribs, one on each side of your spine. °CAUSES °Infections are caused by microbes, which are microscopic organisms, including fungi, viruses, and bacteria. These organisms are so small that they can only be seen through a microscope. Bacteria are the microbes that most commonly cause UTIs. °SYMPTOMS  °Symptoms of UTIs may vary by age and gender of the patient and by the location of the infection. Symptoms in young women typically include a frequent and intense urge to urinate and a painful, burning feeling in the bladder or urethra during urination. Older women and men are more likely to be tired, shaky, and weak and have muscle aches and abdominal pain. A fever may mean the infection is in your kidneys. Other symptoms of a kidney infection include pain in your back or sides below the ribs, nausea, and vomiting. °DIAGNOSIS °To diagnose a UTI, your caregiver will ask you about your symptoms. Your caregiver also will ask to provide a urine sample. The urine sample   will be tested for bacteria and white blood cells. White blood cells are made by your body to help fight infection. °TREATMENT  °Typically, UTIs can be treated with medication. Because most UTIs are caused by a bacterial infection, they usually can be treated with the use of antibiotics. The choice of antibiotic and length of treatment depend on your symptoms and the type of bacteria  causing your infection. °HOME CARE INSTRUCTIONS °· If you were prescribed antibiotics, take them exactly as your caregiver instructs you. Finish the medication even if you feel better after you have only taken some of the medication. °· Drink enough water and fluids to keep your urine clear or pale yellow. °· Avoid caffeine, tea, and carbonated beverages. They tend to irritate your bladder. °· Empty your bladder often. Avoid holding urine for long periods of time. °· Empty your bladder before and after sexual intercourse. °· After a bowel movement, women should cleanse from front to back. Use each tissue only once. °SEEK MEDICAL CARE IF:  °· You have back pain. °· You develop a fever. °· Your symptoms do not begin to resolve within 3 days. °SEEK IMMEDIATE MEDICAL CARE IF:  °· You have severe back pain or lower abdominal pain. °· You develop chills. °· You have nausea or vomiting. °· You have continued burning or discomfort with urination. °MAKE SURE YOU:  °· Understand these instructions. °· Will watch your condition. °· Will get help right away if you are not doing well or get worse. °Document Released: 06/16/2005 Document Revised: 03/07/2012 Document Reviewed: 10/15/2011 °ExitCare® Patient Information ©2015 [employer]. This information is not intended to replace advice given to you by your health care provider. Make sure you discuss any questions you have with your health care provider. ° °

## 2015-01-26 NOTE — ED Notes (Signed)
Pt back from CT

## 2015-01-27 LAB — URINE CULTURE

## 2015-04-07 ENCOUNTER — Emergency Department (HOSPITAL_COMMUNITY)
Admission: EM | Admit: 2015-04-07 | Discharge: 2015-04-07 | Disposition: A | Discharge status: Home / Self Care | Payer: Self-pay | Attending: Emergency Medicine | Admitting: Emergency Medicine

## 2015-04-07 ENCOUNTER — Encounter (HOSPITAL_COMMUNITY): Payer: Self-pay | Admitting: Emergency Medicine

## 2015-04-07 DIAGNOSIS — S46011A Strain of muscle(s) and tendon(s) of the rotator cuff of right shoulder, initial encounter: Secondary | ICD-10-CM | POA: Insufficient documentation

## 2015-04-07 DIAGNOSIS — Y9389 Activity, other specified: Secondary | ICD-10-CM | POA: Insufficient documentation

## 2015-04-07 DIAGNOSIS — X58XXXA Exposure to other specified factors, initial encounter: Secondary | ICD-10-CM | POA: Insufficient documentation

## 2015-04-07 DIAGNOSIS — J45909 Unspecified asthma, uncomplicated: Secondary | ICD-10-CM | POA: Insufficient documentation

## 2015-04-07 DIAGNOSIS — Z87891 Personal history of nicotine dependence: Secondary | ICD-10-CM | POA: Insufficient documentation

## 2015-04-07 DIAGNOSIS — Y929 Unspecified place or not applicable: Secondary | ICD-10-CM | POA: Insufficient documentation

## 2015-04-07 DIAGNOSIS — Y998 Other external cause status: Secondary | ICD-10-CM | POA: Insufficient documentation

## 2015-04-07 DIAGNOSIS — I10 Essential (primary) hypertension: Secondary | ICD-10-CM | POA: Insufficient documentation

## 2015-04-07 DIAGNOSIS — G43909 Migraine, unspecified, not intractable, without status migrainosus: Secondary | ICD-10-CM | POA: Insufficient documentation

## 2015-04-07 DIAGNOSIS — K219 Gastro-esophageal reflux disease without esophagitis: Secondary | ICD-10-CM | POA: Insufficient documentation

## 2015-04-07 DIAGNOSIS — M791 Myalgia, unspecified site: Secondary | ICD-10-CM

## 2015-04-07 DIAGNOSIS — G8929 Other chronic pain: Secondary | ICD-10-CM | POA: Insufficient documentation

## 2015-04-07 DIAGNOSIS — S46911A Strain of unspecified muscle, fascia and tendon at shoulder and upper arm level, right arm, initial encounter: Secondary | ICD-10-CM

## 2015-04-07 DIAGNOSIS — S43421A Sprain of right rotator cuff capsule, initial encounter: Secondary | ICD-10-CM | POA: Insufficient documentation

## 2015-04-07 DIAGNOSIS — F329 Major depressive disorder, single episode, unspecified: Secondary | ICD-10-CM | POA: Insufficient documentation

## 2015-04-07 DIAGNOSIS — Z792 Long term (current) use of antibiotics: Secondary | ICD-10-CM | POA: Insufficient documentation

## 2015-04-07 DIAGNOSIS — E785 Hyperlipidemia, unspecified: Secondary | ICD-10-CM | POA: Insufficient documentation

## 2015-04-07 DIAGNOSIS — Z79899 Other long term (current) drug therapy: Secondary | ICD-10-CM | POA: Insufficient documentation

## 2015-04-07 MED ORDER — CYCLOBENZAPRINE HCL 10 MG PO TABS
10.0000 mg | ORAL_TABLET | Freq: Once | ORAL | Status: AC
  Administered 2015-04-07: 10 mg via ORAL
  Filled 2015-04-07: qty 1

## 2015-04-07 MED ORDER — NAPROXEN 500 MG PO TABS: 500.0000 mg | ORAL_TABLET | Freq: Two times a day (BID) | ORAL | Status: DC

## 2015-04-07 MED ORDER — METHOCARBAMOL 500 MG PO TABS: 500.0000 mg | ORAL_TABLET | Freq: Three times a day (TID) | ORAL | Status: DC | PRN

## 2015-04-07 MED ORDER — HYDROCODONE-ACETAMINOPHEN 5-325 MG PO TABS
1.0000 | ORAL_TABLET | Freq: Once | ORAL | Status: AC
  Administered 2015-04-07: 1 via ORAL
  Filled 2015-04-07: qty 1

## 2015-04-07 NOTE — ED Notes (Signed)
Pain in right shoulder/upper back that is going down right arm, no known injury

## 2015-04-07 NOTE — ED Provider Notes (Signed)
CSN: [id number]     Arrival date & time 04/07/15  2115 History   First MD Initiated Contact with Patient 04/07/15 2150     Chief Complaint  Patient presents with  . Back Pain    Pain in upper back, neck, and right arm      HPI  Presents evaluation of right shoulder pain. She is left-handed. [place].. Has been doing this for about 2 weeks. Radial more lifting and repetitive motion that she is typically used to doing. Has had burning aching pain in the posterior cervical right shoulder primarily at night for the last 2-3 days and presents here. His reproducible with movement of her arm and shoulder. Denies chest pain or shortness of breath. No fall no direct injury or trauma. Past similar episodes.  Past Medical History  Diagnosis Date  . Hypertension   . Hyperlipidemia   . GERD (gastroesophageal reflux disease)   . Asthma     no inhaler  . Depression   . Diabetes mellitus     on insulin during this pregnancy  . Migraine headache   . Chronic abdominal pain    Past Surgical History  Procedure Laterality Date  . Cholecystectomy    . Wisdom tooth extraction    . Cesarean section with bilateral tubal ligation  07/18/2012    Procedure: CESAREAN SECTION WITH BILATERAL TUBAL LIGATION;  Surgeon: Mickel Baas, MD;  Location: [facility];  Service: Obstetrics;  Laterality: Bilateral;   Family History  Problem Relation Age of Onset  . Heart disease Mother   . COPD Mother   . Hypertension Mother   . Hyperlipidemia Mother   . Hearing loss Maternal Grandmother    History  Substance Use Topics  . Smoking status: Former Smoker -- 0.25 packs/day    Quit date: 12/06/2014  . Smokeless tobacco: Never Used  . Alcohol Use: No   OB History    Gravida Para Term Preterm AB TAB SAB Ectopic Multiple Living   0 1 0 1 0 0 2     Review of Systems  Constitutional: Negative for fever, chills, diaphoresis, appetite change and fatigue.  HENT:  Negative for mouth sores, sore throat and trouble swallowing.   Eyes: Negative for visual disturbance.  Respiratory: Negative for cough, chest tightness, shortness of breath and wheezing.   Cardiovascular: Negative for chest pain.  Gastrointestinal: Negative for nausea, vomiting, abdominal pain, diarrhea and abdominal distention.  Endocrine: Negative for polydipsia, polyphagia and polyuria.  Genitourinary: Negative for dysuria, frequency and hematuria.  Musculoskeletal: Positive for arthralgias. Negative for gait problem.  Skin: Negative for color change, pallor and rash.  Neurological: Negative for dizziness, syncope, light-headedness and headaches.  Hematological: Does not bruise/bleed easily.  Psychiatric/Behavioral: Negative for behavioral problems and confusion.      Allergies  Imitrex; Levofloxacin; Shellfish allergy; Sulfa antibiotics; Tequin; and Betadine  Home Medications   Prior to Admission medications   Medication Sig Start Date End Date Taking? Authorizing Provider  buPROPion (WELLBUTRIN SR) 150 MG 12 hr tablet Take 150 mg by mouth 2 (two) times daily.    Historical Provider, MD  cephALEXin (KEFLEX) 500 MG capsule Take 1 capsule (500 mg total) by mouth 3 (three) times daily. 01/26/15   Azalia Bilis, MD  EPINEPHrine 0.3 mg/0.3 mL IJ SOAJ injection Inject 0.3 mg into the muscle. 04/09/13   Historical Provider, MD  esomeprazole (NEXIUM) 40 MG capsule Take 1 capsule (40  mg total) by mouth daily. Patient taking differently: Take 40 mg by mouth 2 (two) times daily.  10/08/14   Roxy Horsemanobert Browning, PA-C  FLUoxetine (PROZAC) 20 MG capsule Take 20 mg by mouth daily. 11/27/14 11/27/15  Historical Provider, MD  fluticasone Aleda Grana(FLONASE) 50 MCG/ACT nasal spray Use 1 squirt to each nostril daily as needed for sinus symptoms 04/03/14   Historical Provider, MD  HYDROcodone-acetaminophen (NORCO/VICODIN) 5-325 MG per tablet Take 1 tablet by mouth every 6 (six) hours as needed for moderate pain. 01/26/15    Azalia BilisKevin Campos, MD  ibuprofen (ADVIL,MOTRIN) 200 MG tablet Take 800 mg by mouth every 8 (eight) hours as needed. pain    Historical Provider, MD  lisinopril (PRINIVIL,ZESTRIL) 20 MG tablet Take 20 mg by mouth daily.    Historical Provider, MD  metFORMIN (GLUCOPHAGE) 500 MG tablet Take 500 mg by mouth 2 (two) times daily with a meal.    Historical Provider, MD  methocarbamol (ROBAXIN) 500 MG tablet Take 1 tablet (500 mg total) by mouth 3 (three) times daily between meals as needed. 04/07/15   Rolland PorterMark Airanna Partin, MD  naproxen (NAPROSYN) 500 MG tablet Take 1 tablet (500 mg total) by mouth 2 (two) times daily. 04/07/15   Rolland PorterMark Kanden Carey, MD  ondansetron (ZOFRAN ODT) 8 MG disintegrating tablet Take 1 tablet (8 mg total) by mouth every 8 (eight) hours as needed for nausea or vomiting. 01/26/15   Azalia BilisKevin Campos, MD  simvastatin (ZOCOR) 20 MG tablet Take 20 mg by mouth daily.    Historical Provider, MD  topiramate (TOPAMAX) 200 MG tablet Take 200 mg by mouth 2 (two) times daily.    Historical Provider, MD   BP 137/74 mmHg  Pulse 66  Temp(Src) 97.9 F (36.6 C) (Oral)  Resp 18  Ht 5\' 9"  (1.753 m)  Wt 224 lb (101.606 kg)  BMI 33.06 kg/m2  SpO2 97%  LMP 03/29/2015 Physical Exam  Constitutional: She is oriented to person, place, and time. She appears well-developed and well-nourished. No distress.  HENT:  Head: Normocephalic.  Eyes: Conjunctivae are normal. Pupils are equal, round, and reactive to light. No scleral icterus.  Neck: Normal range of motion. Neck supple. No thyromegaly present.  Cardiovascular: Normal rate and regular rhythm.  Exam reveals no gallop and no friction rub.   No murmur heard. Pulmonary/Chest: Effort normal and breath sounds normal. No respiratory distress. She has no wheezes. She has no rales.  Abdominal: Soft. Bowel sounds are normal. She exhibits no distension. There is no tenderness. There is no rebound.  Musculoskeletal: Normal range of motion.       Arms: Neurological: She is alert and  oriented to person, place, and time.  Skin: Skin is warm and dry. No rash noted.  Psychiatric: She has a normal mood and affect. Her behavior is normal.    ED Course  Procedures (including critical care time) Labs Review Labs Reviewed - No data to display  Imaging Review No results found.   EKG Interpretation None      MDM   Final diagnoses:  Muscular pain  Shoulder strain, right, initial encounter  Rotator cuff (capsule) sprain and strain, right, initial encounter    Reproducible tenderness in the posterior shoulder. Some symptoms reproduced with rotator cuff manipulation. Some tenderness in the lateral neck. No pulmonary or cardiac complaints. Plan his antibiotics inflammatory, muscle relaxants. Try to avoid her new repetitive motion at her new job.    Rolland PorterMark Aireona Torelli, MD 04/07/15 2203

## 2015-04-07 NOTE — Discharge Instructions (Signed)
Shoulder Sprain  A shoulder sprain is the result of damage to the tough, fiber-like tissues (ligaments) that help hold your shoulder in place. The ligaments may be stretched or torn. Besides the main shoulder joint (the ball and socket), there are several smaller joints that connect the bones in this area. A sprain usually involves one of those joints. Most often it is the acromioclavicular (or AC) joint. That is the joint that connects the collarbone (clavicle) and the shoulder blade (scapula) at the top point of the shoulder blade (acromion).  A shoulder sprain is a mild form of what is called a shoulder separation. Recovering from a shoulder sprain may take some time. For some, pain lingers for several months. Most people recover without long term problems.  CAUSES    A shoulder sprain is usually caused by some kind of trauma. This might be:   Falling on an outstretched arm.   Being hit hard on the shoulder.   Twisting the arm.   Shoulder sprains are more likely to occur in people who:   Play sports.   Have balance or coordination problems.  SYMPTOMS    Pain when you move your shoulder.   Limited ability to move the shoulder.   Swelling and tenderness on top of the shoulder.   Redness or warmth in the shoulder.   Bruising.   A change in the shape of the shoulder.  DIAGNOSIS   Your healthcare provider may:   Ask about your symptoms.   Ask about recent activity that might have caused those symptoms.   Examine your shoulder. You may be asked to do simple exercises to test movement. The other shoulder will be examined for comparison.   Order some tests that provide a look inside the body. They can show the extent of the injury. The tests could include:   X-rays.   CT (computed tomography) scan.   MRI (magnetic resonance imaging) scan.  RISKS AND COMPLICATIONS   Loss of full shoulder motion.   Ongoing shoulder pain.  TREATMENT   How long it takes to recover from a shoulder sprain depends on how  severe it was. Treatment options may include:   Rest. You should not use the arm or shoulder until it heals.   Ice. For 2 or 3 days after the injury, put an ice pack on the shoulder up to 4 times a day. It should stay on for 15 to 20 minutes each time. Wrap the ice in a towel so it does not touch your skin.   Over-the-counter medicine to relieve pain.   A sling or brace. This will keep the arm still while the shoulder is healing.   Physical therapy or rehabilitation exercises. These will help you regain strength and motion. Ask your healthcare provider when it is OK to begin these exercises.   Surgery. The need for surgery is rare with a sprained shoulder, but some people may need surgery to keep the joint in place and reduce pain.  HOME CARE INSTRUCTIONS    Ask your healthcare provider about what you should and should not do while your shoulder heals.   Make sure you know how to apply ice to the correct area of your shoulder.   Talk with your healthcare provider about which medications should be used for pain and swelling.   If rehabilitation therapy will be needed, ask your healthcare provider to refer you to a therapist. If it is not recommended, then ask about at-home exercises. Find   Revised: 11/29/2011 Document Reviewed: 01/23/2009 Exit[employer] Patient Information 2015 [employer] [place]. This information is not intended to replace advice given to you by your health care provider. Make sure you discuss any questions you have with your health care provider.  Rotator Cuff Tendinitis  Rotator cuff tendinitis is inflammation of the tough, cord-like bands that connect muscle to bone (tendons) in your rotator cuff. Your rotator cuff is the collection of all the  muscles and tendons that connect your arm to your shoulder. Your rotator cuff holds the head of your upper arm bone (humerus) in the cup (fossa) of your shoulder blade (scapula). CAUSES Rotator cuff tendinitis is usually caused by overusing the joint involved.  SIGNS AND SYMPTOMS  Deep ache in the shoulder also felt on the outside upper arm over the shoulder muscle.  Point tenderness over the area that is injured.  Pain comes on gradually and becomes worse with lifting the arm to the side (abduction) or turning it inward (internal rotation).  May lead to a chronic tear: When a rotator cuff tendon becomes inflamed, it runs the risk of losing its blood supply, causing some tendon fibers to die. This increases the risk that the tendon can fray and partially or completely tear. DIAGNOSIS Rotator cuff tendinitis is diagnosed by taking a medical history, performing a physical exam, and reviewing results of imaging exams. The medical history is useful to help determine the type of rotator cuff injury. The physical exam will include looking at the injured shoulder, feeling the injured area, and watching you do range-of-motion exercises. X-ray exams are typically done to rule out other causes of shoulder pain, such as fractures. MRI is the imaging exam usually used for significant shoulder injuries. Sometimes a dye study called CT arthrogram is done, but it is not as widely used as MRI. In some institutions, special ultrasound tests may also be used to aid in the diagnosis. TREATMENT  Less Severe Cases  Use of a sling to rest the shoulder for a short period of time. Prolonged use of the sling can cause stiffness, weakness, and loss of motion of the shoulder joint.  Anti-inflammatory medicines, such as ibuprofen or naproxen sodium, may be prescribed. More Severe Cases  Physical therapy.  Use of steroid injections into the shoulder joint.  Surgery. HOME CARE INSTRUCTIONS   Use a sling or splint  until the pain decreases. Prolonged use of the sling can cause stiffness, weakness, and loss of motion of the shoulder joint.  Apply ice to the injured area:  Put ice in a plastic bag.  Place a towel between your skin and the bag.  Leave the ice on for 20 minutes, 2-3 times a day.  Try to avoid use other than gentle range of motion while your shoulder is painful. Use the shoulder and exercise only as directed by your health care provider. Stop exercises or range of motion if pain or discomfort increases, unless directed otherwise by your health care provider.  Only take over-the-counter or prescription medicines for pain, discomfort, or fever as directed by your health care provider.  If you were given a shoulder sling and straps (immobilizer), do not remove it except as directed, or until you see a health care provider for a follow-up exam. If you need to remove it, move your arm as little as possible or as directed.  You may want to sleep on several pillows at night to lessen swelling and pain. SEEK IMMEDIATE MEDICAL CARE IF:   Your shoulder  pain increases or new pain develops in your arm, hand, or fingers and is not relieved with medicines.  You have new, unexplained symptoms, especially increased numbness in the hands or loss of strength.  You develop any worsening of the problems that brought you in for care.  Your arm, hand, or fingers are numb or tingling.  Your arm, hand, or fingers are swollen, painful, or turn white or blue. MAKE SURE YOU:  Understand these instructions.  Will watch your condition.  Will get help right away if you are not doing well or get worse. Document Released: 11/27/2003 Document Revised: 06/27/2013 Document Reviewed: 04/18/2013 [facility] Patient Information 2015 [place], [place]. This information is not intended to replace advice given to you by your health care provider. Make sure you discuss any questions you have with your health care  provider.

## 2015-08-23 ENCOUNTER — Encounter (HOSPITAL_COMMUNITY): Payer: Self-pay | Admitting: Emergency Medicine

## 2015-08-23 ENCOUNTER — Emergency Department (HOSPITAL_COMMUNITY): Payer: BLUE CROSS/BLUE SHIELD

## 2015-08-23 ENCOUNTER — Emergency Department (HOSPITAL_COMMUNITY)
Admission: EM | Admit: 2015-08-23 | Discharge: 2015-08-23 | Disposition: A | Discharge status: Home / Self Care | Payer: BLUE CROSS/BLUE SHIELD | Attending: Emergency Medicine | Admitting: Emergency Medicine

## 2015-08-23 DIAGNOSIS — I10 Essential (primary) hypertension: Secondary | ICD-10-CM | POA: Insufficient documentation

## 2015-08-23 DIAGNOSIS — E119 Type 2 diabetes mellitus without complications: Secondary | ICD-10-CM | POA: Insufficient documentation

## 2015-08-23 DIAGNOSIS — F329 Major depressive disorder, single episode, unspecified: Secondary | ICD-10-CM | POA: Diagnosis not present

## 2015-08-23 DIAGNOSIS — Z3202 Encounter for pregnancy test, result negative: Secondary | ICD-10-CM | POA: Diagnosis not present

## 2015-08-23 DIAGNOSIS — N760 Acute vaginitis: Secondary | ICD-10-CM | POA: Insufficient documentation

## 2015-08-23 DIAGNOSIS — G8929 Other chronic pain: Secondary | ICD-10-CM | POA: Insufficient documentation

## 2015-08-23 DIAGNOSIS — E785 Hyperlipidemia, unspecified: Secondary | ICD-10-CM | POA: Insufficient documentation

## 2015-08-23 DIAGNOSIS — Z87891 Personal history of nicotine dependence: Secondary | ICD-10-CM | POA: Insufficient documentation

## 2015-08-23 DIAGNOSIS — N83201 Unspecified ovarian cyst, right side: Secondary | ICD-10-CM | POA: Diagnosis not present

## 2015-08-23 DIAGNOSIS — Z79899 Other long term (current) drug therapy: Secondary | ICD-10-CM | POA: Diagnosis not present

## 2015-08-23 DIAGNOSIS — K219 Gastro-esophageal reflux disease without esophagitis: Secondary | ICD-10-CM | POA: Diagnosis not present

## 2015-08-23 DIAGNOSIS — J45909 Unspecified asthma, uncomplicated: Secondary | ICD-10-CM | POA: Insufficient documentation

## 2015-08-23 DIAGNOSIS — G43909 Migraine, unspecified, not intractable, without status migrainosus: Secondary | ICD-10-CM | POA: Diagnosis not present

## 2015-08-23 DIAGNOSIS — B9689 Other specified bacterial agents as the cause of diseases classified elsewhere: Secondary | ICD-10-CM

## 2015-08-23 DIAGNOSIS — R109 Unspecified abdominal pain: Secondary | ICD-10-CM | POA: Diagnosis present

## 2015-08-23 LAB — POC URINE PREG, ED: Preg Test, Ur: NEGATIVE

## 2015-08-23 LAB — CBC WITH DIFFERENTIAL/PLATELET
Basophils Absolute: 0 10*3/uL (ref 0.0–0.1)
Basophils Relative: 0 %
Eosinophils Absolute: 0.2 10*3/uL (ref 0.0–0.7)
Eosinophils Relative: 3 %
HCT: 35.9 % — ABNORMAL LOW (ref 36.0–46.0)
Hemoglobin: 11.6 g/dL — ABNORMAL LOW (ref 12.0–15.0)
Lymphocytes Relative: 39 %
Lymphs Abs: 2.8 10*3/uL (ref 0.7–4.0)
MCH: 27.9 pg (ref 26.0–34.0)
MCHC: 32.3 g/dL (ref 30.0–36.0)
MCV: 86.3 fL (ref 78.0–100.0)
Monocytes Absolute: 0.6 10*3/uL (ref 0.1–1.0)
Monocytes Relative: 8 %
Neutro Abs: 3.5 10*3/uL (ref 1.7–7.7)
Neutrophils Relative %: 50 %
PLATELETS: 245 10*3/uL (ref 150–400)
RBC: 4.16 MIL/uL (ref 3.87–5.11)
RDW: 14.5 % (ref 11.5–15.5)
WBC: 7.1 10*3/uL (ref 4.0–10.5)

## 2015-08-23 LAB — URINALYSIS, ROUTINE W REFLEX MICROSCOPIC
Bilirubin Urine: NEGATIVE
GLUCOSE, UA: NEGATIVE mg/dL
Hgb urine dipstick: NEGATIVE
Ketones, ur: NEGATIVE mg/dL
LEUKOCYTES UA: NEGATIVE
Nitrite: NEGATIVE
PROTEIN: NEGATIVE mg/dL
pH: 6 (ref 5.0–8.0)

## 2015-08-23 LAB — WET PREP, GENITAL
Sperm: NONE SEEN
Trich, Wet Prep: NONE SEEN
Yeast Wet Prep HPF POC: NONE SEEN

## 2015-08-23 LAB — BASIC METABOLIC PANEL
Anion gap: 6 (ref 5–15)
BUN: 20 mg/dL (ref 6–20)
CALCIUM: 8.8 mg/dL — AB (ref 8.9–10.3)
CO2: 22 mmol/L (ref 22–32)
Chloride: 111 mmol/L (ref 101–111)
Creatinine, Ser: 0.93 mg/dL (ref 0.44–1.00)
GFR calc non Af Amer: >60 mL/min (ref >60)
Glucose, Bld: 101 mg/dL — ABNORMAL HIGH (ref 65–99)
Potassium: 4.3 mmol/L (ref 3.5–5.1)
Sodium: 139 mmol/L (ref 135–145)

## 2015-08-23 MED ORDER — MORPHINE SULFATE (PF) 4 MG/ML IV SOLN
4.0000 mg | INTRAVENOUS | Status: DC | PRN
  Administered 2015-08-23: 4 mg via INTRAVENOUS
  Filled 2015-08-23: qty 1

## 2015-08-23 MED ORDER — ONDANSETRON 8 MG PO TBDP
8.0000 mg | ORAL_TABLET | Freq: Once | ORAL | Status: AC
  Administered 2015-08-23: 8 mg via ORAL
  Filled 2015-08-23: qty 1

## 2015-08-23 MED ORDER — ONDANSETRON 8 MG PO TBDP: 8.0000 mg | ORAL_TABLET | Freq: Three times a day (TID) | ORAL | Status: DC | PRN

## 2015-08-23 MED ORDER — NAPROXEN 500 MG PO TABS: 500.0000 mg | ORAL_TABLET | Freq: Two times a day (BID) | ORAL | Status: DC

## 2015-08-23 MED ORDER — MORPHINE SULFATE (PF) 4 MG/ML IV SOLN
4.0000 mg | Freq: Once | INTRAVENOUS | Status: AC
  Administered 2015-08-23: 4 mg via INTRAVENOUS
  Filled 2015-08-23: qty 1

## 2015-08-23 MED ORDER — KETOROLAC TROMETHAMINE 60 MG/2ML IM SOLN
60.0000 mg | Freq: Once | INTRAMUSCULAR | Status: AC
  Administered 2015-08-23: 60 mg via INTRAMUSCULAR
  Filled 2015-08-23: qty 2

## 2015-08-23 MED ORDER — METRONIDAZOLE 500 MG PO TABS: 500.0000 mg | ORAL_TABLET | Freq: Two times a day (BID) | ORAL | Status: DC

## 2015-08-23 MED ORDER — IOHEXOL 300 MG/ML  SOLN
100.0000 mL | Freq: Once | INTRAMUSCULAR | Status: AC | PRN
  Administered 2015-08-23: 100 mL via INTRAVENOUS

## 2015-08-23 MED ORDER — IOHEXOL 300 MG/ML  SOLN
50.0000 mL | Freq: Once | INTRAMUSCULAR | Status: AC | PRN
  Administered 2015-08-23: 50 mL via ORAL

## 2015-08-23 MED ORDER — HYDROCODONE-ACETAMINOPHEN 5-325 MG PO TABS: 1.0000 | ORAL_TABLET | ORAL | Status: DC | PRN

## 2015-08-23 NOTE — ED Provider Notes (Signed)
CSN: [id number]     Arrival date & time 08/23/15  0902 History   First MD Initiated Contact with Patient 08/23/15 0913     Chief Complaint  Patient presents with  . Vaginal Discharge  . Abdominal Pain     (Consider location/radiation/quality/duration/timing/severity/associated sxs/prior Treatment) The history is provided by the patient.   [name] is a 39 y.o. female presenting with a 7 day history of dysuria, described as burning painful urination along with suprapubic pressure and increased urinary frequency.  She has used OTC Uristat and has increased her fluid intake but continues to have symptoms, worsened over the past 48 hours as she describes radiation of pain now into her right lower back and nausea with emesis which started this morning, describing 2 episodes of vomiting prior to arrival.  She denies any documented or subjective fevers.  This morning after urinating she noted increased cloudy urine along with green to brown tinge on the toilet paper with wiping and is concerned with worsening infection.  She has has had no discharge prior to wiping this am.  She is sexually active with her husband, denies high risk behaviors for std's.      Past Medical History  Diagnosis Date  . Hypertension   . Hyperlipidemia   . GERD (gastroesophageal reflux disease)   . Asthma     no inhaler  . Depression   . Diabetes mellitus     on insulin during this pregnancy  . Migraine headache   . Chronic abdominal pain    Past Surgical History  Procedure Laterality Date  . Cholecystectomy    . Wisdom tooth extraction    . Cesarean section with bilateral tubal ligation  07/18/2012    Procedure: CESAREAN SECTION WITH BILATERAL TUBAL LIGATION;  Surgeon: Mickel Baas, MD;  Location: [facility];  Service: Obstetrics;  Laterality: Bilateral;   Family History  Problem Relation Age of Onset  . Heart disease Mother   . COPD Mother   . Hypertension Mother   . Hyperlipidemia Mother   .  Hearing loss Maternal Grandmother    Social History  Substance Use Topics  . Smoking status: Former Smoker -- 0.25 packs/day    Quit date: 12/06/2014  . Smokeless tobacco: Never Used  . Alcohol Use: No   OB History    Gravida Para Term Preterm AB TAB SAB Ectopic Multiple Living   3 2 2  0 1 0 1 0 0 2     Review of Systems  Constitutional: Negative for fever.  HENT: Negative for congestion and sore throat.   Eyes: Negative.   Respiratory: Negative for chest tightness and shortness of breath.   Cardiovascular: Negative for chest pain.  Gastrointestinal: Negative for nausea and abdominal pain.  Genitourinary: Positive for dysuria, urgency, frequency, hematuria and flank pain. Negative for vaginal discharge.  Musculoskeletal: Negative for joint swelling, arthralgias and neck pain.  Skin: Negative.  Negative for rash and wound.  Neurological: Negative for dizziness, weakness, light-headedness, numbness and headaches.  Psychiatric/Behavioral: Negative.       Allergies  Imitrex; Levofloxacin; Shellfish allergy; Sulfa antibiotics; Tequin; and Betadine  Home Medications   Prior to Admission medications   Medication Sig Start Date End Date Taking? Authorizing Provider  acetaminophen (TYLENOL) 500 MG tablet Take 500 mg by mouth every 6 (six) hours as needed for mild pain.   Yes Historical Provider, MD  buPROPion (WELLBUTRIN SR) 150 MG 12 hr tablet Take 150 mg by mouth 2 (two) times  daily.   Yes Historical Provider, MD  EPINEPHrine 0.3 mg/0.3 mL IJ SOAJ injection Inject 0.3 mg into the muscle once as needed (allergic reaction).  04/09/13  Yes Historical Provider, MD  FLUoxetine (PROZAC) 20 MG capsule Take 20 mg by mouth daily. 11/27/14 11/27/15 Yes Historical Provider, MD  ibuprofen (ADVIL,MOTRIN) 200 MG tablet Take 800 mg by mouth every 8 (eight) hours as needed. pain   Yes Historical Provider, MD  metFORMIN (GLUCOPHAGE-XR) 500 MG 24 hr tablet Take 500 mg by mouth 2 (two) times daily.   Yes  Historical Provider, MD  omeprazole (PRILOSEC) 40 MG capsule Take 40 mg by mouth 2 (two) times daily.   Yes Historical Provider, MD  simvastatin (ZOCOR) 20 MG tablet Take 20 mg by mouth daily.   Yes Historical Provider, MD  topiramate (TOPAMAX) 200 MG tablet Take 200 mg by mouth 2 (two) times daily.   Yes Historical Provider, MD  esomeprazole (NEXIUM) 40 MG capsule Take 1 capsule (40 mg total) by mouth daily. Patient not taking: Reported on 08/23/2015 10/08/14   Roxy Horsemanobert Browning, PA-C  HYDROcodone-acetaminophen (NORCO/VICODIN) 5-325 MG tablet Take 1 tablet by mouth every 4 (four) hours as needed. 08/23/15   Burgess AmorJulie Mumtaz Lovins, PA-C  methocarbamol (ROBAXIN) 500 MG tablet Take 1 tablet (500 mg total) by mouth 3 (three) times daily between meals as needed. Patient not taking: Reported on 08/23/2015 04/07/15   Rolland PorterMark James, MD  metroNIDAZOLE (FLAGYL) 500 MG tablet Take 1 tablet (500 mg total) by mouth 2 (two) times daily. 08/23/15   Burgess AmorJulie Denika Krone, PA-C  naproxen (NAPROSYN) 500 MG tablet Take 1 tablet (500 mg total) by mouth 2 (two) times daily. 08/23/15   Burgess AmorJulie Aylah Yeary, PA-C  ondansetron (ZOFRAN ODT) 8 MG disintegrating tablet Take 1 tablet (8 mg total) by mouth every 8 (eight) hours as needed for nausea or vomiting. 08/23/15   Burgess AmorJulie Marian Grandt, PA-C   BP 120/81 mmHg  Pulse 60  Temp(Src) 98 F (36.7 C) (Oral)  Resp 16  Ht 5\' 9"  (1.753 m)  Wt 101.606 kg  BMI 33.06 kg/m2  SpO2 100%  LMP 08/19/2015 Physical Exam  Constitutional: She appears well-developed and well-nourished.  HENT:  Head: Normocephalic and atraumatic.  Eyes: Conjunctivae are normal.  Neck: Normal range of motion.  Cardiovascular: Normal rate, regular rhythm, normal heart sounds and intact distal pulses.   Pulmonary/Chest: Effort normal and breath sounds normal. She has no wheezes.  Abdominal: Soft. Bowel sounds are normal. She exhibits no distension. There is tenderness in the right lower quadrant and suprapubic area. There is no rebound and no CVA  tenderness.  No CVA tenderness.  She endorses pressure sensation with palpation in the suprapubic region.  She is tender at her right lower  paralumbar soft tissue region.    Genitourinary: Vagina normal. Cervix exhibits no motion tenderness, no discharge and no friability. Right adnexum displays no mass, no tenderness and no fullness. Left adnexum displays no mass, no tenderness and no fullness. No vaginal discharge found.  Musculoskeletal: Normal range of motion.  Neurological: She is alert.  Skin: Skin is warm and dry.  Psychiatric: She has a normal mood and affect.  Nursing note and vitals reviewed.   ED Course  Procedures (including critical care time) Labs Review Labs Reviewed  WET PREP, GENITAL - Abnormal; Notable for the following:    Clue Cells Wet Prep HPF POC PRESENT (*)    WBC, Wet Prep HPF POC FEW (*)    All other components within normal limits  URINALYSIS, ROUTINE W REFLEX MICROSCOPIC (NOT AT [facility]) - Abnormal; Notable for the following:    APPearance HAZY (*)    Specific Gravity, Urine >1.030 (*)    All other components within normal limits  BASIC METABOLIC PANEL - Abnormal; Notable for the following:    Glucose, Bld 101 (*)    Calcium 8.8 (*)    All other components within normal limits  CBC WITH DIFFERENTIAL/PLATELET - Abnormal; Notable for the following:    Hemoglobin 11.6 (*)    HCT 35.9 (*)    All other components within normal limits  RPR  POC URINE PREG, ED  GC/CHLAMYDIA PROBE AMP ([facility]) NOT AT [facility]    Imaging Review Ct Abdomen Pelvis W Contrast  08/23/2015  CLINICAL DATA:  Right lower quadrant abdominal pain. Vaginal discharge. EXAM: CT ABDOMEN AND PELVIS WITH CONTRAST TECHNIQUE: Multidetector CT imaging of the abdomen and pelvis was performed using the standard protocol following bolus administration of intravenous contrast. CONTRAST:  OMNIPAQUE IOHEXOL 300 MG/ML SOLN, 50mL OMNIPAQUE IOHEXOL 300 MG/ML SOLN COMPARISON:  01/26/2015 CT abdomen/  pelvis. FINDINGS: Lower chest: No significant pulmonary nodules or acute consolidative airspace disease. Oral contrast is seen in the lower thoracic esophagus, suggesting esophageal dysmotility and/ or gastroesophageal reflux. Hepatobiliary: Normal liver with no liver mass. Status post cholecystectomy. No biliary ductal dilatation. Pancreas: Normal, with no mass or duct dilation. Spleen: Normal size. No mass. Adrenals/Urinary Tract: Normal adrenals. Normal kidneys with no hydronephrosis and no renal mass. Normal bladder. Stomach/Bowel: Grossly normal stomach. Normal caliber small bowel with no small bowel wall thickening. Normal appendix. Normal large bowel with no diverticulosis, large bowel wall thickening or pericolonic fat stranding. Vascular/Lymphatic: Normal caliber abdominal aorta. Patent portal, splenic and renal veins. No pathologically enlarged lymph nodes in the abdomen or pelvis. Reproductive: Grossly normal uterus. Simple 2.0 cm right adnexal cyst is in keeping with a physiologic right ovarian follicle. No suspicious adnexal mass. Bilateral tubal ligation clips. Other: No pneumoperitoneum, ascites or focal fluid collection. Musculoskeletal: No aggressive appearing focal osseous lesions. IMPRESSION: 1. No acute abnormality. No evidence of bowel obstruction or acute bowel inflammation. Normal appendix. 2. No suspicious ovarian or adnexal findings. Simple 2.0 cm right adnexal cyst, most in keeping with a physiologic right ovarian follicle, for which no further follow-up is required. This recommendation follows ACR consensus guidelines: White Paper of the ACR Incidental Findings Committee II on Adnexal Findings. J Am Coll Radiol 2013:10:675-681. 3. Oral contrast in the lower thoracic esophagus, suggesting esophageal dysmotility and/or gastroesophageal reflux. Electronically Signed   By: Delbert Phenix M.D.   On: 08/23/2015 12:10   I have personally reviewed and evaluated these images and lab results as part  of my medical decision-making.   EKG Interpretation None      MDM   Final diagnoses:  Cyst of right ovary  Bacterial vaginosis    Pt with right ovarian cyst, normal appendix per CT imaging.  Labs and Ct reviewed and discussed with pt.  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  She was placed on flagyl.  Advised anti - inflammatory, naproxen prescribed, heat tx to pelvis.  Pt endorsed nausea at time of dc, zofran written.  Also offered hydrocodone, pt declined.  Advised f/u with gyn (kaplan) for further management if sx persist. Pt states is due her PAP, will make appt this week.  Pt aware cx pending.  The patient appears reasonably screened and/or stabilized for discharge and I doubt any other medical  condition or other [facility] requiring further screening, evaluation, or treatment in the ED at this time prior to discharge.     Burgess Amor, PA-C 08/24/15 1610  Blane Ohara, MD 08/26/15 [phone]

## 2015-08-23 NOTE — Discharge Instructions (Signed)
Bacterial Vaginosis Bacterial vaginosis is a vaginal infection that occurs when the normal balance of bacteria in the vagina is disrupted. It results from an overgrowth of certain bacteria. This is the most common vaginal infection in women of childbearing age. Treatment is important to prevent complications, especially in pregnant women, as it can cause a premature delivery. CAUSES  Bacterial vaginosis is caused by an increase in harmful bacteria that are normally present in smaller amounts in the vagina. Several different kinds of bacteria can cause bacterial vaginosis. However, the reason that the condition develops is not fully understood. RISK FACTORS Certain activities or behaviors can put you at an increased risk of developing bacterial vaginosis, including:  Having a new sex partner or multiple sex partners.  Douching.  Using an intrauterine device (IUD) for contraception. Women do not get bacterial vaginosis from toilet seats, bedding, swimming pools, or contact with objects around them. SIGNS AND SYMPTOMS  Some women with bacterial vaginosis have no signs or symptoms. Common symptoms include:  Grey vaginal discharge.  A fishlike odor with discharge, especially after sexual intercourse.  Itching or burning of the vagina and vulva.  Burning or pain with urination. DIAGNOSIS  Your health care provider will take a medical history and examine the vagina for signs of bacterial vaginosis. A sample of vaginal fluid may be taken. Your health care provider will look at this sample under a microscope to check for bacteria and abnormal cells. A vaginal pH test may also be done.  TREATMENT  Bacterial vaginosis may be treated with antibiotic medicines. These may be given in the form of a pill or a vaginal cream. A second round of antibiotics may be prescribed if the condition comes back after treatment. Because bacterial vaginosis increases your risk for sexually transmitted diseases, getting  treated can help reduce your risk for chlamydia, gonorrhea, HIV, and herpes. HOME CARE INSTRUCTIONS   Only take over-the-counter or prescription medicines as directed by your health care provider.  If antibiotic medicine was prescribed, take it as directed. Make sure you finish it even if you start to feel better.  Tell all sexual partners that you have a vaginal infection. They should see their health care provider and be treated if they have problems, such as a mild rash or itching.  During treatment, it is important that you follow these instructions:  Avoid sexual activity or use condoms correctly.  Do not douche.  Avoid alcohol as directed by your health care provider.  Avoid breastfeeding as directed by your health care provider. SEEK MEDICAL CARE IF:   Your symptoms are not improving after 3 days of treatment.  You have increased discharge or pain.  You have a fever. MAKE SURE YOU:   Understand these instructions.  Will watch your condition.  Will get help right away if you are not doing well or get worse. FOR MORE INFORMATION  Centers for Disease Control and Prevention, Division of STD Prevention: SolutionApps.co.zawww.cdc.gov/std American Sexual Health Association (ASHA): www.ashastd.org    This information is not intended to replace advice given to you by your health care provider. Make sure you discuss any questions you have with your health care provider.   Document Released: 09/06/2005 Document Revised: 09/27/2014 Document Reviewed: 04/18/2013 Elsevier Interactive Patient Education 2016 [employer]   You may take the hydrocodone prescribed for pain relief.  This will make you drowsy - do not drive within 4 hours of taking this medication.

## 2015-08-23 NOTE — ED Notes (Signed)
Pt c/o pelvic pain that radiates around right lower back since yesterday. Pt states she has had some pressure to pelvic area x 7 days but became extremely worse last night. Pt reports dark/malodorous urine. Pt reports greenish brown vaginal d/c today with nausea.

## 2015-08-23 NOTE — ED Notes (Signed)
Pt states that for the past week she has been having pelvic pain with brown/green vaginal discharge.  Denies odor.  States was last sexually active 9 days ago.

## 2015-08-23 NOTE — ED Notes (Signed)
Pt. D/c papers and prescriptions given and reviewed. Pt. Declined script for Norco/vicodin 5-325mg . Void written across script and discarded in shred bin. Patient has no further questions. Ambulatory with steady gait to lobby to wait for husband.

## 2015-08-23 NOTE — ED Notes (Signed)
Ok to give second dose of Morphine now per PA.

## 2015-08-24 LAB — RPR: RPR Ser Ql: NONREACTIVE

## 2015-08-25 LAB — GC/CHLAMYDIA PROBE AMP (~~LOC~~) NOT AT ARMC
CHLAMYDIA, DNA PROBE: NEGATIVE
Neisseria Gonorrhea: NEGATIVE

## 2015-10-30 ENCOUNTER — Other Ambulatory Visit: Payer: Self-pay | Admitting: Obstetrics & Gynecology

## 2015-10-31 LAB — CYTOLOGY - PAP

## 2016-01-29 ENCOUNTER — Emergency Department (HOSPITAL_COMMUNITY)
Admission: EM | Admit: 2016-01-29 | Discharge: 2016-01-29 | Disposition: A | Discharge status: Home / Self Care | Payer: BLUE CROSS/BLUE SHIELD | Attending: Emergency Medicine | Admitting: Emergency Medicine

## 2016-01-29 ENCOUNTER — Encounter (HOSPITAL_COMMUNITY): Payer: Self-pay | Admitting: *Deleted

## 2016-01-29 DIAGNOSIS — E785 Hyperlipidemia, unspecified: Secondary | ICD-10-CM | POA: Insufficient documentation

## 2016-01-29 DIAGNOSIS — I10 Essential (primary) hypertension: Secondary | ICD-10-CM | POA: Insufficient documentation

## 2016-01-29 DIAGNOSIS — G43009 Migraine without aura, not intractable, without status migrainosus: Secondary | ICD-10-CM

## 2016-01-29 DIAGNOSIS — E119 Type 2 diabetes mellitus without complications: Secondary | ICD-10-CM | POA: Diagnosis not present

## 2016-01-29 DIAGNOSIS — J45909 Unspecified asthma, uncomplicated: Secondary | ICD-10-CM | POA: Insufficient documentation

## 2016-01-29 DIAGNOSIS — Z79899 Other long term (current) drug therapy: Secondary | ICD-10-CM | POA: Insufficient documentation

## 2016-01-29 DIAGNOSIS — G43909 Migraine, unspecified, not intractable, without status migrainosus: Secondary | ICD-10-CM | POA: Insufficient documentation

## 2016-01-29 DIAGNOSIS — Z87891 Personal history of nicotine dependence: Secondary | ICD-10-CM | POA: Insufficient documentation

## 2016-01-29 DIAGNOSIS — R51 Headache: Secondary | ICD-10-CM | POA: Diagnosis present

## 2016-01-29 DIAGNOSIS — F329 Major depressive disorder, single episode, unspecified: Secondary | ICD-10-CM | POA: Diagnosis not present

## 2016-01-29 DIAGNOSIS — Z791 Long term (current) use of non-steroidal anti-inflammatories (NSAID): Secondary | ICD-10-CM | POA: Insufficient documentation

## 2016-01-29 DIAGNOSIS — Z7984 Long term (current) use of oral hypoglycemic drugs: Secondary | ICD-10-CM | POA: Diagnosis not present

## 2016-01-29 MED ORDER — DIPHENHYDRAMINE HCL 50 MG/ML IJ SOLN
25.0000 mg | Freq: Once | INTRAMUSCULAR | Status: AC
  Administered 2016-01-29: 25 mg via INTRAVENOUS
  Filled 2016-01-29: qty 1

## 2016-01-29 MED ORDER — PROCHLORPERAZINE EDISYLATE 5 MG/ML IJ SOLN
10.0000 mg | Freq: Once | INTRAMUSCULAR | Status: AC
  Administered 2016-01-29: 10 mg via INTRAVENOUS
  Filled 2016-01-29: qty 2

## 2016-01-29 MED ORDER — DEXAMETHASONE SODIUM PHOSPHATE 10 MG/ML IJ SOLN
10.0000 mg | Freq: Once | INTRAMUSCULAR | Status: AC
  Administered 2016-01-29: 10 mg via INTRAVENOUS
  Filled 2016-01-29: qty 1

## 2016-01-29 MED ORDER — METOCLOPRAMIDE HCL 5 MG/ML IJ SOLN
10.0000 mg | Freq: Once | INTRAMUSCULAR | Status: AC
  Administered 2016-01-29: 10 mg via INTRAVENOUS
  Filled 2016-01-29: qty 2

## 2016-01-29 MED ORDER — SODIUM CHLORIDE 0.9 % IV BOLUS (SEPSIS)
1000.0000 mL | Freq: Once | INTRAVENOUS | Status: AC
  Administered 2016-01-29: 1000 mL via INTRAVENOUS

## 2016-01-29 MED ORDER — KETOROLAC TROMETHAMINE 30 MG/ML IJ SOLN
30.0000 mg | Freq: Once | INTRAMUSCULAR | Status: AC
  Administered 2016-01-29: 30 mg via INTRAVENOUS
  Filled 2016-01-29: qty 1

## 2016-01-29 NOTE — ED Notes (Signed)
Pt c/o pain to left side of head; EDP made aware

## 2016-01-29 NOTE — ED Notes (Signed)
Pt c/o headache x 3days; pt states she was diagnosed with a sinus infection last week and has one more of the antibiotic to take today; pt states she has taken benadryl, ibuprofen and tylenol with no relief

## 2016-01-29 NOTE — Discharge Instructions (Signed)

## 2016-01-29 NOTE — ED Provider Notes (Signed)
TIME SEEN: 3:30 AM  CHIEF COMPLAINT: Migraine headache  HPI: Pt is a 40 y.o. female with history of hypertension, hyperlipidemia, migraines who is on Topamax who presents to the emergency department with complaints of posterior throbbing headache for the past 3 days worse with light and sounds with associated nausea that feels similar to her prior migraines. History ibuprofen, Tylenol and Benadryl without relief. No head injury. Not on anticoagulation. No numbness, tingling or focal weakness. Does report that week ago she was diagnosed with a sinus infection and has been on amoxicillin. No fever. No neck pain or neck stiffness. Describes as gradual onset, severe. States she has had to go to the hospital in the past for treatment for her headaches. She is not sure what medications helped her before.  ROS: See HPI Constitutional: no fever  Eyes: no drainage  ENT: no runny nose   Cardiovascular:  no chest pain  Resp: no SOB  GI: no vomiting GU: no dysuria Integumentary: no rash  Allergy: no hives  Musculoskeletal: no leg swelling  Neurological: no slurred speech ROS otherwise negative  PAST MEDICAL HISTORY/PAST SURGICAL HISTORY:  Past Medical History  Diagnosis Date  . Hypertension   . Hyperlipidemia   . GERD (gastroesophageal reflux disease)   . Asthma     no inhaler  . Depression   . Diabetes mellitus     on insulin during this pregnancy  . Migraine headache   . Chronic abdominal pain     MEDICATIONS:  Prior to Admission medications   Medication Sig Start Date End Date Taking? Authorizing Provider  acetaminophen (TYLENOL) 500 MG tablet Take 500 mg by mouth every 6 (six) hours as needed for mild pain.   Yes Historical Provider, MD  lisinopril (PRINIVIL,ZESTRIL) 5 MG tablet Take 5 mg by mouth daily.   Yes Historical Provider, MD  naproxen (NAPROSYN) 500 MG tablet Take 1 tablet (500 mg total) by mouth 2 (two) times daily. 08/23/15  Yes Burgess Amor, PA-C  buPROPion (WELLBUTRIN SR)  150 MG 12 hr tablet Take 150 mg by mouth 2 (two) times daily.    Historical Provider, MD  EPINEPHrine 0.3 mg/0.3 mL IJ SOAJ injection Inject 0.3 mg into the muscle once as needed (allergic reaction).  04/09/13   Historical Provider, MD  esomeprazole (NEXIUM) 40 MG capsule Take 1 capsule (40 mg total) by mouth daily. Patient not taking: Reported on 08/23/2015 10/08/14   Roxy Horseman, PA-C  FLUoxetine (PROZAC) 20 MG capsule Take 20 mg by mouth daily. 11/27/14 11/27/15  Historical Provider, MD  ibuprofen (ADVIL,MOTRIN) 200 MG tablet Take 800 mg by mouth every 8 (eight) hours as needed. pain    Historical Provider, MD  metFORMIN (GLUCOPHAGE-XR) 500 MG 24 hr tablet Take 500 mg by mouth 2 (two) times daily.    Historical Provider, MD  omeprazole (PRILOSEC) 40 MG capsule Take 40 mg by mouth 2 (two) times daily.    Historical Provider, MD  simvastatin (ZOCOR) 20 MG tablet Take 20 mg by mouth daily.    Historical Provider, MD  topiramate (TOPAMAX) 200 MG tablet Take 200 mg by mouth 2 (two) times daily.    Historical Provider, MD    ALLERGIES:  Allergies  Allergen Reactions  . Imitrex [Sumatriptan Base] Palpitations  . Levofloxacin Anaphylaxis  . Shellfish Allergy Anaphylaxis and Rash  . Sulfa Antibiotics Anaphylaxis and Rash  . Tequin Anaphylaxis  . Betadine [Povidone Iodine] Rash and Other (See Comments)    Blistering of skin  SOCIAL HISTORY:  Social History  Substance Use Topics  . Smoking status: Former Smoker -- 0.25 packs/day    Quit date: 12/06/2014  . Smokeless tobacco: Never Used  . Alcohol Use: No    FAMILY HISTORY: Family History  Problem Relation Age of Onset  . Heart disease Mother   . COPD Mother   . Hypertension Mother   . Hyperlipidemia Mother   . Hearing loss Maternal Grandmother     EXAM: BP 142/92 mmHg  Pulse 73  Temp(Src) 97.8 F (36.6 C) (Oral)  Resp 20  Ht 5\' 9"  (1.753 m)  Wt 232 lb (105.235 kg)  BMI 34.24 kg/m2  SpO2 100%  LMP  01/25/2016 CONSTITUTIONAL: Alert and oriented and responds appropriately to questions. Well-appearing; well-nourished HEAD: Normocephalic EYES: Conjunctivae clear, PERRL ENT: normal nose; no rhinorrhea; moist mucous membranes; No pharyngeal erythema or petechiae, no tonsillar hypertrophy or exudate, no uvular deviation, no trismus or drooling, normal phonation, no stridor, no dental caries or abscess noted, no Ludwig's angina, tongue sits flat in the bottom of the mouth; TMs are clear bilaterally without erythema, purulence, bulging, perforation, effusion.  No cerumen impaction or sign of foreign body in the external auditory canal. No inflammation, erythema or drainage from the external auditory canal. No signs of mastoiditis. No pain with manipulation of the pinna bilaterally. NECK: Supple, no meningismus, no LAD  CARD: RRR; S1 and S2 appreciated; no murmurs, no clicks, no rubs, no gallops RESP: Normal chest excursion without splinting or tachypnea; breath sounds clear and equal bilaterally; no wheezes, no rhonchi, no rales, no hypoxia or respiratory distress, speaking full sentences ABD/GI: Normal bowel sounds; non-distended; soft, non-tender, no rebound, no guarding, no peritoneal signs BACK:  The back appears normal and is non-tender to palpation, there is no CVA tenderness EXT: Normal ROM in all joints; non-tender to palpation; no edema; normal capillary refill; no cyanosis, no calf tenderness or swelling    SKIN: Normal color for age and race; warm; no rash NEURO: Moves all extremities equally, sensation to light touch intact diffusely, cranial nerves II through XII intact, normal gait  PSYCH: The patient's mood and manner are appropriate. Grooming and personal hygiene are appropriate.  MEDICAL DECISION MAKING: Patient here with migraine headache. Doubt infectious etiology. Doubt stroke, intracranial hemorrhage. Has had similar headaches before. Will treat with migraine cocktail including  Toradol, Reglan, Benadryl, IV fluids and Decadron. At this time I do not feel she needs emergent head imaging.  ED PROGRESS: 4:40 AM  Pt reports her headache has improved but not completely gone. We'll give IV Compazine, another dose of Benadryl.   6:50 AM  Pt's pain is a 2/10. She reports feeling much better and is ready for discharge home. She has a PCP for follow-up. Discussed return precautions.    At this time, I do not feel there is any life-threatening condition present. I have reviewed and discussed all results (EKG, imaging, lab, urine as appropriate), exam findings with patient. I have reviewed nursing notes and appropriate previous records.  I feel the patient is safe to be discharged home without further emergent workup. Discussed usual and customary return precautions. Patient and family (if present) verbalize understanding and are comfortable with this plan.  Patient will follow-up with their primary care provider. If they do not have a primary care provider, information for follow-up has been provided to them. All questions have been answered.   Layla MawKristen N Sigifredo Pignato, DO 01/29/16 [phone]

## 2016-01-29 NOTE — ED Notes (Signed)
Pt given diet coke to drink  

## 2016-03-06 IMAGING — CT CT RENAL STONE PROTOCOL
3 of 4 series · 9 of 46 positions shown, 16 images · non-contrast
Comparison: 10/07/2014

CLINICAL DATA: Right lower quadrant abdominal pain, nausea and
vomiting. Several days duration. Subsequent encounter.

EXAM:
CT ABDOMEN AND PELVIS WITHOUT CONTRAST
TECHNIQUE: Multidetector CT imaging of the abdomen and pelvis was performed
following the standard protocol without IV contrast.

[Series 3: mpr coronal (id) · coronal · 0.82mm/px · 3 of 111 slices shown, 4 images]
[im 37/111  soft-tissue]
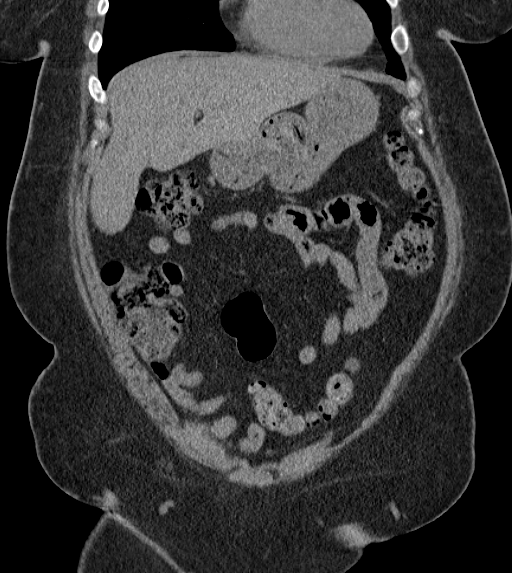
[im 49/111  soft-tissue]
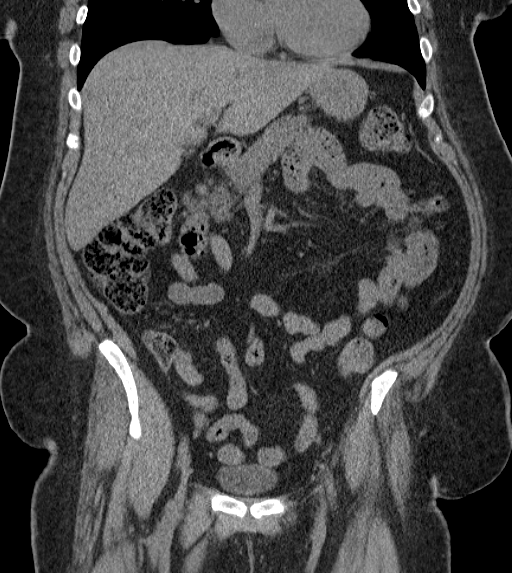
[im 49/111  bone]
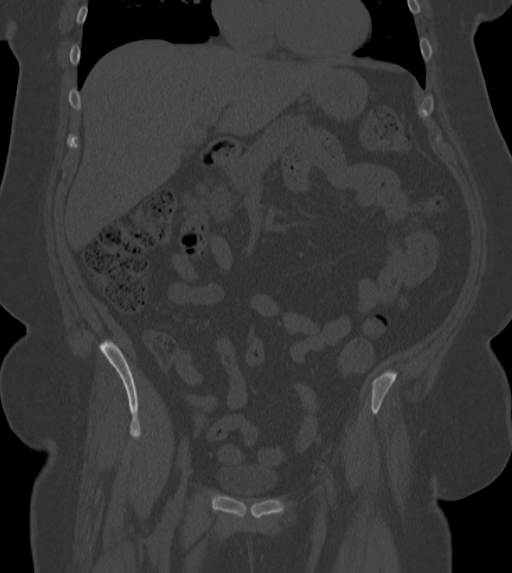
[im 62/111  soft-tissue]
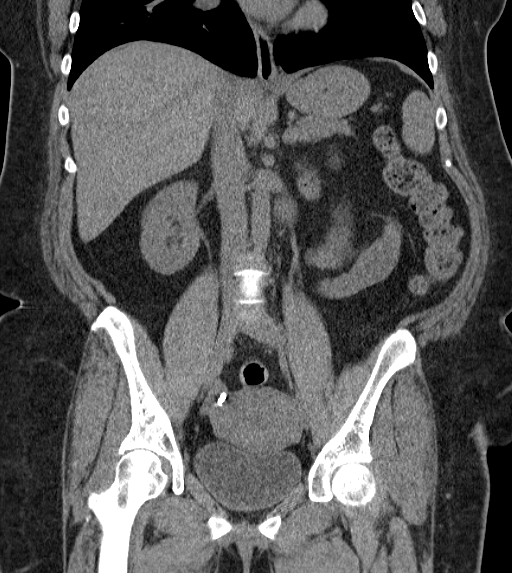

[Series 4: mpr sagittal (id) · sagittal · 0.72mm/px · 1 of 143 slices shown, 2 images]
[im 48/143  soft-tissue]
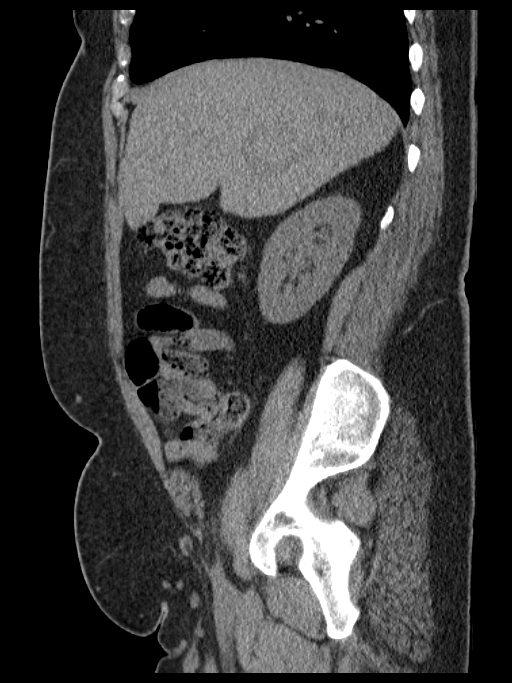
[im 48/143  bone]
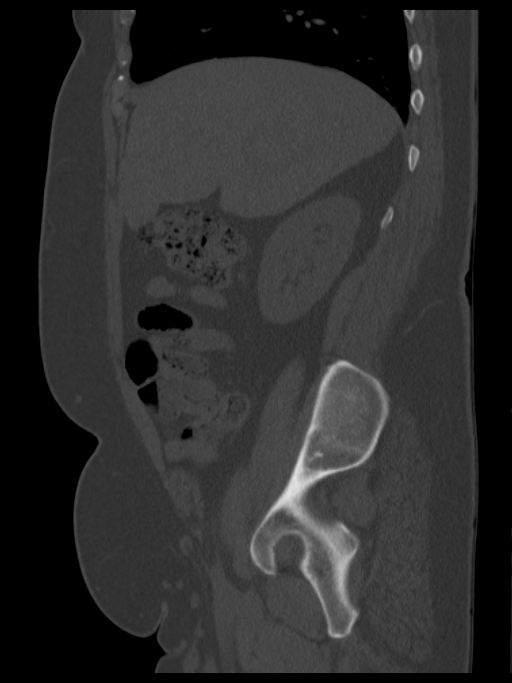

[Series 6: lung 5.0 b60f · axial · 0.86mm/px · z∈[-108,-13]mm · 5 of 29 slices shown, 10 images]
[im 5/29  soft-tissue]
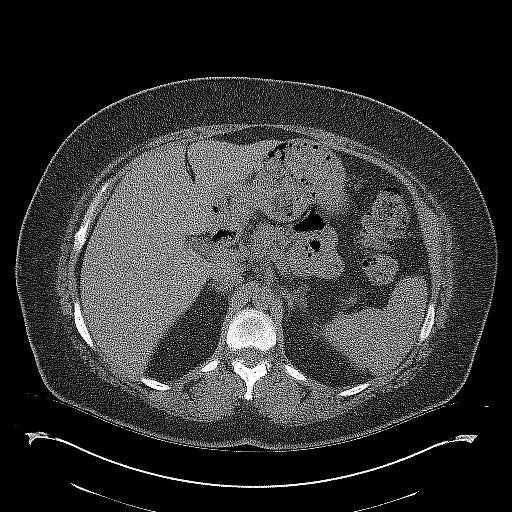
[im 5/29  bone]
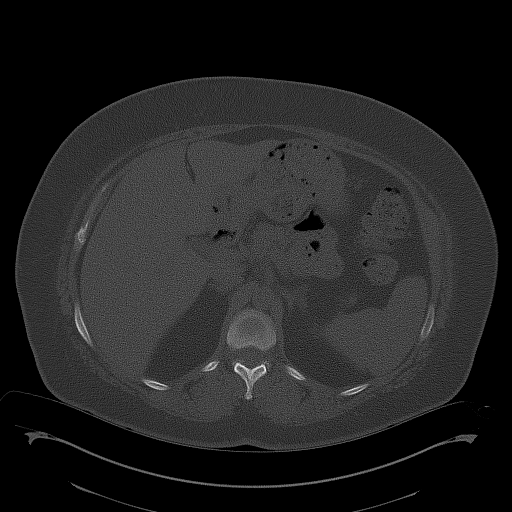
[im 10/29  soft-tissue]
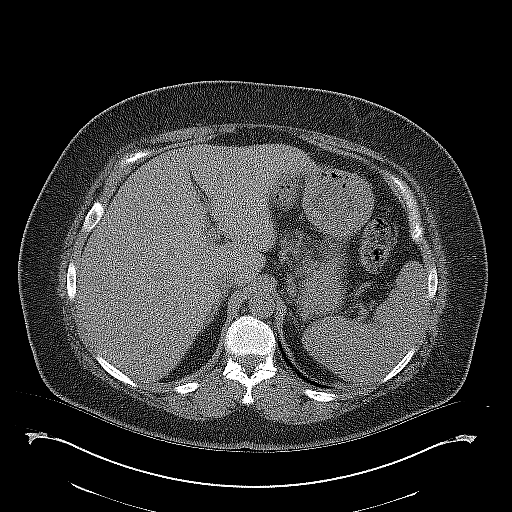
[im 10/29  lung]
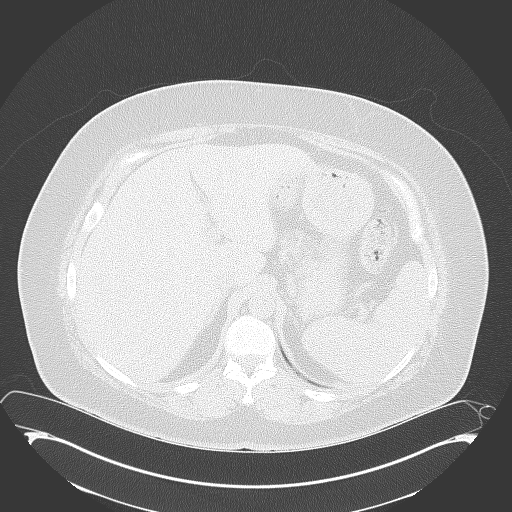
[im 15/29  soft-tissue]
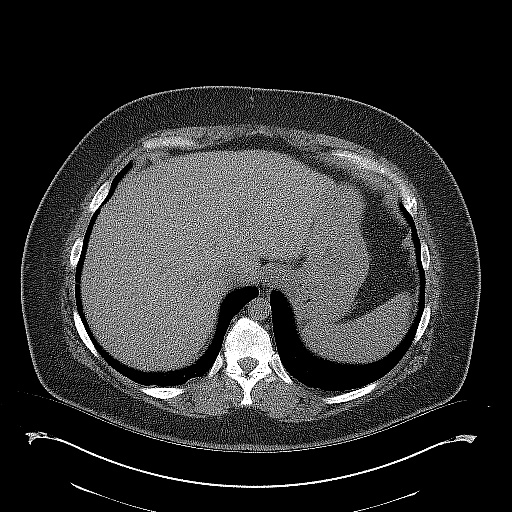
[im 15/29  lung]
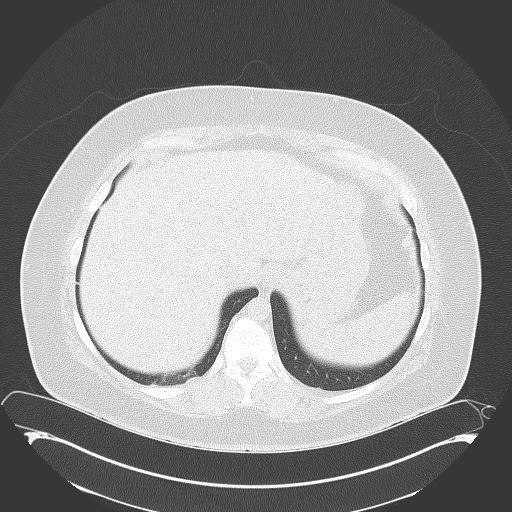
[im 19/29  soft-tissue]
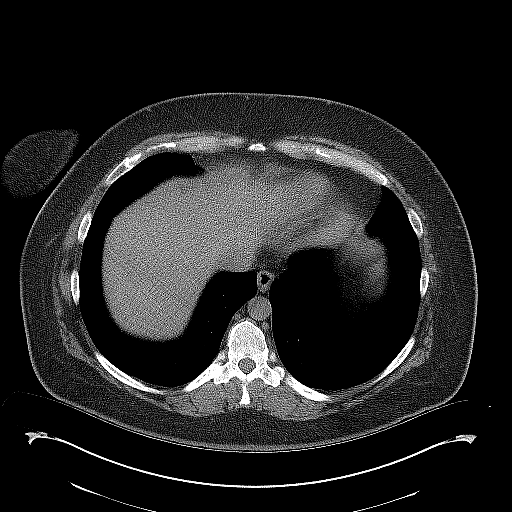
[im 19/29  lung]
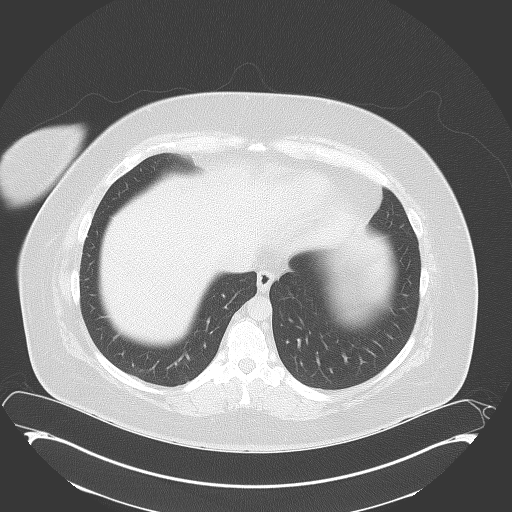
[im 24/29  soft-tissue]
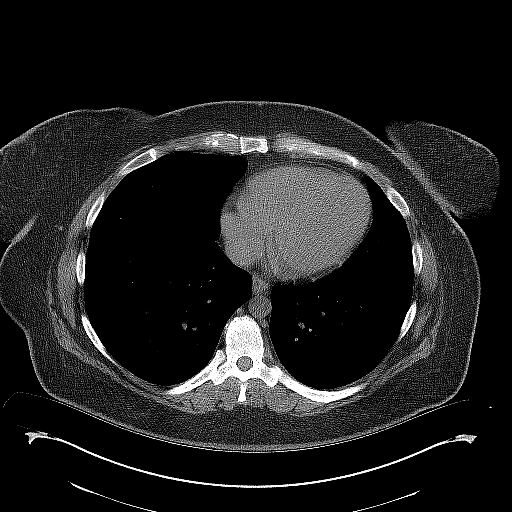
[im 24/29  lung]
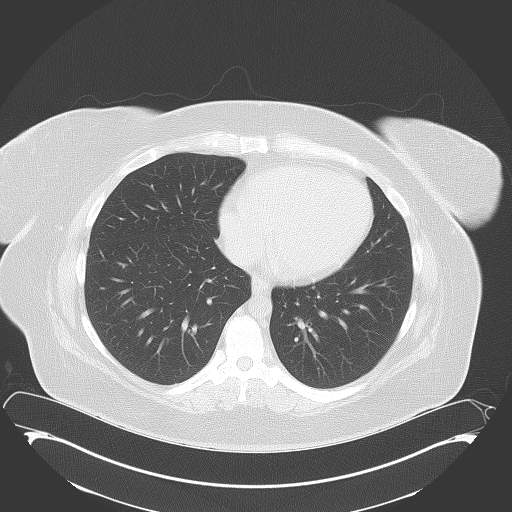

[9 of 46 positions shown; findings below may reference images not displayed]

FINDINGS: The appendix is normal. Remainder of the bowel is remarkable only
for mild uncomplicated colonic diverticulosis. No acute changes are
evident in the abdomen or pelvis.

There is prior cholecystectomy. The liver and bile ducts appear
unremarkable. The pancreas, spleen, adrenals and kidneys appear
unremarkable.

The abdominal aorta is normal in caliber without atherosclerotic
calcification.

Uterus and ovaries appear unremarkable.

There is no significant abnormality in the lower chest.

No significant musculoskeletal abnormality is evident.
IMPRESSION: No acute findings.  Mild diverticulosis.

## 2016-09-18 ENCOUNTER — Emergency Department (HOSPITAL_COMMUNITY)
Admission: EM | Admit: 2016-09-18 | Discharge: 2016-09-19 | Disposition: A | Discharge status: Home / Self Care | Payer: BLUE CROSS/BLUE SHIELD | Attending: Emergency Medicine | Admitting: Emergency Medicine

## 2016-09-18 ENCOUNTER — Encounter (HOSPITAL_COMMUNITY): Payer: Self-pay | Admitting: *Deleted

## 2016-09-18 ENCOUNTER — Emergency Department (HOSPITAL_COMMUNITY): Payer: BLUE CROSS/BLUE SHIELD

## 2016-09-18 DIAGNOSIS — Z87891 Personal history of nicotine dependence: Secondary | ICD-10-CM | POA: Diagnosis not present

## 2016-09-18 DIAGNOSIS — Z79899 Other long term (current) drug therapy: Secondary | ICD-10-CM | POA: Diagnosis not present

## 2016-09-18 DIAGNOSIS — E119 Type 2 diabetes mellitus without complications: Secondary | ICD-10-CM | POA: Diagnosis not present

## 2016-09-18 DIAGNOSIS — Z791 Long term (current) use of non-steroidal anti-inflammatories (NSAID): Secondary | ICD-10-CM | POA: Diagnosis not present

## 2016-09-18 DIAGNOSIS — I10 Essential (primary) hypertension: Secondary | ICD-10-CM | POA: Diagnosis not present

## 2016-09-18 DIAGNOSIS — J45909 Unspecified asthma, uncomplicated: Secondary | ICD-10-CM | POA: Diagnosis not present

## 2016-09-18 DIAGNOSIS — R1031 Right lower quadrant pain: Secondary | ICD-10-CM | POA: Diagnosis present

## 2016-09-18 DIAGNOSIS — Z7984 Long term (current) use of oral hypoglycemic drugs: Secondary | ICD-10-CM | POA: Diagnosis not present

## 2016-09-18 DIAGNOSIS — N3001 Acute cystitis with hematuria: Secondary | ICD-10-CM | POA: Insufficient documentation

## 2016-09-18 LAB — COMPREHENSIVE METABOLIC PANEL
ALBUMIN: 3.8 g/dL (ref 3.5–5.0)
ALT: 27 U/L (ref 14–54)
AST: 19 U/L (ref 15–41)
Alkaline Phosphatase: 66 U/L (ref 38–126)
Anion gap: 5 (ref 5–15)
BUN: 22 mg/dL — ABNORMAL HIGH (ref 6–20)
CHLORIDE: 110 mmol/L (ref 101–111)
CO2: 20 mmol/L — AB (ref 22–32)
CREATININE: 0.9 mg/dL (ref 0.44–1.00)
Calcium: 9.3 mg/dL (ref 8.9–10.3)
GFR calc non Af Amer: >60 mL/min (ref >60)
Glucose, Bld: 172 mg/dL — ABNORMAL HIGH (ref 65–99)
Potassium: 3.5 mmol/L (ref 3.5–5.1)
SODIUM: 135 mmol/L (ref 135–145)
Total Bilirubin: 0.4 mg/dL (ref 0.3–1.2)
Total Protein: 7.1 g/dL (ref 6.5–8.1)

## 2016-09-18 LAB — CBC WITH DIFFERENTIAL/PLATELET
BASOS ABS: 0 10*3/uL (ref 0.0–0.1)
BASOS PCT: 0 %
EOS ABS: 0.3 10*3/uL (ref 0.0–0.7)
EOS PCT: 2 %
HCT: 35.9 % — ABNORMAL LOW (ref 36.0–46.0)
Hemoglobin: 11.5 g/dL — ABNORMAL LOW (ref 12.0–15.0)
Lymphocytes Relative: 24 %
Lymphs Abs: 2.7 10*3/uL (ref 0.7–4.0)
MCH: 27.2 pg (ref 26.0–34.0)
MCHC: 32 g/dL (ref 30.0–36.0)
MCV: 84.9 fL (ref 78.0–100.0)
Monocytes Absolute: 0.7 10*3/uL (ref 0.1–1.0)
Monocytes Relative: 6 %
Neutro Abs: 7.3 10*3/uL (ref 1.7–7.7)
Neutrophils Relative %: 68 %
PLATELETS: 252 10*3/uL (ref 150–400)
RBC: 4.23 MIL/uL (ref 3.87–5.11)
RDW: 15.1 % (ref 11.5–15.5)
WBC: 11 10*3/uL — ABNORMAL HIGH (ref 4.0–10.5)

## 2016-09-18 LAB — URINALYSIS, ROUTINE W REFLEX MICROSCOPIC
BILIRUBIN URINE: NEGATIVE
Glucose, UA: NEGATIVE mg/dL
Ketones, ur: NEGATIVE mg/dL
Nitrite: NEGATIVE
PROTEIN: NEGATIVE mg/dL
Specific Gravity, Urine: 1.017 (ref 1.005–1.030)
pH: 6 (ref 5.0–8.0)

## 2016-09-18 LAB — PREGNANCY, URINE: Preg Test, Ur: NEGATIVE

## 2016-09-18 LAB — LIPASE, BLOOD: Lipase: 33 U/L (ref 11–51)

## 2016-09-18 MED ORDER — IOPAMIDOL (ISOVUE-300) INJECTION 61%
INTRAVENOUS | Status: AC
Start: 2016-09-18 — End: 2016-09-18
  Administered 2016-09-18: 30 mL via ORAL
  Filled 2016-09-18: qty 30

## 2016-09-18 MED ORDER — MORPHINE SULFATE (PF) 4 MG/ML IV SOLN
4.0000 mg | Freq: Once | INTRAVENOUS | Status: AC
Start: 2016-09-18 — End: 2016-09-18
  Administered 2016-09-18: 4 mg via INTRAVENOUS
  Filled 2016-09-18: qty 1

## 2016-09-18 MED ORDER — ONDANSETRON HCL 4 MG/2ML IJ SOLN
4.0000 mg | Freq: Once | INTRAMUSCULAR | Status: AC
  Administered 2016-09-18: 4 mg via INTRAVENOUS
  Filled 2016-09-18: qty 2

## 2016-09-18 NOTE — ED Triage Notes (Signed)
Pt reports RLQ pain with n/v since yesterday. Pt reports loss of appetite.

## 2016-09-19 MED ORDER — IOPAMIDOL (ISOVUE-300) INJECTION 61%
100.0000 mL | Freq: Once | INTRAVENOUS | Status: AC | PRN
  Administered 2016-09-19: 100 mL via INTRAVENOUS

## 2016-09-19 MED ORDER — CEPHALEXIN 500 MG PO CAPS
500.0000 mg | ORAL_CAPSULE | Freq: Once | ORAL | Status: AC
  Administered 2016-09-19: 500 mg via ORAL
  Filled 2016-09-19: qty 1

## 2016-09-19 MED ORDER — HYDROCODONE-ACETAMINOPHEN 5-325 MG PO TABS: 1.0000 | ORAL_TABLET | ORAL | 0 refills | Status: DC | PRN

## 2016-09-19 MED ORDER — CEPHALEXIN 500 MG PO CAPS: 500.0000 mg | ORAL_CAPSULE | Freq: Four times a day (QID) | ORAL | 0 refills | Status: DC

## 2016-09-19 MED ORDER — HYDROMORPHONE HCL 1 MG/ML IJ SOLN
1.0000 mg | Freq: Once | INTRAMUSCULAR | Status: AC
  Administered 2016-09-19: 1 mg via INTRAVENOUS
  Filled 2016-09-19: qty 1

## 2016-09-19 NOTE — ED Provider Notes (Signed)
[facility] Provider Note   CSN: [id number] Arrival date & time: 09/18/16  2203     History   Chief Complaint Chief Complaint  Patient presents with  . Abdominal Pain    HPI [name] is a 40 y.o. female with past medical history significant for present diabetes, asthma and chronic abdominal pain secondary to GERD with nausea and several episodes of nonbloody emesis this morning along with progressively worsening right lower abdominal pain.  She states she had mild "soreness" in the right lower quadrant yesterday evening, which has become progressively more tender today.  In addition, she endorses loss of appetite.  She denies constipation or diarrhea, last bm was today and normal, did not affect her pain.  She does have a history of right ovarian cyst one year ago but states this pain is different.  She has taken tylenol without relief.  The history is provided by the patient.    Past Medical History:  Diagnosis Date  . Asthma    no inhaler  . Chronic abdominal pain   . Depression   . Diabetes mellitus    on insulin during this pregnancy  . GERD (gastroesophageal reflux disease)   . Hyperlipidemia   . Hypertension   . Migraine headache     There are no active problems to display for this patient.   Past Surgical History:  Procedure Laterality Date  . CESAREAN SECTION WITH BILATERAL TUBAL LIGATION  07/18/2012   Procedure: CESAREAN SECTION WITH BILATERAL TUBAL LIGATION;  Surgeon: Mickel Baasichard D Kaplan, MD;  Location: [facility];  Service: Obstetrics;  Laterality: Bilateral;  . CHOLECYSTECTOMY    . WISDOM TOOTH EXTRACTION      OB History    Gravida Para Term Preterm AB Living   3 2 2  0 1 2   SAB TAB Ectopic Multiple Live Births   1 0 0 0 2       Home Medications    Prior to Admission medications   Medication Sig Start Date End Date Taking? Authorizing Provider  acetaminophen (TYLENOL) 500 MG tablet Take 500 mg by mouth every 6 (six) hours as needed for  mild pain.   Yes Historical Provider, MD  buPROPion (WELLBUTRIN SR) 150 MG 12 hr tablet Take 150 mg by mouth 2 (two) times daily.   Yes Historical Provider, MD  EPINEPHrine 0.3 mg/0.3 mL IJ SOAJ injection Inject 0.3 mg into the muscle once as needed (allergic reaction).  04/09/13  Yes Historical Provider, MD  FLUoxetine (PROZAC) 20 MG capsule Take 20 mg by mouth daily. 11/27/14 09/18/16 Yes Historical Provider, MD  ibuprofen (ADVIL,MOTRIN) 200 MG tablet Take 800 mg by mouth every 8 (eight) hours as needed. pain   Yes Historical Provider, MD  lisinopril (PRINIVIL,ZESTRIL) 5 MG tablet Take 5 mg by mouth daily.   Yes Historical Provider, MD  metFORMIN (GLUCOPHAGE-XR) 500 MG 24 hr tablet Take 500 mg by mouth 2 (two) times daily.   Yes Historical Provider, MD  omeprazole (PRILOSEC) 40 MG capsule Take 40 mg by mouth 2 (two) times daily.   Yes Historical Provider, MD  simvastatin (ZOCOR) 20 MG tablet Take 20 mg by mouth daily.   Yes Historical Provider, MD  topiramate (TOPAMAX) 200 MG tablet Take 200 mg by mouth 2 (two) times daily.   Yes Historical Provider, MD  cephALEXin (KEFLEX) 500 MG capsule Take 1 capsule (500 mg total) by mouth 4 (four) times daily. 09/19/16   Burgess AmorJulie Idol, PA-C  HYDROcodone-acetaminophen (NORCO/VICODIN) 5-325 MG  tablet Take 1 tablet by mouth every 4 (four) hours as needed. 09/19/16   Burgess AmorJulie Idol, PA-C    Family History Family History  Problem Relation Age of Onset  . Heart disease Mother   . COPD Mother   . Hypertension Mother   . Hyperlipidemia Mother   . Hearing loss Maternal Grandmother     Social History Social History  Substance Use Topics  . Smoking status: Former Smoker    Packs/day: 0.25    Quit date: 12/06/2014  . Smokeless tobacco: Never Used  . Alcohol use No     Allergies   Imitrex [sumatriptan base]; Levofloxacin; Shellfish allergy; Sulfa antibiotics; Tequin; and Betadine [povidone iodine]   Review of Systems Review of Systems  Constitutional:  Negative for chills and fever.  HENT: Negative for congestion and sore throat.   Eyes: Negative.   Respiratory: Negative for chest tightness and shortness of breath.   Cardiovascular: Negative for chest pain.  Gastrointestinal: Positive for abdominal pain, nausea and vomiting.  Genitourinary: Negative.  Negative for dysuria, flank pain and vaginal discharge.  Musculoskeletal: Negative for arthralgias, joint swelling and neck pain.  Skin: Negative.  Negative for rash and wound.  Neurological: Negative for dizziness, weakness, light-headedness, numbness and headaches.  Psychiatric/Behavioral: Negative.      Physical Exam Updated Vital Signs BP 127/77 (BP Location: Left Arm)   Pulse 83   Temp 98.3 F (36.8 C) (Oral)   Resp 18   Ht 5\' 9"  (1.753 m)   Wt 98.9 kg   LMP 08/27/2016   SpO2 98%   BMI 32.19 kg/m   Physical Exam  Constitutional: She appears well-developed and well-nourished.  HENT:  Head: Normocephalic and atraumatic.  Eyes: Conjunctivae are normal.  Neck: Normal range of motion.  Cardiovascular: Normal rate, regular rhythm, normal heart sounds and intact distal pulses.   Pulmonary/Chest: Effort normal and breath sounds normal. She has no wheezes.  Abdominal: Soft. Bowel sounds are normal. There is no hepatosplenomegaly. There is tenderness in the right lower quadrant. There is tenderness at McBurney's point. There is no rigidity, no guarding and no CVA tenderness.  Musculoskeletal: Normal range of motion.  Neurological: She is alert.  Skin: Skin is warm and dry.  Psychiatric: She has a normal mood and affect.  Nursing note and vitals reviewed.    ED Treatments / Results  Labs (all labs ordered are listed, but only abnormal results are displayed) Labs Reviewed  CBC WITH DIFFERENTIAL/PLATELET - Abnormal; Notable for the following:       Result Value   WBC 11.0 (*)    Hemoglobin 11.5 (*)    HCT 35.9 (*)    All other components within normal limits    COMPREHENSIVE METABOLIC PANEL - Abnormal; Notable for the following:    CO2 20 (*)    Glucose, Bld 172 (*)    BUN 22 (*)    All other components within normal limits  URINALYSIS, ROUTINE W REFLEX MICROSCOPIC - Abnormal; Notable for the following:    APPearance HAZY (*)    Hgb urine dipstick SMALL (*)    Leukocytes, UA LARGE (*)    Bacteria, UA MANY (*)    All other components within normal limits  URINE CULTURE  LIPASE, BLOOD  PREGNANCY, URINE    EKG  EKG Interpretation None       Radiology Ct Abdomen Pelvis W Contrast  Result Date: 09/19/2016 CLINICAL DATA:  Right lower quadrant pain. Nausea and vomiting since yesterday. EXAM: CT ABDOMEN  AND PELVIS WITH CONTRAST TECHNIQUE: Multidetector CT imaging of the abdomen and pelvis was performed using the standard protocol following bolus administration of intravenous contrast. CONTRAST:  ISOVUE-300 IOPAMIDOL (ISOVUE-300) INJECTION 61% COMPARISON:  CT 08/23/2015 FINDINGS: Lower chest: The lung bases are clear. Hepatobiliary: No focal hepatic lesion. Postcholecystectomy with stable mild intrahepatic biliary prominence. Normal common bile duct postcholecystectomy of 7 mm. Pancreas: No ductal dilatation or inflammation. Spleen: Normal in size without focal abnormality. Adrenals/Urinary Tract: Adrenal glands are unremarkable. Kidneys are normal, without renal calculi, focal lesion, or hydronephrosis. Bladder is unremarkable. Stomach/Bowel: The stomach is physiologically distended with ingested contrast. No bowel wall thickening, inflammation or distention. Scattered radiopaque densities throughout the colon are likely ingested pills. The appendix is normal. No periappendiceal inflammation. Vascular/Lymphatic: No significant vascular findings are present. No enlarged abdominal or pelvic lymph nodes. Reproductive: Caesarean section scar in the uterus. Bilateral tubal ligation clips. Right ovary is normal in size. There is a 2.6 cm cyst in the  left ovary with an adjacent corpus luteum. Other: No free air, free fluid, or intra-abdominal fluid collection. Musculoskeletal: There are no acute or suspicious osseous abnormalities. IMPRESSION: 1. No acute abnormality in the abdomen/pelvis to explain right lower quadrant pain. Normal appendix. 2. Physiologic follicular cyst in the left ovary measures 2.6 cm. No dedicated further follow-up is needed. Electronically Signed   By: Rubye Oaks M.D.   On: 09/19/2016 00:48    Procedures Procedures (including critical care time)  Medications Ordered in ED Medications  cephALEXin (KEFLEX) capsule 500 mg (not administered)  ondansetron (ZOFRAN) injection 4 mg (4 mg Intravenous Given 09/18/16 2318)  morphine 4 MG/ML injection 4 mg (4 mg Intravenous Given 09/18/16 2318)  iopamidol (ISOVUE-300) 61 % injection (30 mLs Oral Contrast Given 09/18/16 2315)  iopamidol (ISOVUE-300) 61 % injection 100 mL (100 mLs Intravenous Contrast Given 09/19/16 0023)  HYDROmorphone (DILAUDID) injection 1 mg (1 mg Intravenous Given 09/19/16 0041)     Initial Impression / Assessment and Plan / ED Course  I have reviewed the triage vital signs and the nursing notes.  Pertinent labs & imaging results that were available during my care of the patient were reviewed by me and considered in my medical decision making (see chart for details).  Clinical Course     Pt with rlq pain, no flank or cva pain with uti.  ? If infection is trying to invade the right ureter causing increased pain on this side.  She was given IV fluids, zofran and morphine with transient relief of pain, nausea resolved.  Added dilaudid with complete pain relief.  She was able to tolerate PO intake after she returned from ct.  Stated on keflex, first dose given here.  She has routine f/u with her pcp in 3 days, advised to keep this appt.   Final Clinical Impressions(s) / ED Diagnoses   Final diagnoses:  Acute cystitis with hematuria    New  Prescriptions New Prescriptions   CEPHALEXIN (KEFLEX) 500 MG CAPSULE    Take 1 capsule (500 mg total) by mouth 4 (four) times daily.   HYDROCODONE-ACETAMINOPHEN (NORCO/VICODIN) 5-325 MG TABLET    Take 1 tablet by mouth every 4 (four) hours as needed.     Burgess Amor, PA-C 09/19/16 0123    Layla Maw Arlisha Patalano, DO 09/19/16 [id number]

## 2016-09-19 NOTE — ED Provider Notes (Signed)
Medical screening examination/treatment/procedure(s) were performed by non-physician practitioner and as supervising physician I was immediately available for consultation/collaboration.   EKG Interpretation None         Layla MawKristen N Jerrod Damiano, DO 09/19/16 0234

## 2016-09-21 LAB — URINE CULTURE: Culture: NO GROWTH

## 2016-09-22 MED FILL — Hydrocodone-Acetaminophen Tab 5-325 MG: ORAL | Qty: 6 | Status: AC

## 2016-11-02 ENCOUNTER — Emergency Department (HOSPITAL_COMMUNITY): Payer: BLUE CROSS/BLUE SHIELD

## 2016-11-02 ENCOUNTER — Emergency Department (HOSPITAL_COMMUNITY)
Admission: EM | Admit: 2016-11-02 | Discharge: 2016-11-02 | Disposition: A | Discharge status: Home / Self Care | Payer: BLUE CROSS/BLUE SHIELD | Attending: Emergency Medicine | Admitting: Emergency Medicine

## 2016-11-02 ENCOUNTER — Encounter (HOSPITAL_COMMUNITY): Payer: Self-pay | Admitting: *Deleted

## 2016-11-02 DIAGNOSIS — Z87891 Personal history of nicotine dependence: Secondary | ICD-10-CM | POA: Diagnosis not present

## 2016-11-02 DIAGNOSIS — E119 Type 2 diabetes mellitus without complications: Secondary | ICD-10-CM | POA: Insufficient documentation

## 2016-11-02 DIAGNOSIS — Z7984 Long term (current) use of oral hypoglycemic drugs: Secondary | ICD-10-CM | POA: Insufficient documentation

## 2016-11-02 DIAGNOSIS — I10 Essential (primary) hypertension: Secondary | ICD-10-CM | POA: Diagnosis not present

## 2016-11-02 DIAGNOSIS — Z79899 Other long term (current) drug therapy: Secondary | ICD-10-CM | POA: Insufficient documentation

## 2016-11-02 DIAGNOSIS — R1031 Right lower quadrant pain: Secondary | ICD-10-CM | POA: Diagnosis present

## 2016-11-02 DIAGNOSIS — J45909 Unspecified asthma, uncomplicated: Secondary | ICD-10-CM | POA: Insufficient documentation

## 2016-11-02 LAB — URINALYSIS, ROUTINE W REFLEX MICROSCOPIC
BILIRUBIN URINE: NEGATIVE
GLUCOSE, UA: NEGATIVE mg/dL
Hgb urine dipstick: NEGATIVE
KETONES UR: NEGATIVE mg/dL
Nitrite: NEGATIVE
PROTEIN: NEGATIVE mg/dL
Specific Gravity, Urine: 1.029 (ref 1.005–1.030)
WBC, UA: NONE SEEN WBC/hpf (ref 0–5)
pH: 5 (ref 5.0–8.0)

## 2016-11-02 LAB — CBC WITH DIFFERENTIAL/PLATELET
BASOS PCT: 0 %
Basophils Absolute: 0 10*3/uL (ref 0.0–0.1)
EOS ABS: 0.2 10*3/uL (ref 0.0–0.7)
EOS PCT: 3 %
HCT: 38 % (ref 36.0–46.0)
HEMOGLOBIN: 11.8 g/dL — AB (ref 12.0–15.0)
Lymphocytes Relative: 21 %
Lymphs Abs: 2.1 10*3/uL (ref 0.7–4.0)
MCH: 26 pg (ref 26.0–34.0)
MCHC: 31.1 g/dL (ref 30.0–36.0)
MCV: 83.7 fL (ref 78.0–100.0)
MONOS PCT: 6 %
Monocytes Absolute: 0.6 10*3/uL (ref 0.1–1.0)
NEUTROS PCT: 70 %
Neutro Abs: 6.7 10*3/uL (ref 1.7–7.7)
PLATELETS: 290 10*3/uL (ref 150–400)
RBC: 4.54 MIL/uL (ref 3.87–5.11)
RDW: 15.1 % (ref 11.5–15.5)
WBC: 9.6 10*3/uL (ref 4.0–10.5)

## 2016-11-02 LAB — COMPREHENSIVE METABOLIC PANEL
ALBUMIN: 4 g/dL (ref 3.5–5.0)
ALK PHOS: 66 U/L (ref 38–126)
ALT: 19 U/L (ref 14–54)
ANION GAP: 7 (ref 5–15)
AST: 16 U/L (ref 15–41)
BUN: 19 mg/dL (ref 6–20)
CALCIUM: 9.2 mg/dL (ref 8.9–10.3)
CHLORIDE: 105 mmol/L (ref 101–111)
CO2: 24 mmol/L (ref 22–32)
Creatinine, Ser: 0.79 mg/dL (ref 0.44–1.00)
GFR calc Af Amer: >60 mL/min (ref >60)
GFR calc non Af Amer: >60 mL/min (ref >60)
GLUCOSE: 125 mg/dL — AB (ref 65–99)
POTASSIUM: 4.1 mmol/L (ref 3.5–5.1)
SODIUM: 136 mmol/L (ref 135–145)
Total Bilirubin: 0.7 mg/dL (ref 0.3–1.2)
Total Protein: 7.2 g/dL (ref 6.5–8.1)

## 2016-11-02 LAB — LIPASE, BLOOD: Lipase: 24 U/L (ref 11–51)

## 2016-11-02 LAB — PREGNANCY, URINE: Preg Test, Ur: NEGATIVE

## 2016-11-02 MED ORDER — MORPHINE SULFATE (PF) 4 MG/ML IV SOLN
4.0000 mg | Freq: Once | INTRAVENOUS | Status: AC
  Administered 2016-11-02: 4 mg via INTRAVENOUS
  Filled 2016-11-02: qty 1

## 2016-11-02 MED ORDER — IOPAMIDOL (ISOVUE-300) INJECTION 61%
100.0000 mL | Freq: Once | INTRAVENOUS | Status: AC | PRN
  Administered 2016-11-02: 100 mL via INTRAVENOUS

## 2016-11-02 MED ORDER — HYDROMORPHONE HCL 1 MG/ML IJ SOLN
1.0000 mg | Freq: Once | INTRAMUSCULAR | Status: AC
Start: 2016-11-02 — End: 2016-11-02
  Administered 2016-11-02: 1 mg via INTRAVENOUS
  Filled 2016-11-02: qty 1

## 2016-11-02 MED ORDER — ONDANSETRON HCL 4 MG/2ML IJ SOLN
4.0000 mg | Freq: Once | INTRAMUSCULAR | Status: AC
  Administered 2016-11-02: 4 mg via INTRAVENOUS

## 2016-11-02 MED ORDER — DICYCLOMINE HCL 20 MG PO TABS: 20.0000 mg | ORAL_TABLET | Freq: Two times a day (BID) | ORAL | 0 refills | Status: DC | PRN

## 2016-11-02 MED ORDER — HYDROMORPHONE HCL 1 MG/ML IJ SOLN
1.0000 mg | Freq: Once | INTRAMUSCULAR | Status: DC
Start: 2016-11-02 — End: 2016-11-02

## 2016-11-02 MED ORDER — SODIUM CHLORIDE 0.9 % IV BOLUS (SEPSIS)
1000.0000 mL | Freq: Once | INTRAVENOUS | Status: AC
  Administered 2016-11-02: 1000 mL via INTRAVENOUS

## 2016-11-02 MED ORDER — ONDANSETRON HCL 4 MG PO TABS: 4.0000 mg | ORAL_TABLET | Freq: Four times a day (QID) | ORAL | 0 refills | Status: DC | PRN

## 2016-11-02 MED ORDER — ONDANSETRON HCL 4 MG/2ML IJ SOLN
INTRAMUSCULAR | Status: AC
  Administered 2016-11-02: 4 mg via INTRAVENOUS
  Filled 2016-11-02: qty 2

## 2016-11-02 MED ORDER — ONDANSETRON HCL 4 MG/2ML IJ SOLN
4.0000 mg | Freq: Once | INTRAMUSCULAR | Status: AC
  Administered 2016-11-02: 4 mg via INTRAVENOUS
  Filled 2016-11-02: qty 2

## 2016-11-02 NOTE — ED Provider Notes (Signed)
Patient's abdominal pain is improved. Abdominal exam reveals some persistent right lower quadrant tenderness with deep palpation. There is no rebound or guarding. Patient's been seen multiple times in the emergency department with right lower quadrant pain dating back to 2005. She's had 19 CT scans over that period. Most indications appear to be for right lower quadrant abdominal pain. States she's never seen a gastroenterologist. CT scan today with left ovarian cysts. We'll get a pelvic ultrasound but believe this will be of low yield. I have warned patient about the risk of radiation exposure from multiple scans. Patient will need to follow-up with outpatient gastroenterologist and OB/GYN.   Patient became very upset at the suggested that her abdominal pain was chronic and need outpatient workup by specialist. Pelvic ultrasound without any abnormalities. Patient will be discharged home with Bentyl and Zofran. Understands the need to return for worsening symptoms, persistent vomiting, fever or for any concerns.   Loren Raceravid Tushar Enns, MD 11/02/16 1040

## 2016-11-02 NOTE — ED Triage Notes (Signed)
Pt c/o right lower abdominal pain that radiates to her back; pt denies any urinary sx

## 2016-11-02 NOTE — ED Provider Notes (Signed)
[facility] Provider Note   CSN: [id number] Arrival date & time: 11/02/16  0315     History   Chief Complaint Chief Complaint  Patient presents with  . Abdominal Pain    HPI [name] is a 41 y.o. female.  HPI  This a 41 year old female who presents with right lower quadrant pain. She reports a 2 day history of worsening right lower quadrant pain. It at times radiates to the right back. She states she had similar symptoms in December and was diagnosed with urinary tract infection. She denies urgency, frequency, dysuria. She denies fevers. She states the pain is sharp. It is currently 8 out of 10. It is not improved with ibuprofen or Tylenol. She denies vaginal discharge.  Past Medical History:  Diagnosis Date  . Asthma    no inhaler  . Chronic abdominal pain   . Depression   . Diabetes mellitus    on insulin during this pregnancy  . GERD (gastroesophageal reflux disease)   . Hyperlipidemia   . Hypertension   . Migraine headache     There are no active problems to display for this patient.   Past Surgical History:  Procedure Laterality Date  . CESAREAN SECTION WITH BILATERAL TUBAL LIGATION  07/18/2012   Procedure: CESAREAN SECTION WITH BILATERAL TUBAL LIGATION;  Surgeon: Mickel Baasichard D Kaplan, MD;  Location: [facility];  Service: Obstetrics;  Laterality: Bilateral;  . CHOLECYSTECTOMY    . WISDOM TOOTH EXTRACTION      OB History    Gravida Para Term Preterm AB Living   3 2 2  0 1 2   SAB TAB Ectopic Multiple Live Births   1 0 0 0 2       Home Medications    Prior to Admission medications   Medication Sig Start Date End Date Taking? Authorizing Provider  acetaminophen (TYLENOL) 500 MG tablet Take 500 mg by mouth every 6 (six) hours as needed for mild pain.    Historical Provider, MD  buPROPion (WELLBUTRIN SR) 150 MG 12 hr tablet Take 150 mg by mouth 2 (two) times daily.    Historical Provider, MD  cephALEXin (KEFLEX) 500 MG capsule Take 1 capsule (500  mg total) by mouth 4 (four) times daily. 09/19/16   Burgess AmorJulie Idol, PA-C  EPINEPHrine 0.3 mg/0.3 mL IJ SOAJ injection Inject 0.3 mg into the muscle once as needed (allergic reaction).  04/09/13   Historical Provider, MD  FLUoxetine (PROZAC) 20 MG capsule Take 20 mg by mouth daily. 11/27/14 09/18/16  Historical Provider, MD  HYDROcodone-acetaminophen (NORCO/VICODIN) 5-325 MG tablet Take 1 tablet by mouth every 4 (four) hours as needed. 09/19/16   Burgess AmorJulie Idol, PA-C  ibuprofen (ADVIL,MOTRIN) 200 MG tablet Take 800 mg by mouth every 8 (eight) hours as needed. pain    Historical Provider, MD  lisinopril (PRINIVIL,ZESTRIL) 5 MG tablet Take 5 mg by mouth daily.    Historical Provider, MD  metFORMIN (GLUCOPHAGE-XR) 500 MG 24 hr tablet Take 500 mg by mouth 2 (two) times daily.    Historical Provider, MD  omeprazole (PRILOSEC) 40 MG capsule Take 40 mg by mouth 2 (two) times daily.    Historical Provider, MD  simvastatin (ZOCOR) 20 MG tablet Take 20 mg by mouth daily.    Historical Provider, MD  topiramate (TOPAMAX) 200 MG tablet Take 200 mg by mouth 2 (two) times daily.    Historical Provider, MD    Family History Family History  Problem Relation Age of Onset  . Heart  disease Mother   . COPD Mother   . Hypertension Mother   . Hyperlipidemia Mother   . Hearing loss Maternal Grandmother     Social History Social History  Substance Use Topics  . Smoking status: Former Smoker    Packs/day: 0.25    Quit date: 12/06/2014  . Smokeless tobacco: Never Used  . Alcohol use No     Allergies   Imitrex [sumatriptan base]; Levofloxacin; Shellfish allergy; Sulfa antibiotics; Tequin; and Betadine [povidone iodine]   Review of Systems Review of Systems  Constitutional: Negative for fever.  Respiratory: Negative for shortness of breath.   Cardiovascular: Negative for chest pain.  Gastrointestinal: Positive for abdominal pain. Negative for nausea and vomiting.  Genitourinary: Negative for dysuria, frequency and  urgency.  Musculoskeletal: Negative for back pain.  All other systems reviewed and are negative.    Physical Exam Updated Vital Signs BP 180/89 (BP Location: Left Arm)   Pulse 62   Temp 98.5 F (36.9 C) (Oral)   Resp 17   Ht 5\' 9"  (1.753 m)   Wt 220 lb (99.8 kg)   SpO2 100%   BMI 32.49 kg/m   Physical Exam  Constitutional: She is oriented to person, place, and time. She appears well-developed and well-nourished. No distress.  HENT:  Head: Normocephalic and atraumatic.  Cardiovascular: Normal rate, regular rhythm and normal heart sounds.   Pulmonary/Chest: Effort normal. No respiratory distress. She has no wheezes.  Abdominal: Soft. Bowel sounds are normal. She exhibits no mass. There is tenderness. There is no rebound and no guarding.  Right lower quadrant tenderness to palpation without rebound or guarding  Neurological: She is alert and oriented to person, place, and time.  Skin: Skin is warm and dry.  Psychiatric: She has a normal mood and affect.  Nursing note and vitals reviewed.    ED Treatments / Results  Labs (all labs ordered are listed, but only abnormal results are displayed) Labs Reviewed  URINALYSIS, ROUTINE W REFLEX MICROSCOPIC - Abnormal; Notable for the following:       Result Value   APPearance HAZY (*)    Leukocytes, UA TRACE (*)    Bacteria, UA FEW (*)    All other components within normal limits  CBC WITH DIFFERENTIAL/PLATELET - Abnormal; Notable for the following:    Hemoglobin 11.8 (*)    All other components within normal limits  COMPREHENSIVE METABOLIC PANEL - Abnormal; Notable for the following:    Glucose, Bld 125 (*)    All other components within normal limits  PREGNANCY, URINE  LIPASE, BLOOD    EKG  EKG Interpretation None       Radiology No results found.  Procedures Procedures (including critical care time)  Medications Ordered in ED Medications  sodium chloride 0.9 % bolus 1,000 mL (0 mLs Intravenous Stopped 11/02/16  0659)  morphine 4 MG/ML injection 4 mg (4 mg Intravenous Given 11/02/16 0541)  ondansetron (ZOFRAN) injection 4 mg (4 mg Intravenous Given 11/02/16 0541)  iopamidol (ISOVUE-300) 61 % injection 100 mL (100 mLs Intravenous Contrast Given 11/02/16 0728)  morphine 4 MG/ML injection 4 mg (4 mg Intravenous Given 11/02/16 0714)     Initial Impression / Assessment and Plan / ED Course  I have reviewed the triage vital signs and the nursing notes.  Pertinent labs & imaging results that were available during my care of the patient were reviewed by me and considered in my medical decision making (see chart for details).     Presents  with right lower quadrant pain. Similar symptoms in the past. Lab work is largely reassuring. No evidence of urinary tract infection. Do not have access to ultrasound at this time. Ovarian pathology is a consideration as well as appendicitis. She has had several prior CT scans. However, she has continued pain. Will obtain CT. If this is negative and pain persists, would elect to obtain abdominal ultrasound to rule out ovarian pathology or torsion.  Final Clinical Impressions(s) / ED Diagnoses   Final diagnoses:  RLQ abdominal pain    New Prescriptions New Prescriptions   No medications on file     Shon Baton, MD 11/02/16 [phone]

## 2016-12-25 ENCOUNTER — Emergency Department (HOSPITAL_COMMUNITY)
Admission: EM | Admit: 2016-12-25 | Discharge: 2016-12-25 | Disposition: A | Discharge status: Home / Self Care | Payer: BLUE CROSS/BLUE SHIELD | Attending: Emergency Medicine | Admitting: Emergency Medicine

## 2016-12-25 ENCOUNTER — Emergency Department (HOSPITAL_COMMUNITY): Payer: BLUE CROSS/BLUE SHIELD

## 2016-12-25 ENCOUNTER — Encounter (HOSPITAL_COMMUNITY): Payer: Self-pay | Admitting: Emergency Medicine

## 2016-12-25 DIAGNOSIS — I10 Essential (primary) hypertension: Secondary | ICD-10-CM | POA: Insufficient documentation

## 2016-12-25 DIAGNOSIS — Z79899 Other long term (current) drug therapy: Secondary | ICD-10-CM | POA: Insufficient documentation

## 2016-12-25 DIAGNOSIS — R0789 Other chest pain: Secondary | ICD-10-CM | POA: Diagnosis not present

## 2016-12-25 DIAGNOSIS — Z7984 Long term (current) use of oral hypoglycemic drugs: Secondary | ICD-10-CM | POA: Diagnosis not present

## 2016-12-25 DIAGNOSIS — R072 Precordial pain: Secondary | ICD-10-CM | POA: Diagnosis present

## 2016-12-25 DIAGNOSIS — E119 Type 2 diabetes mellitus without complications: Secondary | ICD-10-CM | POA: Insufficient documentation

## 2016-12-25 DIAGNOSIS — J45909 Unspecified asthma, uncomplicated: Secondary | ICD-10-CM | POA: Diagnosis not present

## 2016-12-25 DIAGNOSIS — F17210 Nicotine dependence, cigarettes, uncomplicated: Secondary | ICD-10-CM | POA: Insufficient documentation

## 2016-12-25 LAB — COMPREHENSIVE METABOLIC PANEL
ALK PHOS: 59 U/L (ref 38–126)
ALT: 19 U/L (ref 14–54)
AST: 18 U/L (ref 15–41)
Albumin: 3.7 g/dL (ref 3.5–5.0)
Anion gap: 8 (ref 5–15)
BILIRUBIN TOTAL: 0.2 mg/dL — AB (ref 0.3–1.2)
BUN: 19 mg/dL (ref 6–20)
CALCIUM: 9.1 mg/dL (ref 8.9–10.3)
CO2: 22 mmol/L (ref 22–32)
CREATININE: 0.86 mg/dL (ref 0.44–1.00)
Chloride: 107 mmol/L (ref 101–111)
Glucose, Bld: 210 mg/dL — ABNORMAL HIGH (ref 65–99)
Potassium: 3.8 mmol/L (ref 3.5–5.1)
Sodium: 137 mmol/L (ref 135–145)
Total Protein: 6.9 g/dL (ref 6.5–8.1)

## 2016-12-25 LAB — CBC
HCT: 36 % (ref 36.0–46.0)
Hemoglobin: 11.6 g/dL — ABNORMAL LOW (ref 12.0–15.0)
MCH: 27.6 pg (ref 26.0–34.0)
MCHC: 32.2 g/dL (ref 30.0–36.0)
MCV: 85.5 fL (ref 78.0–100.0)
PLATELETS: 312 10*3/uL (ref 150–400)
RBC: 4.21 MIL/uL (ref 3.87–5.11)
RDW: 15.6 % — AB (ref 11.5–15.5)
WBC: 13.8 10*3/uL — ABNORMAL HIGH (ref 4.0–10.5)

## 2016-12-25 LAB — D-DIMER, QUANTITATIVE (NOT AT ARMC)

## 2016-12-25 LAB — TROPONIN I

## 2016-12-25 MED ORDER — MORPHINE SULFATE (PF) 2 MG/ML IV SOLN
2.0000 mg | Freq: Once | INTRAVENOUS | Status: AC
  Administered 2016-12-25: 2 mg via INTRAVENOUS
  Filled 2016-12-25: qty 1

## 2016-12-25 MED ORDER — HYDROCODONE-ACETAMINOPHEN 5-325 MG PO TABS: 1.0000 | ORAL_TABLET | Freq: Four times a day (QID) | ORAL | 0 refills | Status: DC | PRN

## 2016-12-25 MED ORDER — OXYCODONE-ACETAMINOPHEN 5-325 MG PO TABS
1.0000 | ORAL_TABLET | Freq: Once | ORAL | Status: AC
  Administered 2016-12-25: 1 via ORAL
  Filled 2016-12-25: qty 1

## 2016-12-25 MED ORDER — ONDANSETRON HCL 4 MG/2ML IJ SOLN
4.0000 mg | Freq: Once | INTRAMUSCULAR | Status: AC
  Administered 2016-12-25: 4 mg via INTRAVENOUS
  Filled 2016-12-25: qty 2

## 2016-12-25 MED ORDER — GI COCKTAIL ~~LOC~~
30.0000 mL | Freq: Once | ORAL | Status: AC
  Administered 2016-12-25: 30 mL via ORAL
  Filled 2016-12-25: qty 30

## 2016-12-25 MED ORDER — PANTOPRAZOLE SODIUM 40 MG IV SOLR
40.0000 mg | Freq: Once | INTRAVENOUS | Status: AC
  Administered 2016-12-25: 40 mg via INTRAVENOUS
  Filled 2016-12-25: qty 40

## 2016-12-25 NOTE — Discharge Instructions (Signed)
Stop taking the prednisone. Follow-up with your doctor next week

## 2016-12-25 NOTE — ED Triage Notes (Signed)
Pt reports being put on steroids yesterday for right shoulder pain.  About 2 hours pta began having chest pain moving into left shoulder and upper back with nausea and vomiting x2.

## 2016-12-25 NOTE — ED Provider Notes (Signed)
[facility] Provider Note   CSN: [id number] Arrival date & time: 12/25/16  1853     History   Chief Complaint Chief Complaint  Patient presents with  . Chest Pain    HPI [name] is a 41 y.o. female.  Patient states that she started on prednisone for pain. And now is having some fullness in her chest.   The history is provided by the patient. No language interpreter was used.  Chest Pain   This is a new problem. The current episode started 12 to 24 hours ago. The problem occurs constantly. The problem has not changed since onset.The pain is associated with rest. The pain is present in the substernal region. The pain is at a severity of 4/10. The pain is moderate. The quality of the pain is described as dull. The pain does not radiate. Pertinent negatives include no abdominal pain, no back pain, no cough and no headaches.  Pertinent negatives for past medical history include no seizures.    Past Medical History:  Diagnosis Date  . Asthma    no inhaler  . Chronic abdominal pain   . Depression   . Diabetes mellitus    on insulin during this pregnancy  . GERD (gastroesophageal reflux disease)   . Hyperlipidemia   . Hypertension   . Migraine headache     There are no active problems to display for this patient.   Past Surgical History:  Procedure Laterality Date  . CESAREAN SECTION WITH BILATERAL TUBAL LIGATION  07/18/2012   Procedure: CESAREAN SECTION WITH BILATERAL TUBAL LIGATION;  Surgeon: Mickel Baas, MD;  Location: [facility];  Service: Obstetrics;  Laterality: Bilateral;  . CHOLECYSTECTOMY    . WISDOM TOOTH EXTRACTION      OB History    Gravida Para Term Preterm AB Living   0 1 2   SAB TAB Ectopic Multiple Live Births   1 0 0 0 2       Home Medications    Prior to Admission medications   Medication Sig Start Date End Date Taking? Authorizing Provider  acetaminophen (TYLENOL) 500 MG tablet Take 500 mg by mouth every 6 (six) hours  as needed for mild pain.    Historical Provider, MD  buPROPion (WELLBUTRIN SR) 150 MG 12 hr tablet Take 150 mg by mouth 2 (two) times daily.    Historical Provider, MD  dicyclomine (BENTYL) 20 MG tablet Take 1 tablet (20 mg total) by mouth 2 (two) times daily as needed for spasms. 11/02/16   Loren Racer, MD  EPINEPHrine 0.3 mg/0.3 mL IJ SOAJ injection Inject 0.3 mg into the muscle once as needed (allergic reaction).  04/09/13   Historical Provider, MD  FLUoxetine (PROZAC) 20 MG capsule Take 20 mg by mouth daily. 11/27/14 11/02/16  Historical Provider, MD  HYDROcodone-acetaminophen (NORCO/VICODIN) 5-325 MG tablet Take 1 tablet by mouth every 6 (six) hours as needed for moderate pain. 12/25/16   Bethann Berkshire, MD  ibuprofen (ADVIL,MOTRIN) 200 MG tablet Take 800 mg by mouth every 8 (eight) hours as needed. pain    Historical Provider, MD  lisinopril (PRINIVIL,ZESTRIL) 5 MG tablet Take 5 mg by mouth daily.    Historical Provider, MD  metFORMIN (GLUCOPHAGE-XR) 500 MG 24 hr tablet Take 500 mg by mouth 2 (two) times daily.    Historical Provider, MD  omeprazole (PRILOSEC) 40 MG capsule Take 40 mg by mouth 2 (two) times daily.    Historical Provider, MD  ondansetron (  ZOFRAN) 4 MG tablet Take 1 tablet (4 mg total) by mouth every 6 (six) hours as needed for nausea or vomiting. 11/02/16   Loren Racer, MD  simvastatin (ZOCOR) 20 MG tablet Take 20 mg by mouth daily.    Historical Provider, MD  topiramate (TOPAMAX) 200 MG tablet Take 200 mg by mouth 2 (two) times daily.    Historical Provider, MD    Family History Family History  Problem Relation Age of Onset  . Heart disease Mother   . COPD Mother   . Hypertension Mother   . Hyperlipidemia Mother   . Hearing loss Maternal Grandmother     Social History Social History  Substance Use Topics  . Smoking status: Current Every Day Smoker    Packs/day: 0.50    Types: Cigarettes  . Smokeless tobacco: Never Used  . Alcohol use No     Allergies     Imitrex [sumatriptan base]; Levofloxacin; Shellfish allergy; Sulfa antibiotics; Tequin; and Betadine [povidone iodine]   Review of Systems Review of Systems  Constitutional: Negative for appetite change and fatigue.  HENT: Negative for congestion, ear discharge and sinus pressure.   Eyes: Negative for discharge.  Respiratory: Negative for cough.   Cardiovascular: Positive for chest pain.  Gastrointestinal: Negative for abdominal pain and diarrhea.  Genitourinary: Negative for frequency and hematuria.  Musculoskeletal: Negative for back pain.  Skin: Negative for rash.  Neurological: Negative for seizures and headaches.  Psychiatric/Behavioral: Negative for hallucinations.     Physical Exam Updated Vital Signs BP 135/84   Pulse 80   Temp 97.8 F (36.6 C) (Oral)   Resp 18   Ht  (1.753 m)   Wt 224 lb (101.6 kg)   LMP 12/18/2016   SpO2 98%   BMI 33.08 kg/m   Physical Exam  Constitutional: She is oriented to person, place, and time. She appears well-developed.  HENT:  Head: Normocephalic.  Eyes: Conjunctivae and EOM are normal. No scleral icterus.  Neck: Neck supple. No thyromegaly present.  Cardiovascular: Normal rate and regular rhythm.  Exam reveals no gallop and no friction rub.   No murmur heard. Pulmonary/Chest: No stridor. She has no wheezes. She has no rales. She exhibits no tenderness.  Abdominal: She exhibits no distension. There is no tenderness. There is no rebound.  Musculoskeletal: Normal range of motion. She exhibits no edema.  Lymphadenopathy:    She has no cervical adenopathy.  Neurological: She is oriented to person, place, and time. She exhibits normal muscle tone. Coordination normal.  Skin: No rash noted. No erythema.  Psychiatric: She has a normal mood and affect. Her behavior is normal.     ED Treatments / Results  Labs (all labs ordered are listed, but only abnormal results are displayed) Labs Reviewed  CBC - Abnormal; Notable for the  following:       Result Value   WBC 13.8 (*)    Hemoglobin 11.6 (*)    RDW 15.6 (*)    All other components within normal limits  COMPREHENSIVE METABOLIC PANEL - Abnormal; Notable for the following:    Glucose, Bld 210 (*)    Total Bilirubin 0.2 (*)    All other components within normal limits  TROPONIN I  D-DIMER, QUANTITATIVE (NOT AT [facility])    EKG  EKG Interpretation  Date/Time:  Saturday December 25 2016 19:00:31 EDT Ventricular Rate:  80 PR Interval:    QRS Duration: 83 QT Interval:  357 QTC Calculation: 412 R Axis:   55  Text Interpretation:  Sinus rhythm Confirmed by Caddie Randle  MD, Jomarie Longs [phone]) on 12/25/2016 9:20:54 PM       Radiology Dg Chest 2 View  Result Date: 12/25/2016 CLINICAL DATA:  Acute onset of generalized chest pain, radiating to the left shoulder and upper back. Nausea and vomiting. Initial encounter. EXAM: CHEST  2 VIEW COMPARISON:  Chest radiograph performed 12/26/2014 FINDINGS: The lungs are well-aerated and clear. There is no evidence of focal opacification, pleural effusion or pneumothorax. The heart is normal in size; the mediastinal contour is within normal limits. No acute osseous abnormalities are seen. IMPRESSION: No acute cardiopulmonary process seen. Electronically Signed   By: Roanna Raider M.D.   On: 12/25/2016 19:29    Procedures Procedures (including critical care time)  Medications Ordered in ED Medications  oxyCODONE-acetaminophen (PERCOCET/ROXICET) 5-325 MG per tablet 1 tablet (not administered)  gi cocktail (Maalox,Lidocaine,Donnatal) (30 mLs Oral Given 12/25/16 2056)  pantoprazole (PROTONIX) injection 40 mg (40 mg Intravenous Given 12/25/16 2055)  morphine 2 MG/ML injection 2 mg (2 mg Intravenous Given 12/25/16 2056)  ondansetron (ZOFRAN) injection 4 mg (4 mg Intravenous Given 12/25/16 2055)     Initial Impression / Assessment and Plan / ED Course  I have reviewed the triage vital signs and the nursing notes.  Pertinent labs & imaging results  that were available during my care of the patient were reviewed by me and considered in my medical decision making (see chart for details).     Patient with chest discomfort most likely related to the prednisone causing her GERD to be worse. She improved with treatment in emergency department. EKG troponin and d-dimer all negative. Patient will be put on Vicodin and will follow-up with her PCP  Final Clinical Impressions(s) / ED Diagnoses   Final diagnoses:  Atypical chest pain    New Prescriptions New Prescriptions   HYDROCODONE-ACETAMINOPHEN (NORCO/VICODIN) 5-325 MG TABLET    Take 1 tablet by mouth every 6 (six) hours as needed for moderate pain.     Bethann Berkshire, MD 12/25/16 2125

## 2017-03-31 DIAGNOSIS — Y999 Unspecified external cause status: Secondary | ICD-10-CM | POA: Diagnosis not present

## 2017-03-31 DIAGNOSIS — I10 Essential (primary) hypertension: Secondary | ICD-10-CM | POA: Insufficient documentation

## 2017-03-31 DIAGNOSIS — Y929 Unspecified place or not applicable: Secondary | ICD-10-CM | POA: Diagnosis not present

## 2017-03-31 DIAGNOSIS — J45909 Unspecified asthma, uncomplicated: Secondary | ICD-10-CM | POA: Diagnosis not present

## 2017-03-31 DIAGNOSIS — Z79899 Other long term (current) drug therapy: Secondary | ICD-10-CM | POA: Insufficient documentation

## 2017-03-31 DIAGNOSIS — Y939 Activity, unspecified: Secondary | ICD-10-CM | POA: Insufficient documentation

## 2017-03-31 DIAGNOSIS — E119 Type 2 diabetes mellitus without complications: Secondary | ICD-10-CM | POA: Diagnosis not present

## 2017-03-31 DIAGNOSIS — Y33XXXA Other specified events, undetermined intent, initial encounter: Secondary | ICD-10-CM | POA: Insufficient documentation

## 2017-03-31 DIAGNOSIS — S025XXB Fracture of tooth (traumatic), initial encounter for open fracture: Secondary | ICD-10-CM | POA: Diagnosis not present

## 2017-03-31 DIAGNOSIS — K0889 Other specified disorders of teeth and supporting structures: Secondary | ICD-10-CM | POA: Diagnosis present

## 2017-03-31 DIAGNOSIS — F17210 Nicotine dependence, cigarettes, uncomplicated: Secondary | ICD-10-CM | POA: Insufficient documentation

## 2017-04-01 ENCOUNTER — Emergency Department (HOSPITAL_COMMUNITY)
Admission: EM | Admit: 2017-04-01 | Discharge: 2017-04-01 | Disposition: A | Discharge status: Home / Self Care | Payer: BLUE CROSS/BLUE SHIELD | Attending: Emergency Medicine | Admitting: Emergency Medicine

## 2017-04-01 ENCOUNTER — Encounter (HOSPITAL_COMMUNITY): Payer: Self-pay

## 2017-04-01 DIAGNOSIS — S025XXB Fracture of tooth (traumatic), initial encounter for open fracture: Secondary | ICD-10-CM

## 2017-04-01 DIAGNOSIS — K0889 Other specified disorders of teeth and supporting structures: Secondary | ICD-10-CM

## 2017-04-01 MED ORDER — ACETAMINOPHEN 500 MG PO TABS
1000.0000 mg | ORAL_TABLET | Freq: Once | ORAL | Status: AC
  Administered 2017-04-01: 1000 mg via ORAL
  Filled 2017-04-01: qty 2

## 2017-04-01 MED ORDER — PENICILLIN V POTASSIUM 500 MG PO TABS: 500.0000 mg | ORAL_TABLET | Freq: Four times a day (QID) | ORAL | 0 refills | Status: AC

## 2017-04-01 MED ORDER — PENICILLIN V POTASSIUM 250 MG PO TABS
500.0000 mg | ORAL_TABLET | Freq: Once | ORAL | Status: AC
  Administered 2017-04-01: 500 mg via ORAL
  Filled 2017-04-01: qty 2

## 2017-04-01 NOTE — ED Notes (Signed)
Pt states understanding of care given and follow up instructions.  Pt alert and oriented.  Ambulated from ED with steady gait 

## 2017-04-01 NOTE — Discharge Instructions (Signed)
You will need to see a dentist to get definitive care of that tooth. You can take ibuprofen 600 mg + acetaminophen 1000 mg 4 times a day for pain. DO NOT TAKE MORE THAN THAT!!! You can try anbusol on the tooth for pain. You will need to see a dentist to get your tooth pulled. If you can't see your dentist, try the denture services offices in [place].

## 2017-04-01 NOTE — ED Triage Notes (Signed)
Pain to right lower tooth that broke off 2 weeks ago.

## 2017-04-01 NOTE — ED Provider Notes (Signed)
[facility] Provider Note   CSN: [id number] Arrival date & time: 03/31/17  2338  Time seen 03:55 AM   History   Chief Complaint Chief Complaint  Patient presents with  . Dental Pain    HPI [name] is a 41 y.o. female.  HPI patient reports in April she had all of her upper teeth pulled and got dentures. Her dentist is Dr. Tyler Deis. She states she cracked one of her right lower teeth a few weeks ago however she started having pain tonight that she could not tolerate. She states she's taken about 12 over-the-counter ibuprofen tonight without relief of her pain. She states her dentist is not in the office today, Friday.  PCP Eartha Inch, MD   Past Medical History:  Diagnosis Date  . Asthma    no inhaler  . Chronic abdominal pain   . Depression   . Diabetes mellitus    on insulin during this pregnancy  . GERD (gastroesophageal reflux disease)   . Hyperlipidemia   . Hypertension   . Migraine headache     There are no active problems to display for this patient.   Past Surgical History:  Procedure Laterality Date  . CESAREAN SECTION WITH BILATERAL TUBAL LIGATION  07/18/2012   Procedure: CESAREAN SECTION WITH BILATERAL TUBAL LIGATION;  Surgeon: Mickel Baas, MD;  Location: [facility];  Service: Obstetrics;  Laterality: Bilateral;  . CHOLECYSTECTOMY    . WISDOM TOOTH EXTRACTION      OB History    Gravida Para Term Preterm AB Living   3 2 2  0 1 2   SAB TAB Ectopic Multiple Live Births   1 0 0 0 2       Home Medications    Prior to Admission medications   Medication Sig Start Date End Date Taking? Authorizing Provider  buPROPion (WELLBUTRIN SR) 150 MG 12 hr tablet Take 150 mg by mouth 2 (two) times daily.   Yes [provider]  EPINEPHrine 0.3 mg/0.3 mL IJ SOAJ injection Inject 0.3 mg into the muscle once as needed (allergic reaction).  04/09/13  Yes [provider]  FLUoxetine (PROZAC) 20 MG capsule Take 20 mg by mouth daily.  11/27/14 04/01/17 Yes [provider]  ibuprofen (ADVIL,MOTRIN) 200 MG tablet Take 800 mg by mouth every 8 (eight) hours as needed. pain   Yes [provider]  lisinopril (PRINIVIL,ZESTRIL) 5 MG tablet Take 5 mg by mouth daily.   Yes [provider]  metFORMIN (GLUCOPHAGE-XR) 500 MG 24 hr tablet Take 500 mg by mouth 2 (two) times daily.   Yes [provider]  omeprazole (PRILOSEC) 40 MG capsule Take 40 mg by mouth 2 (two) times daily.   Yes [provider]  simvastatin (ZOCOR) 20 MG tablet Take 20 mg by mouth daily.   Yes [provider]  topiramate (TOPAMAX) 200 MG tablet Take 200 mg by mouth 2 (two) times daily.   Yes [provider]  acetaminophen (TYLENOL) 500 MG tablet Take 500 mg by mouth every 6 (six) hours as needed for mild pain.    [provider]  dicyclomine (BENTYL) 20 MG tablet Take 1 tablet (20 mg total) by mouth 2 (two) times daily as needed for spasms. 11/02/16   Loren Racer, MD  HYDROcodone-acetaminophen (NORCO/VICODIN) 5-325 MG tablet Take 1 tablet by mouth every 6 (six) hours as needed for moderate pain. 12/25/16   Bethann Berkshire, MD  ondansetron (ZOFRAN) 4 MG tablet Take 1 tablet (  4 mg total) by mouth every 6 (six) hours as needed for nausea or vomiting. 11/02/16   Loren RacerYelverton, David, MD  penicillin v potassium (VEETID) 500 MG tablet Take 1 tablet (500 mg total) by mouth 4 (four) times daily. 04/01/17 04/08/17  Devoria AlbeKnapp, Pasha Gadison, MD    Family History Family History  Problem Relation Age of Onset  . Heart disease Mother   . COPD Mother   . Hypertension Mother   . Hyperlipidemia Mother   . Hearing loss Maternal Grandmother     Social History Social History  Substance Use Topics  . Smoking status: Current Every Day Smoker    Packs/day: 0.50    Types: Cigarettes  . Smokeless tobacco: Never Used  . Alcohol use No  employed   Allergies   Imitrex [sumatriptan base]; Levofloxacin; Shellfish allergy; Sulfa  antibiotics; Tequin; and Betadine [povidone iodine]   Review of Systems Review of Systems  All other systems reviewed and are negative.    Physical Exam Updated Vital Signs BP (!) 142/92 (BP Location: Right Arm)   Pulse 82   Temp 98.1 F (36.7 C) (Oral)   Resp 14   Ht 5\' 9"  (1.753 m)   Wt 100.2 kg (221 lb)   LMP 03/27/2017   SpO2 100%   BMI 32.64 kg/m   Vital signs normal    Physical Exam  Constitutional: She is oriented to person, place, and time. She appears well-developed and well-nourished.  HENT:  Head: Normocephalic and atraumatic.  Right Ear: External ear normal.  Left Ear: External ear normal.  Nose: Nose normal.  Mouth/Throat: Oropharynx is clear and moist.  Patient has upper dentures, she does not have any lower molars, her left canine tooth on the bottom is rotted to the gumline, however this is not the tooth is hurting. Her right lateral incisor is split longitudinally in the front and is gone. This is a tooth that she states is hurting. There is no facial swelling.  Eyes: Conjunctivae and EOM are normal.  Neck: Normal range of motion.  Cardiovascular: Normal rate.   Pulmonary/Chest: Effort normal. No respiratory distress.  Musculoskeletal: Normal range of motion.  Neurological: She is alert and oriented to person, place, and time. No cranial nerve deficit.  Skin: Skin is warm and dry. No erythema.  Psychiatric: She has a normal mood and affect. Her behavior is normal. Thought content normal.     ED Treatments / Results  Labs (all labs ordered are listed, but only abnormal results are displayed) Labs Reviewed - No data to display  EKG  EKG Interpretation None       Radiology No results found.  Procedures Procedures (including critical care time)  Medications Ordered in ED Medications  acetaminophen (TYLENOL) tablet 1,000 mg (1,000 mg Oral Given 04/01/17 0430)  penicillin v potassium (VEETID) tablet 500 mg (500 mg Oral Given 04/01/17 0430)       Initial Impression / Assessment and Plan / ED Course  I have reviewed the triage vital signs and the nursing notes.  Pertinent labs & imaging results that were available during my care of the patient were reviewed by me and considered in my medical decision making (see chart for details).  Patient took her own ibuprofen in the ED, she was given acetaminophen and started on Pen-Vee K. We discussed she needs to see a dentist for definitive care of the tooth, at this point it does not appear to be salvageable.  Final Clinical Impressions(s) / ED Diagnoses  Final diagnoses:  Toothache  Open fracture of tooth, initial encounter    New Prescriptions New Prescriptions   PENICILLIN V POTASSIUM (VEETID) 500 MG TABLET    Take 1 tablet (500 mg total) by mouth 4 (four) times daily.    Plan discharge  Devoria Albe, MD, Concha Pyo, MD 04/01/17 [phone]

## 2017-08-20 ENCOUNTER — Emergency Department (HOSPITAL_COMMUNITY)
Admission: EM | Admit: 2017-08-20 | Discharge: 2017-08-20 | Disposition: A | Discharge status: Home / Self Care | Payer: BLUE CROSS/BLUE SHIELD | Attending: Emergency Medicine | Admitting: Emergency Medicine

## 2017-08-20 ENCOUNTER — Encounter (HOSPITAL_COMMUNITY): Payer: Self-pay | Admitting: *Deleted

## 2017-08-20 ENCOUNTER — Other Ambulatory Visit: Payer: Self-pay

## 2017-08-20 ENCOUNTER — Emergency Department (HOSPITAL_COMMUNITY): Payer: BLUE CROSS/BLUE SHIELD

## 2017-08-20 DIAGNOSIS — F17210 Nicotine dependence, cigarettes, uncomplicated: Secondary | ICD-10-CM | POA: Diagnosis not present

## 2017-08-20 DIAGNOSIS — R112 Nausea with vomiting, unspecified: Secondary | ICD-10-CM

## 2017-08-20 DIAGNOSIS — I10 Essential (primary) hypertension: Secondary | ICD-10-CM | POA: Diagnosis not present

## 2017-08-20 DIAGNOSIS — R1031 Right lower quadrant pain: Secondary | ICD-10-CM | POA: Diagnosis present

## 2017-08-20 DIAGNOSIS — J45909 Unspecified asthma, uncomplicated: Secondary | ICD-10-CM | POA: Insufficient documentation

## 2017-08-20 DIAGNOSIS — R197 Diarrhea, unspecified: Secondary | ICD-10-CM | POA: Diagnosis not present

## 2017-08-20 DIAGNOSIS — E119 Type 2 diabetes mellitus without complications: Secondary | ICD-10-CM | POA: Insufficient documentation

## 2017-08-20 DIAGNOSIS — Z7984 Long term (current) use of oral hypoglycemic drugs: Secondary | ICD-10-CM | POA: Diagnosis not present

## 2017-08-20 LAB — CBC WITH DIFFERENTIAL/PLATELET
BASOS ABS: 0 10*3/uL (ref 0.0–0.1)
Basophils Relative: 0 %
EOS PCT: 4 %
Eosinophils Absolute: 0.3 10*3/uL (ref 0.0–0.7)
HCT: 36.2 % (ref 36.0–46.0)
Hemoglobin: 11.1 g/dL — ABNORMAL LOW (ref 12.0–15.0)
LYMPHS PCT: 25 %
Lymphs Abs: 2 10*3/uL (ref 0.7–4.0)
MCH: 26.6 pg (ref 26.0–34.0)
MCHC: 30.7 g/dL (ref 30.0–36.0)
MCV: 86.6 fL (ref 78.0–100.0)
Monocytes Absolute: 0.4 10*3/uL (ref 0.1–1.0)
Monocytes Relative: 5 %
NEUTROS PCT: 66 %
Neutro Abs: 5.2 10*3/uL (ref 1.7–7.7)
PLATELETS: 281 10*3/uL (ref 150–400)
RBC: 4.18 MIL/uL (ref 3.87–5.11)
RDW: 15.3 % (ref 11.5–15.5)
WBC: 7.9 10*3/uL (ref 4.0–10.5)

## 2017-08-20 LAB — LIPASE, BLOOD: Lipase: 30 U/L (ref 11–51)

## 2017-08-20 LAB — COMPREHENSIVE METABOLIC PANEL
ALT: 22 U/L (ref 14–54)
AST: 16 U/L (ref 15–41)
Albumin: 4 g/dL (ref 3.5–5.0)
Alkaline Phosphatase: 64 U/L (ref 38–126)
Anion gap: 4 — ABNORMAL LOW (ref 5–15)
BUN: 19 mg/dL (ref 6–20)
CHLORIDE: 111 mmol/L (ref 101–111)
CO2: 21 mmol/L — AB (ref 22–32)
CREATININE: 0.74 mg/dL (ref 0.44–1.00)
Calcium: 9.2 mg/dL (ref 8.9–10.3)
GFR calc Af Amer: >60 mL/min (ref >60)
Glucose, Bld: 135 mg/dL — ABNORMAL HIGH (ref 65–99)
Potassium: 4 mmol/L (ref 3.5–5.1)
SODIUM: 136 mmol/L (ref 135–145)
Total Bilirubin: 0.6 mg/dL (ref 0.3–1.2)
Total Protein: 7.3 g/dL (ref 6.5–8.1)

## 2017-08-20 LAB — PREGNANCY, URINE: Preg Test, Ur: NEGATIVE

## 2017-08-20 LAB — CBG MONITORING, ED: GLUCOSE-CAPILLARY: 148 mg/dL — AB (ref 65–99)

## 2017-08-20 MED ORDER — SODIUM CHLORIDE 0.9 % IV BOLUS (SEPSIS)
1000.0000 mL | Freq: Once | INTRAVENOUS | Status: AC
  Administered 2017-08-20: 1000 mL via INTRAVENOUS

## 2017-08-20 MED ORDER — IOPAMIDOL (ISOVUE-300) INJECTION 61%
100.0000 mL | Freq: Once | INTRAVENOUS | Status: AC | PRN
  Administered 2017-08-20: 100 mL via INTRAVENOUS

## 2017-08-20 MED ORDER — METOCLOPRAMIDE HCL 5 MG/ML IJ SOLN
INTRAMUSCULAR | Status: AC
  Filled 2017-08-20: qty 2

## 2017-08-20 MED ORDER — ONDANSETRON HCL 4 MG/5ML PO SOLN
4.0000 mg | Freq: Once | ORAL | Status: AC
  Administered 2017-08-20: 4 mg via ORAL
  Filled 2017-08-20: qty 1

## 2017-08-20 MED ORDER — PROMETHAZINE HCL 25 MG/ML IJ SOLN
12.5000 mg | Freq: Once | INTRAMUSCULAR | Status: AC
  Administered 2017-08-20: 12.5 mg via INTRAVENOUS
  Filled 2017-08-20: qty 1

## 2017-08-20 MED ORDER — METOCLOPRAMIDE HCL 5 MG/ML IJ SOLN
10.0000 mg | Freq: Once | INTRAMUSCULAR | Status: AC
  Administered 2017-08-20: 10 mg via INTRAVENOUS

## 2017-08-20 MED ORDER — FENTANYL CITRATE (PF) 100 MCG/2ML IJ SOLN
50.0000 ug | Freq: Once | INTRAMUSCULAR | Status: AC
  Administered 2017-08-20: 50 ug via INTRAVENOUS
  Filled 2017-08-20: qty 2

## 2017-08-20 MED ORDER — ONDANSETRON 4 MG PO TBDP: 4.0000 mg | ORAL_TABLET | Freq: Three times a day (TID) | ORAL | 0 refills | Status: AC | PRN

## 2017-08-20 NOTE — ED Provider Notes (Signed)
9:36 AM Assumed care from Dr. Manus Gunningancour, please see their note for full history, physical and decision making until this point. In brief this is a 41 y.o. year old female who presented to the ED tonight with Abdominal Pain     41 year old female who is here with vomiting and abdominal pain.  CT scan negative was pending ultrasound for torsion as she did have a right ovarian cyst.  Her ultrasound was negative.  She did require 1 more dose of nausea medicine has been tolerating p.o.  Her abdomen is benign at this time.  Patient stable for discharge on antiemetics.  Suspect likely gastroenteritis or possibly some other early process that we have not yet identified so I gave strict return precautions for new or worsening symptoms, given. Patient verbalized understanding and agreement with the plan as described.   Labs, studies and imaging reviewed by myself and considered in medical decision making if ordered. Imaging interpreted by radiology.  Labs Reviewed  CBC WITH DIFFERENTIAL/PLATELET - Abnormal; Notable for the following components:      Result Value   Hemoglobin 11.1 (*)    All other components within normal limits  COMPREHENSIVE METABOLIC PANEL - Abnormal; Notable for the following components:   CO2 21 (*)    Glucose, Bld 135 (*)    Anion gap 4 (*)    All other components within normal limits  CBG MONITORING, ED - Abnormal; Notable for the following components:   Glucose-Capillary 148 (*)    All other components within normal limits  LIPASE, BLOOD  PREGNANCY, URINE    US Pelvis Complete  Final Result    US Transvaginal Non-OB  Final Result    US Art/Ven Flow Abd Pelv Doppler  Final Result    CT Abdomen Pelvis W Contrast  Final Result      No Follow-up on file.    Marily MemosMesner, Llesenia Fogal, MD 08/20/17 [phone]

## 2017-08-20 NOTE — ED Triage Notes (Signed)
Pt states that she started having n/v/d, abd pain 40 minutes after eating tonight,

## 2017-08-20 NOTE — ED Provider Notes (Signed)
[facility] EMERGENCY DEPARTMENT Provider Note   CSN: [id number] Arrival date & time: 08/20/17  0205     History   Chief Complaint Chief Complaint  Patient presents with  . Abdominal Pain    HPI Catalina Lungeronya L Resurreccion is a 41 y.o. female.  Patient presents with vomiting, diarrhea and abdominal pain onset tonight about 40 minutes after eating some chicken chili that she made.  She felt fine prior to eating the chili.  She believes that she got food poisoning.  No one else ate the chili.  She has not done any traveling.  She had multiple episodes of nonbloody nonbilious emesis about 8 or 9 times along with diarrhea about 7 or 8 times.  She has developed right-sided lower abdominal pain that is worse with palpation and movement.  Denies fevers.  Denies pain with urination or blood in the urine.  Denies any vaginal bleeding or discharge.  Previous cholecystectomy.   The history is provided by the patient.  Abdominal Pain   Associated symptoms include diarrhea, nausea and vomiting. Pertinent negatives include fever, dysuria, hematuria, headaches, arthralgias and myalgias.    Past Medical History:  Diagnosis Date  . Asthma    no inhaler  . Chronic abdominal pain   . Depression   . Diabetes mellitus    on insulin during this pregnancy  . GERD (gastroesophageal reflux disease)   . Hyperlipidemia   . Hypertension   . Migraine headache     There are no active problems to display for this patient.   Past Surgical History:  Procedure Laterality Date  . CESAREAN SECTION WITH BILATERAL TUBAL LIGATION  07/18/2012   Procedure: CESAREAN SECTION WITH BILATERAL TUBAL LIGATION;  Surgeon: Mickel Baasichard D Kaplan, MD;  Location: [facility];  Service: Obstetrics;  Laterality: Bilateral;  . CHOLECYSTECTOMY    . WISDOM TOOTH EXTRACTION      OB History    Gravida Para Term Preterm AB Living   3 2 2  0 1 2   SAB TAB Ectopic Multiple Live Births   1 0 0 0 2       Home Medications    Prior to  Admission medications   Medication Sig Start Date End Date Taking? Authorizing Provider  acetaminophen (TYLENOL) 500 MG tablet Take 500 mg by mouth every 6 (six) hours as needed for mild pain.    [provider]  buPROPion (WELLBUTRIN SR) 150 MG 12 hr tablet Take 150 mg by mouth 2 (two) times daily.    [provider]  dicyclomine (BENTYL) 20 MG tablet Take 1 tablet (20 mg total) by mouth 2 (two) times daily as needed for spasms. 11/02/16   Loren RacerYelverton, David, MD  EPINEPHrine 0.3 mg/0.3 mL IJ SOAJ injection Inject 0.3 mg into the muscle once as needed (allergic reaction).  04/09/13   [provider]  FLUoxetine (PROZAC) 20 MG capsule Take 20 mg by mouth daily. 11/27/14 04/01/17  [provider]  HYDROcodone-acetaminophen (NORCO/VICODIN) 5-325 MG tablet Take 1 tablet by mouth every 6 (six) hours as needed for moderate pain. 12/25/16   Bethann BerkshireZammit, Joseph, MD  ibuprofen (ADVIL,MOTRIN) 200 MG tablet Take 800 mg by mouth every 8 (eight) hours as needed. pain    [provider]  lisinopril (PRINIVIL,ZESTRIL) 5 MG tablet Take 5 mg by mouth daily.    [provider]  metFORMIN (GLUCOPHAGE-XR) 500 MG 24 hr tablet Take 500 mg by mouth 2 (two) times daily.    [provider]  omeprazole (PRILOSEC)  40 MG capsule Take 40 mg by mouth 2 (two) times daily.    [provider]  ondansetron (ZOFRAN) 4 MG tablet Take 1 tablet (4 mg total) by mouth every 6 (six) hours as needed for nausea or vomiting. 11/02/16   Loren Racer, MD  simvastatin (ZOCOR) 20 MG tablet Take 20 mg by mouth daily.    [provider]  topiramate (TOPAMAX) 200 MG tablet Take 200 mg by mouth 2 (two) times daily.    [provider]    Family History Family History  Problem Relation Age of Onset  . Heart disease Mother   . COPD Mother   . Hypertension Mother   . Hyperlipidemia Mother   . Hearing loss Maternal Grandmother     Social History Social History    Tobacco Use  . Smoking status: Current Every Day Smoker    Packs/day: 0.50    Types: Cigarettes  . Smokeless tobacco: Never Used  Substance Use Topics  . Alcohol use: No  . Drug use: No     Allergies   Imitrex [sumatriptan base]; Levofloxacin; Shellfish allergy; Sulfa antibiotics; Tequin; and Betadine [povidone iodine]   Review of Systems Review of Systems  Constitutional: Positive for activity change and appetite change. Negative for fatigue and fever.  HENT: Negative for congestion and rhinorrhea.   Eyes: Negative for visual disturbance.  Respiratory: Negative for cough, chest tightness and shortness of breath.   Cardiovascular: Negative for chest pain.  Gastrointestinal: Positive for abdominal pain, diarrhea, nausea and vomiting.  Genitourinary: Negative for dysuria, hematuria, vaginal bleeding and vaginal discharge.  Musculoskeletal: Negative for arthralgias, back pain and myalgias.  Skin: Negative for rash.  Neurological: Negative for dizziness, weakness and headaches.  , all other systems are negative except as noted in the HPI and PMH.    Physical Exam Updated Vital Signs BP (!) 147/87   Pulse 75   Temp 98.2 F (36.8 C) (Oral)   Resp 16   Ht 5\' 9"  (1.753 m)   Wt 97.6 kg (215 lb 4 oz)   LMP 08/14/2017   SpO2 99%   BMI 31.79 kg/m   Physical Exam  Constitutional: She is oriented to person, place, and time. She appears well-developed and well-nourished. No distress.  HENT:  Head: Normocephalic and atraumatic.  Mouth/Throat: Oropharynx is clear and moist. No oropharyngeal exudate.  Eyes: Conjunctivae and EOM are normal. Pupils are equal, round, and reactive to light.  Neck: Normal range of motion. Neck supple.  No meningismus.  Cardiovascular: Normal rate, regular rhythm, normal heart sounds and intact distal pulses.  No murmur heard. Pulmonary/Chest: Effort normal and breath sounds normal. No respiratory distress.  Abdominal: Soft. There is tenderness.  There is guarding. There is no rebound.  Obese TTP RLQ with guarding  Musculoskeletal: Normal range of motion. She exhibits no edema or tenderness.  No CVAT  Neurological: She is alert and oriented to person, place, and time. No cranial nerve deficit. She exhibits normal muscle tone. Coordination normal.  No ataxia on finger to nose bilaterally. No pronator drift. 5/5 strength throughout. CN 2-12 intact.Equal grip strength. Sensation intact.   Skin: Skin is warm.  Psychiatric: She has a normal mood and affect. Her behavior is normal.  Nursing note and vitals reviewed.    ED Treatments / Results  Labs (all labs ordered are listed, but only abnormal results are displayed) Labs Reviewed  CBC WITH DIFFERENTIAL/PLATELET - Abnormal; Notable for the following components:      Result  Value   Hemoglobin 11.1 (*)    All other components within normal limits  COMPREHENSIVE METABOLIC PANEL - Abnormal; Notable for the following components:   CO2 21 (*)    Glucose, Bld 135 (*)    Anion gap 4 (*)    All other components within normal limits  CBG MONITORING, ED - Abnormal; Notable for the following components:   Glucose-Capillary 148 (*)    All other components within normal limits  LIPASE, BLOOD  PREGNANCY, URINE    EKG  EKG Interpretation None       Radiology Ct Abdomen Pelvis W Contrast  Result Date: 08/20/2017 CLINICAL DATA:  Abdominal pain.  Nausea and vomiting. EXAM: CT ABDOMEN AND PELVIS WITH CONTRAST TECHNIQUE: Multidetector CT imaging of the abdomen and pelvis was performed using the standard protocol following bolus administration of intravenous contrast. CONTRAST:  100mL ISOVUE-300 IOPAMIDOL (ISOVUE-300) INJECTION 61% COMPARISON:  CT 11/02/2016 FINDINGS: Lower chest: The lung bases are clear. Hepatobiliary: No focal liver abnormality is seen. Gallbladder is surgically absent. No biliary dilatation. Pancreas: No ductal dilatation or inflammation. Spleen: Normal in size without  focal abnormality. Adrenals/Urinary Tract: Normal adrenal glands. No hydronephrosis or perinephric edema. Homogeneous renal enhancement with symmetric excretion on delayed phase imaging. Urinary bladder is nondistended. Stomach/Bowel: Stomach physiologically distended. No bowel inflammation, wall thickening or obstruction. Normal appendix best appreciated on coronal image 60. Moderate colonic stool burden, no colonic wall thickening. Vascular/Lymphatic: No enlarged abdominal or pelvic lymph nodes. No acute vascular findings are present. Normal caliber abdominal aorta. Reproductive: 2.8 cm simple cyst in the right ovary. Left ovary is unremarkable. Tubulation clips noted. Other: Small amount pelvic free fluid, likely physiologic. No free air. No upper abdominal ascites. No intra-abdominal abscess. Musculoskeletal: There are no acute or suspicious osseous abnormalities. IMPRESSION: No acute abnormality or explanation for abdominal pain. Electronically Signed   By: Rubye OaksMelanie  Ehinger M.D.   On: 08/20/2017 05:09    Procedures Procedures (including critical care time)  Medications Ordered in ED Medications  sodium chloride 0.9 % bolus 1,000 mL (1,000 mLs Intravenous New Bag/Given 08/20/17 0333)  ondansetron (ZOFRAN) 4 MG/5ML solution 4 mg (4 mg Oral Given 08/20/17 0333)     Initial Impression / Assessment and Plan / ED Course  I have reviewed the triage vital signs and the nursing notes.  Pertinent labs & imaging results that were available during my care of the patient were reviewed by me and considered in my medical decision making (see chart for details).    Abdominal pain with nausea vomiting and diarrhea after eating chicken chili.  History is concerning for food poisoning though patient has significant right lower quadrant tenderness on exam.  Patient given IV fluids and symptom control.  Labs are reassuring.  No leukocytosis.  With her significant right lower quadrant tenderness, CT scan is  obtained to evaluate appendix.  This is normal.  Patient with no further vomiting in the ED.  CT scan shows a right-sided ovarian cyst which may be causing her abdominal pain.  Patient continues to be uncomfortable and feels that her pain is not controlled.  Plan to obtain ultrasound to further evaluate this ovarian cyst to rule out torsion.  Care transferred to Dr. Clayborne DanaMesner at shift change.  Final Clinical Impressions(s) / ED Diagnoses   Final diagnoses:  None    ED Discharge Orders    None       Sunil Hue, Jeannett SeniorStephen, MD 08/20/17 [id number]

## 2017-08-20 NOTE — ED Notes (Signed)
Pt has been transported to imaging with tech.

## 2017-08-20 NOTE — ED Notes (Signed)
Patient back from imaging

## 2017-08-20 NOTE — ED Notes (Signed)
Patient transported to CT 

## 2017-08-20 NOTE — ED Notes (Signed)
Pt PASSED fluid challenge

## 2018-05-17 ENCOUNTER — Other Ambulatory Visit: Payer: Self-pay

## 2018-05-17 ENCOUNTER — Encounter (HOSPITAL_COMMUNITY): Payer: Self-pay

## 2018-05-17 ENCOUNTER — Encounter (HOSPITAL_COMMUNITY)
Admission: EM | Disposition: A | Discharge status: Home / Self Care | Payer: Self-pay | Source: Home / Self Care | Attending: Emergency Medicine

## 2018-05-17 ENCOUNTER — Observation Stay (HOSPITAL_COMMUNITY)
Admission: EM | Admit: 2018-05-17 | Discharge: 2018-05-17 | Disposition: A | Discharge status: Home / Self Care | Payer: BLUE CROSS/BLUE SHIELD | Attending: Cardiology | Admitting: Cardiology

## 2018-05-17 ENCOUNTER — Emergency Department (HOSPITAL_COMMUNITY): Payer: BLUE CROSS/BLUE SHIELD

## 2018-05-17 DIAGNOSIS — K219 Gastro-esophageal reflux disease without esophagitis: Secondary | ICD-10-CM | POA: Diagnosis not present

## 2018-05-17 DIAGNOSIS — R079 Chest pain, unspecified: Secondary | ICD-10-CM | POA: Diagnosis present

## 2018-05-17 DIAGNOSIS — I10 Essential (primary) hypertension: Secondary | ICD-10-CM | POA: Diagnosis not present

## 2018-05-17 DIAGNOSIS — Z9049 Acquired absence of other specified parts of digestive tract: Secondary | ICD-10-CM | POA: Insufficient documentation

## 2018-05-17 DIAGNOSIS — Z9889 Other specified postprocedural states: Secondary | ICD-10-CM | POA: Diagnosis not present

## 2018-05-17 DIAGNOSIS — E782 Mixed hyperlipidemia: Secondary | ICD-10-CM

## 2018-05-17 DIAGNOSIS — I200 Unstable angina: Secondary | ICD-10-CM | POA: Diagnosis not present

## 2018-05-17 DIAGNOSIS — Z881 Allergy status to other antibiotic agents status: Secondary | ICD-10-CM | POA: Diagnosis not present

## 2018-05-17 DIAGNOSIS — F17210 Nicotine dependence, cigarettes, uncomplicated: Secondary | ICD-10-CM | POA: Insufficient documentation

## 2018-05-17 DIAGNOSIS — Z888 Allergy status to other drugs, medicaments and biological substances status: Secondary | ICD-10-CM | POA: Diagnosis not present

## 2018-05-17 DIAGNOSIS — F329 Major depressive disorder, single episode, unspecified: Secondary | ICD-10-CM | POA: Insufficient documentation

## 2018-05-17 DIAGNOSIS — E119 Type 2 diabetes mellitus without complications: Secondary | ICD-10-CM

## 2018-05-17 DIAGNOSIS — Z91013 Allergy to seafood: Secondary | ICD-10-CM | POA: Diagnosis not present

## 2018-05-17 DIAGNOSIS — Z8249 Family history of ischemic heart disease and other diseases of the circulatory system: Secondary | ICD-10-CM | POA: Insufficient documentation

## 2018-05-17 DIAGNOSIS — Z79899 Other long term (current) drug therapy: Secondary | ICD-10-CM | POA: Diagnosis not present

## 2018-05-17 DIAGNOSIS — Z7984 Long term (current) use of oral hypoglycemic drugs: Secondary | ICD-10-CM | POA: Insufficient documentation

## 2018-05-17 DIAGNOSIS — Z9851 Tubal ligation status: Secondary | ICD-10-CM | POA: Insufficient documentation

## 2018-05-17 DIAGNOSIS — Z720 Tobacco use: Secondary | ICD-10-CM

## 2018-05-17 DIAGNOSIS — Z882 Allergy status to sulfonamides status: Secondary | ICD-10-CM | POA: Insufficient documentation

## 2018-05-17 HISTORY — PX: LEFT HEART CATH AND CORONARY ANGIOGRAPHY: CATH118249

## 2018-05-17 HISTORY — DX: Unstable angina: I20.0

## 2018-05-17 LAB — HEMOGLOBIN A1C
Hgb A1c MFr Bld: 5.7 % — ABNORMAL HIGH (ref 4.8–5.6)
MEAN PLASMA GLUCOSE: 116.89 mg/dL

## 2018-05-17 LAB — I-STAT BETA HCG BLOOD, ED (MC, WL, AP ONLY)

## 2018-05-17 LAB — COMPREHENSIVE METABOLIC PANEL
ALBUMIN: 3.7 g/dL (ref 3.5–5.0)
ALT: 15 U/L (ref 0–44)
AST: 14 U/L — ABNORMAL LOW (ref 15–41)
Alkaline Phosphatase: 47 U/L (ref 38–126)
Anion gap: 5 (ref 5–15)
BILIRUBIN TOTAL: 0.6 mg/dL (ref 0.3–1.2)
BUN: 10 mg/dL (ref 6–20)
CO2: 26 mmol/L (ref 22–32)
Calcium: 8.9 mg/dL (ref 8.9–10.3)
Chloride: 109 mmol/L (ref 98–111)
Creatinine, Ser: 0.75 mg/dL (ref 0.44–1.00)
GFR calc Af Amer: >60 mL/min (ref >60)
GFR calc non Af Amer: >60 mL/min (ref >60)
GLUCOSE: 98 mg/dL (ref 70–99)
POTASSIUM: 3.8 mmol/L (ref 3.5–5.1)
Sodium: 140 mmol/L (ref 135–145)
TOTAL PROTEIN: 6.9 g/dL (ref 6.5–8.1)

## 2018-05-17 LAB — CBC
HCT: 33.1 % — ABNORMAL LOW (ref 36.0–46.0)
HEMATOCRIT: 32.8 % — AB (ref 36.0–46.0)
HEMOGLOBIN: 10.1 g/dL — AB (ref 12.0–15.0)
Hemoglobin: 10 g/dL — ABNORMAL LOW (ref 12.0–15.0)
MCH: 26.3 pg (ref 26.0–34.0)
MCH: 25.8 pg — AB (ref 26.0–34.0)
MCHC: 30.8 g/dL (ref 30.0–36.0)
MCHC: 30.2 g/dL (ref 30.0–36.0)
MCV: 85.4 fL (ref 78.0–100.0)
MCV: 85.3 fL (ref 78.0–100.0)
PLATELETS: 275 10*3/uL (ref 150–400)
Platelets: 272 10*3/uL (ref 150–400)
RBC: 3.84 MIL/uL — ABNORMAL LOW (ref 3.87–5.11)
RBC: 3.88 MIL/uL (ref 3.87–5.11)
RDW: 15.9 % — AB (ref 11.5–15.5)
RDW: 15.9 % — ABNORMAL HIGH (ref 11.5–15.5)
WBC: 4.9 10*3/uL (ref 4.0–10.5)
WBC: 6.4 10*3/uL (ref 4.0–10.5)

## 2018-05-17 LAB — LIPID PANEL
CHOL/HDL RATIO: 3.3 ratio
CHOLESTEROL: 162 mg/dL (ref 0–200)
HDL: 49 mg/dL (ref >40)
LDL Cholesterol: 94 mg/dL (ref 0–99)
Triglycerides: 97 mg/dL (ref <150)
VLDL: 19 mg/dL (ref 0–40)

## 2018-05-17 LAB — BASIC METABOLIC PANEL
ANION GAP: 6 (ref 5–15)
BUN: 13 mg/dL (ref 6–20)
CALCIUM: 8.8 mg/dL — AB (ref 8.9–10.3)
CO2: 23 mmol/L (ref 22–32)
Chloride: 111 mmol/L (ref 98–111)
Creatinine, Ser: 0.78 mg/dL (ref 0.44–1.00)
GFR calc Af Amer: >60 mL/min (ref >60)
GLUCOSE: 116 mg/dL — AB (ref 70–99)
POTASSIUM: 4.2 mmol/L (ref 3.5–5.1)
Sodium: 140 mmol/L (ref 135–145)

## 2018-05-17 LAB — GLUCOSE, CAPILLARY
Glucose-Capillary: 91 mg/dL (ref 70–99)
Glucose-Capillary: 82 mg/dL (ref 70–99)
Glucose-Capillary: 150 mg/dL — ABNORMAL HIGH (ref 70–99)

## 2018-05-17 LAB — I-STAT TROPONIN, ED: TROPONIN I, POC: 0.01 ng/mL (ref 0.00–0.08)

## 2018-05-17 LAB — PROTIME-INR
INR: 0.92
PROTHROMBIN TIME: 12.3 s (ref 11.4–15.2)

## 2018-05-17 LAB — APTT: APTT: 26 s (ref 24–36)

## 2018-05-17 LAB — HEPARIN LEVEL (UNFRACTIONATED): Heparin Unfractionated: 0.18 IU/mL — ABNORMAL LOW (ref 0.30–0.70)

## 2018-05-17 LAB — TROPONIN I
Troponin I: <0.03 ng/mL (ref <0.03)
Troponin I: <0.03 ng/mL
Troponin I: <0.03 ng/mL

## 2018-05-17 SURGERY — LEFT HEART CATH AND CORONARY ANGIOGRAPHY
Anesthesia: LOCAL

## 2018-05-17 MED ORDER — MIDAZOLAM HCL 2 MG/2ML IJ SOLN
INTRAMUSCULAR | Status: AC
  Filled 2018-05-17: qty 2

## 2018-05-17 MED ORDER — SODIUM CHLORIDE 0.9% FLUSH: 3.0000 mL | INTRAVENOUS | Status: DC | PRN

## 2018-05-17 MED ORDER — ONDANSETRON HCL 4 MG PO TABS: 4.0000 mg | ORAL_TABLET | Freq: Four times a day (QID) | ORAL | Status: DC | PRN

## 2018-05-17 MED ORDER — ASPIRIN 81 MG PO CHEW: 81.0000 mg | CHEWABLE_TABLET | ORAL | Status: DC

## 2018-05-17 MED ORDER — ONDANSETRON HCL 4 MG/2ML IJ SOLN
4.0000 mg | Freq: Once | INTRAMUSCULAR | Status: AC
  Administered 2018-05-17: 4 mg via INTRAVENOUS
  Filled 2018-05-17: qty 2

## 2018-05-17 MED ORDER — SODIUM CHLORIDE 0.9 % IV SOLN
INTRAVENOUS | Status: DC
  Administered 2018-05-17: 14:00:00 via INTRAVENOUS

## 2018-05-17 MED ORDER — HEPARIN (PORCINE) IN NACL 100-0.45 UNIT/ML-% IJ SOLN
1400.0000 [IU]/h | INTRAMUSCULAR | Status: DC
  Administered 2018-05-17: 1100 [IU]/h via INTRAVENOUS
  Filled 2018-05-17: qty 250

## 2018-05-17 MED ORDER — SODIUM CHLORIDE 0.9% FLUSH
3.0000 mL | Freq: Two times a day (BID) | INTRAVENOUS | Status: DC
  Administered 2018-05-17: 3 mL via INTRAVENOUS

## 2018-05-17 MED ORDER — MIDAZOLAM HCL 2 MG/2ML IJ SOLN
INTRAMUSCULAR | Status: DC | PRN
  Administered 2018-05-17: 2 mg via INTRAVENOUS

## 2018-05-17 MED ORDER — TOPIRAMATE 100 MG PO TABS
200.0000 mg | ORAL_TABLET | Freq: Two times a day (BID) | ORAL | Status: DC
  Administered 2018-05-17: 200 mg via ORAL
  Filled 2018-05-17 (×2): qty 2

## 2018-05-17 MED ORDER — SODIUM CHLORIDE 0.9 % IV SOLN: 250.0000 mL | INTRAVENOUS | Status: DC | PRN

## 2018-05-17 MED ORDER — HYDROMORPHONE HCL 1 MG/ML IJ SOLN
1.0000 mg | Freq: Once | INTRAMUSCULAR | Status: AC
  Administered 2018-05-17: 1 mg via INTRAVENOUS
  Filled 2018-05-17: qty 1

## 2018-05-17 MED ORDER — LIDOCAINE HCL (PF) 1 % IJ SOLN
INTRAMUSCULAR | Status: AC
  Filled 2018-05-17: qty 30

## 2018-05-17 MED ORDER — HEPARIN BOLUS VIA INFUSION
2500.0000 [IU] | Freq: Once | INTRAVENOUS | Status: DC
  Filled 2018-05-17: qty 2500

## 2018-05-17 MED ORDER — ENOXAPARIN SODIUM 40 MG/0.4ML ~~LOC~~ SOLN: 40.0000 mg | SUBCUTANEOUS | Status: DC

## 2018-05-17 MED ORDER — HEPARIN (PORCINE) IN NACL 1000-0.9 UT/500ML-% IV SOLN
INTRAVENOUS | Status: AC
  Filled 2018-05-17: qty 1000

## 2018-05-17 MED ORDER — ACETAMINOPHEN 500 MG PO TABS
500.0000 mg | ORAL_TABLET | Freq: Four times a day (QID) | ORAL | Status: DC | PRN
  Administered 2018-05-17: 500 mg via ORAL
  Filled 2018-05-17: qty 1

## 2018-05-17 MED ORDER — MORPHINE SULFATE (PF) 2 MG/ML IV SOLN
2.0000 mg | Freq: Once | INTRAVENOUS | Status: AC
  Administered 2018-05-17: 2 mg via INTRAVENOUS
  Filled 2018-05-17: qty 1

## 2018-05-17 MED ORDER — ACETAMINOPHEN 325 MG PO TABS
650.0000 mg | ORAL_TABLET | Freq: Once | ORAL | Status: AC
  Administered 2018-05-17: 650 mg via ORAL
  Filled 2018-05-17: qty 2

## 2018-05-17 MED ORDER — BUPROPION HCL ER (SR) 150 MG PO TB12
150.0000 mg | ORAL_TABLET | Freq: Two times a day (BID) | ORAL | Status: DC
  Administered 2018-05-17: 150 mg via ORAL
  Filled 2018-05-17 (×2): qty 1

## 2018-05-17 MED ORDER — SODIUM CHLORIDE 0.9% FLUSH: 3.0000 mL | Freq: Two times a day (BID) | INTRAVENOUS | Status: DC

## 2018-05-17 MED ORDER — ASPIRIN EC 81 MG PO TBEC: 81.0000 mg | DELAYED_RELEASE_TABLET | Freq: Every day | ORAL | Status: DC

## 2018-05-17 MED ORDER — PANTOPRAZOLE SODIUM 40 MG PO TBEC
40.0000 mg | DELAYED_RELEASE_TABLET | Freq: Every day | ORAL | Status: DC
  Administered 2018-05-17: 40 mg via ORAL
  Filled 2018-05-17: qty 1

## 2018-05-17 MED ORDER — LIDOCAINE HCL (PF) 1 % IJ SOLN
INTRAMUSCULAR | Status: DC | PRN
  Administered 2018-05-17: 2 mL

## 2018-05-17 MED ORDER — VERAPAMIL HCL 2.5 MG/ML IV SOLN
INTRAVENOUS | Status: DC | PRN
  Administered 2018-05-17: 10 mL via INTRA_ARTERIAL

## 2018-05-17 MED ORDER — ALPRAZOLAM 0.5 MG PO TABS
0.5000 mg | ORAL_TABLET | Freq: Two times a day (BID) | ORAL | Status: DC | PRN
  Administered 2018-05-17: 0.5 mg via ORAL
  Filled 2018-05-17: qty 1

## 2018-05-17 MED ORDER — FENTANYL CITRATE (PF) 100 MCG/2ML IJ SOLN
INTRAMUSCULAR | Status: AC
  Filled 2018-05-17: qty 2

## 2018-05-17 MED ORDER — HEPARIN SODIUM (PORCINE) 1000 UNIT/ML IJ SOLN
INTRAMUSCULAR | Status: DC | PRN
  Administered 2018-05-17: 5000 [IU] via INTRAVENOUS

## 2018-05-17 MED ORDER — INSULIN ASPART 100 UNIT/ML ~~LOC~~ SOLN: 0.0000 [IU] | Freq: Three times a day (TID) | SUBCUTANEOUS | Status: DC

## 2018-05-17 MED ORDER — PANTOPRAZOLE SODIUM 40 MG PO TBEC: 40.0000 mg | DELAYED_RELEASE_TABLET | Freq: Every day | ORAL | Status: DC

## 2018-05-17 MED ORDER — LISINOPRIL 5 MG PO TABS
5.0000 mg | ORAL_TABLET | Freq: Every day | ORAL | Status: DC
  Administered 2018-05-17: 5 mg via ORAL
  Filled 2018-05-17: qty 1

## 2018-05-17 MED ORDER — IOPAMIDOL (ISOVUE-370) INJECTION 76%
INTRAVENOUS | Status: AC
  Filled 2018-05-17: qty 100

## 2018-05-17 MED ORDER — NITROGLYCERIN 0.4 MG SL SUBL
0.4000 mg | SUBLINGUAL_TABLET | SUBLINGUAL | Status: DC | PRN
  Administered 2018-05-17 (×2): 0.4 mg via SUBLINGUAL
  Filled 2018-05-17: qty 1

## 2018-05-17 MED ORDER — ASPIRIN 81 MG PO CHEW
324.0000 mg | CHEWABLE_TABLET | Freq: Once | ORAL | Status: AC
  Administered 2018-05-17: 324 mg via ORAL
  Filled 2018-05-17: qty 4

## 2018-05-17 MED ORDER — HEPARIN BOLUS VIA INFUSION
4000.0000 [IU] | Freq: Once | INTRAVENOUS | Status: AC
  Administered 2018-05-17: 4000 [IU] via INTRAVENOUS

## 2018-05-17 MED ORDER — HEPARIN (PORCINE) IN NACL 1000-0.9 UT/500ML-% IV SOLN
INTRAVENOUS | Status: DC | PRN
  Administered 2018-05-17 (×2): 500 mL

## 2018-05-17 MED ORDER — FENTANYL CITRATE (PF) 100 MCG/2ML IJ SOLN
INTRAMUSCULAR | Status: DC | PRN
  Administered 2018-05-17 (×2): 25 ug via INTRAVENOUS

## 2018-05-17 MED ORDER — SIMVASTATIN 20 MG PO TABS
20.0000 mg | ORAL_TABLET | Freq: Every day | ORAL | Status: DC
  Administered 2018-05-17: 20 mg via ORAL
  Filled 2018-05-17: qty 1

## 2018-05-17 MED ORDER — SODIUM CHLORIDE 0.9 % WEIGHT BASED INFUSION
1.0000 mL/kg/h | INTRAVENOUS | Status: DC
  Administered 2018-05-17: 1 mL/kg/h via INTRAVENOUS

## 2018-05-17 MED ORDER — IOPAMIDOL (ISOVUE-370) INJECTION 76%
INTRAVENOUS | Status: DC | PRN
  Administered 2018-05-17: 60 mL via INTRAVENOUS

## 2018-05-17 MED ORDER — ONDANSETRON HCL 4 MG/2ML IJ SOLN: 4.0000 mg | Freq: Four times a day (QID) | INTRAMUSCULAR | Status: DC | PRN

## 2018-05-17 MED ORDER — IOPAMIDOL (ISOVUE-370) INJECTION 76%
INTRAVENOUS | Status: AC
  Filled 2018-05-17: qty 150

## 2018-05-17 MED ORDER — HEPARIN SODIUM (PORCINE) 1000 UNIT/ML IJ SOLN
INTRAMUSCULAR | Status: AC
  Filled 2018-05-17: qty 1

## 2018-05-17 MED ORDER — MORPHINE SULFATE (PF) 2 MG/ML IV SOLN
1.0000 mg | Freq: Once | INTRAVENOUS | Status: AC
  Administered 2018-05-17: 1 mg via INTRAVENOUS
  Filled 2018-05-17: qty 1

## 2018-05-17 MED ORDER — VERAPAMIL HCL 2.5 MG/ML IV SOLN
INTRAVENOUS | Status: AC
  Filled 2018-05-17: qty 2

## 2018-05-17 MED ORDER — NICOTINE 14 MG/24HR TD PT24
14.0000 mg | MEDICATED_PATCH | Freq: Every day | TRANSDERMAL | Status: DC
  Administered 2018-05-17: 14 mg via TRANSDERMAL
  Filled 2018-05-17: qty 1

## 2018-05-17 SURGICAL SUPPLY — 12 items
CATH INFINITI 5 FR JL3.5 (CATHETERS) ×1 IMPLANT
CATH INFINITI 5FR ANG PIGTAIL (CATHETERS) ×1 IMPLANT
CATH INFINITI JR4 5F (CATHETERS) ×1 IMPLANT
DEVICE RAD COMP TR BAND LRG (VASCULAR PRODUCTS) ×1 IMPLANT
GLIDESHEATH SLEND SS 6F .021 (SHEATH) ×1 IMPLANT
GUIDEWIRE INQWIRE 1.5J.035X260 (WIRE) IMPLANT
INQWIRE 1.5J .035X260CM (WIRE) ×2
KIT HEART LEFT (KITS) ×2 IMPLANT
PACK CARDIAC CATHETERIZATION (CUSTOM PROCEDURE TRAY) ×2 IMPLANT
SYR MEDRAD MARK V 150ML (SYRINGE) ×2 IMPLANT
TRANSDUCER W/STOPCOCK (MISCELLANEOUS) ×2 IMPLANT
TUBING CIL FLEX 10 FLL-RA (TUBING) ×2 IMPLANT

## 2018-05-17 NOTE — ED Triage Notes (Signed)
Pt was at work when med chest pain occurred.

## 2018-05-17 NOTE — Consult Note (Addendum)
Cardiology Consult    Patient ID: [name]; [id number]; 1975/10/04   Admit date: 05/17/2018 Date of Consult: 05/17/2018  Primary Care Provider: Eartha Inch, MD Primary Cardiologist: New to [facility] - Dr. Diona Browner  Patient Profile    [name] is a 42 y.o. female with past medical history of HTN, HLD, Type 2 DM, and tobacco use who is being seen today for the evaluation of chest pain at the request of Dr. Sharl Ma.   History of Present Illness    [name] presented to [facility] ED during the early morning hours of 05/17/2018 for evaluation of chest pain which occurred while at work. Reports that over the past few days she has been experiencing increased fatigue but starting last night, she developed a pressure along her left pectoral region which radiated down her arm and into her shoulder blades bilaterally. Describes this as something sitting on her chest and reports symptoms did not resemble her prior episodes of GERD. She experienced associated nausea and diaphoresis with the event. Upon arrival to the ED, she was given SL NTG with improvement in her symptoms. Reports still having pain along her shoulder blades but this has overall improved.   She reports having a stress test 10+ years ago which was normal at that time. Does have HTN, HLD, and Type 2 DM which is followed by her PCP. Her mother died from at MI at age 110. She does smoke 0.5 ppd and has done so for over 20 years but is trying to quit.   Initial labs showed WBC 4.9, Hgb 10.1 (baseline of ~ 11.0 in 2017 and 2018), platelets 272, Na+ 140, K+ 4.2, and creatinine 0.78.  Initial and delta troponin values have been negative with cyclic enzymes pending. FLP and Hgb A1c pending. CXR shows no active cardia pulmonary disease.  EKG shows NSR, HR 72, with up-sloping ST segment along the inferior and lateral leads which is similar to prior tracings dating back to 2016.   Past Medical History:  Diagnosis Date  . Asthma      no inhaler  . Chronic abdominal pain   . Depression   . Diabetes mellitus    on insulin during this pregnancy  . GERD (gastroesophageal reflux disease)   . Hyperlipidemia   . Hypertension   . Migraine headache     Past Surgical History:  Procedure Laterality Date  . CESAREAN SECTION WITH BILATERAL TUBAL LIGATION  07/18/2012   Procedure: CESAREAN SECTION WITH BILATERAL TUBAL LIGATION;  Surgeon: Mickel Baas, MD;  Location: [facility];  Service: Obstetrics;  Laterality: Bilateral;  . CHOLECYSTECTOMY    . WISDOM TOOTH EXTRACTION       Home Medications:  Prior to Admission medications   Medication Sig Start Date End Date Taking? Authorizing Provider  acetaminophen (TYLENOL) 500 MG tablet Take 500 mg by mouth every 6 (six) hours as needed for mild pain.    [provider]  buPROPion (WELLBUTRIN SR) 150 MG 12 hr tablet Take 150 mg by mouth 2 (two) times daily.    [provider]  EPINEPHrine 0.3 mg/0.3 mL IJ SOAJ injection Inject 0.3 mg into the muscle once as needed (allergic reaction).  04/09/13   [provider]  FLUoxetine (PROZAC) 20 MG capsule Take 20 mg by mouth daily. 11/27/14 08/20/17  [provider]  ibuprofen (ADVIL,MOTRIN) 200 MG tablet Take 800 mg by mouth every 8 (eight) hours as needed. pain    [provider]  lisinopril (PRINIVIL,ZESTRIL) 5 MG tablet Take 5 mg by mouth daily.    [provider]  metFORMIN (GLUCOPHAGE-XR) 500 MG 24 hr tablet Take 500 mg by mouth 2 (two) times daily.    [provider]  omeprazole (PRILOSEC) 40 MG capsule Take 40 mg by mouth 2 (two) times daily.    [provider]  ondansetron (ZOFRAN ODT) 4 MG disintegrating tablet Take 1 tablet (4 mg total) by mouth every 8 (eight) hours as needed for nausea or vomiting. 08/20/17   Rancour, Jeannett Senior, MD  simvastatin (ZOCOR) 20 MG tablet Take 20 mg by mouth daily.    [provider]  topiramate (TOPAMAX) 200 MG tablet Take 200 mg  by mouth 2 (two) times daily.    [provider]  lisinopril-hydrochlorothiazide (PRINZIDE,ZESTORETIC) 20-12.5 MG per tablet Take 1 tablet by mouth daily.    12/09/11  [provider]  metoprolol succinate (TOPROL-XL) 50 MG 24 hr tablet Take 50 mg by mouth daily. Take with or immediately following a meal.  12/09/11  [provider]  promethazine (PHENERGAN) 25 MG tablet Take 1 tablet (25 mg total) by mouth every 6 (six) hours as needed for nausea. 11/08/11 01/22/15  Anselm Aumiller Jester, DO  trazodone (DESYREL) 300 MG tablet Take 300 mg by mouth at bedtime.    12/09/11  [provider]    Inpatient Medications: Scheduled Meds: . buPROPion  150 mg Oral BID  . insulin aspart  0-9 Units Subcutaneous TID WC  . lisinopril  5 mg Oral Daily  . nicotine  14 mg Transdermal Daily  . pantoprazole  40 mg Oral Daily  . simvastatin  20 mg Oral Daily  . topiramate  200 mg Oral BID   Continuous Infusions: . heparin 1,100 Units/hr (05/17/18 0554)   PRN Meds: acetaminophen, ondansetron **OR** ondansetron (ZOFRAN) IV  Allergies:    Allergies  Allergen Reactions  . Imitrex [Sumatriptan Base] Palpitations  . Levofloxacin Anaphylaxis  . Shellfish Allergy Anaphylaxis and Rash  . Sulfa Antibiotics Anaphylaxis and Rash  . Tequin Anaphylaxis  . Betadine [Povidone Iodine] Rash and Other (See Comments)    Blistering of skin    Social History:   Social History   Socioeconomic History  . Marital status: Married    Spouse name: Not on file  . Number of children: Not on file  . Years of education: Not on file  . Highest education level: Not on file  Occupational History  . Not on file  Social Needs  . Financial resource strain: Not on file  . Food insecurity:    Worry: Not on file    Inability: Not on file  . Transportation needs:    Medical: Not on file    Non-medical: Not on file  Tobacco Use  . Smoking status: Current Every Day Smoker    Packs/day: 0.50     Types: Cigarettes  . Smokeless tobacco: Never Used  Substance and Sexual Activity  . Alcohol use: No  . Drug use: No  . Sexual activity: Yes    Birth control/protection: Surgical  Lifestyle  . Physical activity:    Days per week: Not on file    Minutes per session: Not on file  . Stress: Not on file  Relationships  . Social connections:    Talks on phone: Not on file    Gets together: Not on file    Attends religious service: Not on file    Active member of club or organization:  Not on file    Attends meetings of clubs or organizations: Not on file    Relationship status: Not on file  . Intimate partner violence:    Fear of current or ex partner: Not on file    Emotionally abused: Not on file    Physically abused: Not on file    Forced sexual activity: Not on file  Other Topics Concern  . Not on file  Social History Narrative  . Not on file     Family History:    Family History  Problem Relation Age of Onset  . Heart disease Mother   . COPD Mother   . Hypertension Mother   . Hyperlipidemia Mother   . Hearing loss Maternal Grandmother       Review of Systems    General:  No chills, fever, night sweats or weight changes.  Cardiovascular:  No edema, orthopnea, palpitations, paroxysmal nocturnal dyspnea. Positive for chest pain and dyspnea on exertion.  Dermatological: No rash, lesions/masses Respiratory: No cough, dyspnea Urologic: No hematuria, dysuria Abdominal:   No nausea, vomiting, diarrhea, bright red blood per rectum, melena, or hematemesis Neurologic:  No visual changes, wkns, changes in mental status.  All other systems reviewed and are otherwise negative except as noted above.  Physical Exam/Data    Vitals:   05/17/18 0330 05/17/18 0445 05/17/18 0447 05/17/18 0530  BP: 119/83 (!) 142/128 (!) 138/94 (!) 138/96  Pulse: 63 65 (!) 56 66  Resp: 15 15 19 13   Temp:      TempSrc:      SpO2: 98% 100% 100% 97%  Weight:      Height:       No intake or  output data in the 24 hours ending 05/17/18 0740 Filed Weights   05/17/18 0038  Weight: 97.1 kg   Body mass index is 31.6 kg/m.   General: Pleasant, Caucasian female appearing in NAD Psych: Normal affect. Neuro: Alert and oriented X 3. Moves all extremities spontaneously. HEENT: Normal  Neck: Supple without bruits or JVD. Lungs:  Resp regular and unlabored, CTA without wheezing or rales. Heart: RRR no s3, s4, or murmurs. Abdomen: Soft, non-tender, non-distended, BS + x 4.  Extremities: No clubbing, cyanosis or lower extremity edema. DP/PT/Radials 2+ and equal bilaterally.   EKG:  The EKG was personally reviewed and demonstrates:  NSR, HR 72, with up-sloping ST segment along the inferior and lateral leads which is similar to prior tracings dating back to 2016.  Telemetry:  Telemetry was personally reviewed and demonstrates: NSR, HR in mid-50's to 60's with no significant arrhythmias.    Labs/Studies     Relevant CV Studies:  None on File.   Laboratory Data:  Chemistry Recent Labs  Lab 05/17/18 0109  NA 140  K 4.2  CL 111  CO2 23  GLUCOSE 116*  BUN 13  CREATININE 0.78  CALCIUM 8.8*  GFRNONAA >60  GFRAA >60  ANIONGAP 6    No results for input(s): PROT, ALBUMIN, AST, ALT, ALKPHOS, BILITOT in the last 168 hours. Hematology Recent Labs  Lab 05/17/18 0109  WBC 4.9  RBC 3.84*  HGB 10.1*  HCT 32.8*  MCV 85.4  MCH 26.3  MCHC 30.8  RDW 15.9*  PLT 272   Cardiac Enzymes Recent Labs  Lab 05/17/18 0109 05/17/18 0357  TROPONINI <0.03 <0.03    Recent Labs  Lab 05/17/18 0111  TROPIPOC 0.01    BNPNo results for input(s): BNP, PROBNP in the last 168 hours.  DDimer No results for input(s): DDIMER in the last 168 hours.  Radiology/Studies:  Dg Chest 2 View  Result Date: 05/17/2018 CLINICAL DATA:  42 year old female with chest pain. EXAM: CHEST - 2 VIEW COMPARISON:  Chest radiograph dated 12/25/2016 FINDINGS: The heart size and mediastinal contours are  within normal limits. Both lungs are clear. The visualized skeletal structures are unremarkable. IMPRESSION: No active cardiopulmonary disease. Electronically Signed   By: Elgie Collard M.D.   On: 05/17/2018 01:27     Assessment & Plan    1. Chest Pain concerning for Unstable Angina - the patient presents with an episode of chest pain which started when walking across a parking lot. Developed pressure along her left pectoral region which radiated into her shoulder blades and down her left arm. Was also very diaphoretic and nauseated with the event. Symptoms improved with SL NTG upon arrival to the ED. - she has no known history of CAD but has multiple cardiac risk factors including HTN, HLD, Type 2 DM, family history of CAD (mother died from at MI at age 23) and continued tobacco use.  - Initial and delta troponin values have been negative with cyclic enzymes pending. EKG shows NSR, HR 72, with up-sloping ST segment along the inferior and lateral leads which is similar to prior tracings dating back to 2016. Continue to cycle enzymes. She has been started on Heparin. Will review with Dr. Diona Browner but would anticipate a cardiac catheterization for definitive evaluation given her presenting symptoms and multiple risk factors. A CTA had been ordered by the admitting team but will review with them to see if this can be tomorrow if cath is unrevealing to reduce the amount of contrast dye administered in one day.  - continue statin therapy. Will start ASA 81mg  daily. No BB given episodes of bradycardia.   2. HTN - BP has been variable at 119/80 to 142/128 since admission. Improved to 138/96 on most recent check. - continue Lisinopril 5mg  daily.   3. HLD - FLP pending. Continue PTA Simvastatin 20mg  daily.   4. IDDM - fasting glucose 91 this AM. Hgb A1c pending. On Metformin as an outpatient. Receiving SSI while admitted.   5. Anemia - Hgb 10.1 on admission (baseline of ~ 11.0 in 2017 and 2018).  Reports having a heavy menstrual flow at this time. Denies any other evidence of active bleeding.      For questions or updates, please contact [facility] Please consult [email or web address] for contact info under Cardiology/STEMI.  Signed, Ellsworth Lennox, PA-C 05/17/2018, 7:40 AM Pager: [phone]   Attending note:  Patient seen and examined.  I reviewed her records and discussed the case with Ms. Iran Ouch PA-C.  Ms. Marte presents to the hospital with recent onset left upper chest and upper posterior thoracic discomfort around her shoulder blades.  She does have reflux symptoms, but states that these were not similar.  This occurred while she was walking across the parking lot to her job last night.  She has multiple cardiac risk factors including hyperlipidemia, hypertension, type 2 diabetes mellitus, long-standing tobacco abuse, and premature CAD in the family, her mother died with a heart attack at age 10.  Under hospital observation she has had recurring thoracic discomfort although troponin levels have been negative so far and ECG shows sinus rhythm with probable early repolarization (no reciprocal ST segment depression noted).  Chest x-ray shows no acute process, no widened mediastinum.  Blood pressure not markedly elevated.  On examination she is  in no distress.  Systolic blood pressure 130s, heart rate in the 50s to 60s in sinus rhythm by telemetry which I personally reviewed.  Lungs are clear without labored breathing.  Cardiac exam reveals RRR without gallop or pericardial rub.  Lab work shows potassium 3.8, BUN 10, creatinine 0.75, normal LFTs, normal troponin I levels, LDL 94, hemoglobin 10.0, platelets 275.  Patient presents with recent onset thoracic discomfort as outlined, concerning for unstable angina in light of robust cardiac risk factor profile and premature CAD in her mother, although troponin I levels are negative and her ECG is overall nonspecific.  She does have  some pain in her upper thoracic and subscapular region, aortic dissection is not highly suspected at this point however.  No wide mediastinum on CXR, blood pressure not markedly elevated.  Plan at this point is to continue heparin, [facility], and proceed with a diagnostic cardiac catheterization.  If she does not have significant CAD, would consider either an aortic root injection or chest CTA for further evaluation.  Jonelle SidleSamuel G. Poet Hineman, M.D., F.A.C.C.

## 2018-05-17 NOTE — Plan of Care (Signed)

## 2018-05-17 NOTE — Progress Notes (Signed)
ANTICOAGULATION CONSULT NOTE - Follow Up Consult  Pharmacy Consult for heparin Indication: chest pain/ACS  Allergies  Allergen Reactions  . Imitrex [Sumatriptan Base] Palpitations  . Levofloxacin Anaphylaxis  . Shellfish Allergy Anaphylaxis and Rash  . Sulfa Antibiotics Anaphylaxis and Rash  . Tequin Anaphylaxis  . Betadine [Povidone Iodine] Rash and Other (See Comments)    Blistering of skin    Patient Measurements: Height: _0  (175.3 cm) Weight: 214 lb (97.1 kg) IBW/kg (Calculated) : 66.2 Heparin Dosing Weight: 87 kg  Vital Signs: BP: 138/96 (08/28 0530) Pulse Rate: 66 (08/28 0530)  Labs: Recent Labs    05/17/18 0109 05/17/18 0357 05/17/18 0524 05/17/18 0653 05/17/18 1157  HGB 10.1*  --   --  10.0*  --   HCT 32.8*  --   --  33.1*  --   PLT 272  --   --  275  --   APTT  --   --  26  --   --   LABPROT  --   --  12.3  --   --   INR  --   --  0.92  --   --   HEPARINUNFRC  --   --   --   --  0.18*  CREATININE 0.78  --   --  0.75  --   TROPONINI <0.03 <0.03  --  <0.03 <0.03    Estimated Creatinine Clearance: 111.4 mL/min (by C-G formula based on SCr of 0.75 mg/dL).   Medications:  Medications Prior to Admission  Medication Sig Dispense Refill Last Dose  . acetaminophen (TYLENOL) 500 MG tablet Take 500 mg by mouth every 6 (six) hours as needed for mild pain.   05/16/2018 at Unknown time  . buPROPion (WELLBUTRIN SR) 150 MG 12 hr tablet Take 150 mg by mouth 2 (two) times daily.   05/16/2018 at Unknown time  . cetirizine (ZYRTEC) 10 MG tablet Take 1 tablet by mouth daily.   05/16/2018 at Unknown time  . FLUoxetine (PROZAC) 20 MG capsule Take 40 mg by mouth daily.    Past Week at Unknown time  . fluticasone (FLONASE) 50 MCG/ACT nasal spray Place 2 sprays into both nostrils 2 (two) times daily.   Past Week at Unknown time  . hydrocortisone (ANUSOL-HC) 25 MG suppository Place 1 suppository rectally as needed.     Marland Kitchen ibuprofen (ADVIL,MOTRIN) 200 MG tablet Take 800 mg by  mouth every 8 (eight) hours as needed. pain   05/16/2018 at Unknown time  . lisinopril (PRINIVIL,ZESTRIL) 40 MG tablet Take 1 tablet by mouth daily.   05/16/2018 at Unknown time  . metformin (FORTAMET) 500 MG (OSM) 24 hr tablet Take 2 tablets by mouth 2 (two) times daily.   05/16/2018 at Unknown time  . Nicotine 21-14-7 MG/24HR KIT Apply 1 patch topically daily.   05/16/2018 at Unknown time  . omeprazole (PRILOSEC) 40 MG capsule Take 40 mg by mouth 2 (two) times daily.   05/16/2018 at Unknown time  . ondansetron (ZOFRAN ODT) 4 MG disintegrating tablet Take 1 tablet (4 mg total) by mouth every 8 (eight) hours as needed for nausea or vomiting. 20 tablet 0 05/16/2018 at Unknown time  . simvastatin (ZOCOR) 20 MG tablet Take 20 mg by mouth daily.   Past Month at Unknown time  . topiramate (TOPAMAX) 200 MG tablet Take 200 mg by mouth 2 (two) times daily.   05/16/2018 at Unknown time  . amoxicillin-clavulanate (AUGMENTIN) 875-125 MG tablet Take 1 tablet by mouth  2 (two) times daily.   Completed Course at Unknown time  . EPINEPHrine 0.3 mg/0.3 mL IJ SOAJ injection Inject 0.3 mg into the muscle once as needed (allergic reaction).    unknown    Assessment: 42 yo female presented to ED with chest pain. PMH significant for HTN, HLD, DM.  Starting heparin for bhest pain concerning for unstable angina.  Initial troponin negative.  [facility].  Goal of Therapy:  Heparin level 0.3-0.7 units/ml Monitor platelets by anticoagulation protocol: Yes   Plan:  Rebolus heparin 2500 units x 1 Increase heparin infusion to 1400 units/hr Check anti-Xa level in 6 hours and daily while on heparin Continue to monitor H&H and platelets   Margot Ables, PharmD Clinical Pharmacist 05/17/2018 1:04 PM

## 2018-05-17 NOTE — H&P (View-Only) (Signed)
  Cardiology Consult    Patient ID: [name]; [id number]; 03/30/1976   Admit date: 05/17/2018 Date of Consult: 05/17/2018  Primary Care Provider: Badger, Michael C, MD Primary Cardiologist: New to [facility] - Dr. McDowell  Patient Profile    [name] is a 42 y.o. female with past medical history of HTN, HLD, Type 2 DM, and tobacco use who is being seen today for the evaluation of chest pain at the request of Dr. Lama.   History of Present Illness    [name] presented to [place] ED during the early morning hours of 05/17/2018 for evaluation of chest pain which occurred while at work. Reports that over the past few days she has been experiencing increased fatigue but starting last night, she developed a pressure along her left pectoral region which radiated down her arm and into her shoulder blades bilaterally. Describes this as something sitting on her chest and reports symptoms did not resemble her prior episodes of GERD. She experienced associated nausea and diaphoresis with the event. Upon arrival to the ED, she was given SL NTG with improvement in her symptoms. Reports still having pain along her shoulder blades but this has overall improved.   She reports having a stress test 10+ years ago which was normal at that time. Does have HTN, HLD, and Type 2 DM which is followed by her PCP. Her mother died from at MI at age 58. She does smoke 0.5 ppd and has done so for over 20 years but is trying to quit.   Initial labs showed WBC 4.9, Hgb 10.1 (baseline of ~ 11.0 in 2017 and 2018), platelets 272, Na+ 140, K+ 4.2, and creatinine 0.78.  Initial and delta troponin values have been negative with cyclic enzymes pending. FLP and Hgb A1c pending. CXR shows no active cardia pulmonary disease.  EKG shows NSR, HR 72, with up-sloping ST segment along the inferior and lateral leads which is similar to prior tracings dating back to 2016.   Past Medical History:  Diagnosis Date  . Asthma      no inhaler  . Chronic abdominal pain   . Depression   . Diabetes mellitus    on insulin during this pregnancy  . GERD (gastroesophageal reflux disease)   . Hyperlipidemia   . Hypertension   . Migraine headache     Past Surgical History:  Procedure Laterality Date  . CESAREAN SECTION WITH BILATERAL TUBAL LIGATION  07/18/2012   Procedure: CESAREAN SECTION WITH BILATERAL TUBAL LIGATION;  Surgeon: Richard D Kaplan, MD;  Location: [facility];  Service: Obstetrics;  Laterality: Bilateral;  . CHOLECYSTECTOMY    . WISDOM TOOTH EXTRACTION       Home Medications:  Prior to Admission medications   Medication Sig Start Date End Date Taking? Authorizing Provider  acetaminophen (TYLENOL) 500 MG tablet Take 500 mg by mouth every 6 (six) hours as needed for mild pain.    [provider]  buPROPion (WELLBUTRIN SR) 150 MG 12 hr tablet Take 150 mg by mouth 2 (two) times daily.    [provider]  EPINEPHrine 0.3 mg/0.3 mL IJ SOAJ injection Inject 0.3 mg into the muscle once as needed (allergic reaction).  04/09/13   [provider]  FLUoxetine (PROZAC) 20 MG capsule Take 20 mg by mouth daily. 11/27/14 08/20/17  [provider]  ibuprofen (ADVIL,MOTRIN) 200 MG tablet Take 800 mg by mouth every 8 (eight) hours as needed. pain    [provider]  lisinopril (PRINIVIL,ZESTRIL) 5 MG tablet Take 5 mg by mouth daily.    [provider]  metFORMIN (GLUCOPHAGE-XR) 500 MG 24 hr tablet Take 500 mg by mouth 2 (two) times daily.    [provider]  omeprazole (PRILOSEC) 40 MG capsule Take 40 mg by mouth 2 (two) times daily.    [provider]  ondansetron (ZOFRAN ODT) 4 MG disintegrating tablet Take 1 tablet (4 mg total) by mouth every 8 (eight) hours as needed for nausea or vomiting. 08/20/17   Rancour, Stephen, MD  simvastatin (ZOCOR) 20 MG tablet Take 20 mg by mouth daily.    [provider]  topiramate (TOPAMAX) 200 MG tablet Take 200 mg  by mouth 2 (two) times daily.    [provider]  lisinopril-hydrochlorothiazide (PRINZIDE,ZESTORETIC) 20-12.5 MG per tablet Take 1 tablet by mouth daily.    12/09/11  [provider]  metoprolol succinate (TOPROL-XL) 50 MG 24 hr tablet Take 50 mg by mouth daily. Take with or immediately following a meal.  12/09/11  [provider]  promethazine (PHENERGAN) 25 MG tablet Take 1 tablet (25 mg total) by mouth every 6 (six) hours as needed for nausea. 11/08/11 01/22/15  McManus, Kathleen, DO  trazodone (DESYREL) 300 MG tablet Take 300 mg by mouth at bedtime.    12/09/11  [provider]    Inpatient Medications: Scheduled Meds: . buPROPion  150 mg Oral BID  . insulin aspart  0-9 Units Subcutaneous TID WC  . lisinopril  5 mg Oral Daily  . nicotine  14 mg Transdermal Daily  . pantoprazole  40 mg Oral Daily  . simvastatin  20 mg Oral Daily  . topiramate  200 mg Oral BID   Continuous Infusions: . heparin 1,100 Units/hr (05/17/18 0554)   PRN Meds: acetaminophen, ondansetron **OR** ondansetron (ZOFRAN) IV  Allergies:    Allergies  Allergen Reactions  . Imitrex [Sumatriptan Base] Palpitations  . Levofloxacin Anaphylaxis  . Shellfish Allergy Anaphylaxis and Rash  . Sulfa Antibiotics Anaphylaxis and Rash  . Tequin Anaphylaxis  . Betadine [Povidone Iodine] Rash and Other (See Comments)    Blistering of skin    Social History:   Social History   Socioeconomic History  . Marital status: Married    Spouse name: Not on file  . Number of children: Not on file  . Years of education: Not on file  . Highest education level: Not on file  Occupational History  . Not on file  Social Needs  . Financial resource strain: Not on file  . Food insecurity:    Worry: Not on file    Inability: Not on file  . Transportation needs:    Medical: Not on file    Non-medical: Not on file  Tobacco Use  . Smoking status: Current Every Day Smoker    Packs/day: 0.50     Types: Cigarettes  . Smokeless tobacco: Never Used  Substance and Sexual Activity  . Alcohol use: No  . Drug use: No  . Sexual activity: Yes    Birth control/protection: Surgical  Lifestyle  . Physical activity:    Days per week: Not on file    Minutes per session: Not on file  . Stress: Not on file  Relationships  . Social connections:    Talks on phone: Not on file    Gets together: Not on file    Attends religious service: Not on file    Active member of club or organization:   Not on file    Attends meetings of clubs or organizations: Not on file    Relationship status: Not on file  . Intimate partner violence:    Fear of current or ex partner: Not on file    Emotionally abused: Not on file    Physically abused: Not on file    Forced sexual activity: Not on file  Other Topics Concern  . Not on file  Social History Narrative  . Not on file     Family History:    Family History  Problem Relation Age of Onset  . Heart disease Mother   . COPD Mother   . Hypertension Mother   . Hyperlipidemia Mother   . Hearing loss Maternal Grandmother       Review of Systems    General:  No chills, fever, night sweats or weight changes.  Cardiovascular:  No edema, orthopnea, palpitations, paroxysmal nocturnal dyspnea. Positive for chest pain and dyspnea on exertion.  Dermatological: No rash, lesions/masses Respiratory: No cough, dyspnea Urologic: No hematuria, dysuria Abdominal:   No nausea, vomiting, diarrhea, bright red blood per rectum, melena, or hematemesis Neurologic:  No visual changes, wkns, changes in mental status.  All other systems reviewed and are otherwise negative except as noted above.  Physical Exam/Data    Vitals:   05/17/18 0330 05/17/18 0445 05/17/18 0447 05/17/18 0530  BP: 119/83 (!) 142/128 (!) 138/94 (!) 138/96  Pulse: 63 65 (!) 56 66  Resp: 15 15 19 13  Temp:      TempSrc:      SpO2: 98% 100% 100% 97%  Weight:      Height:       No intake or  output data in the 24 hours ending 05/17/18 0740 Filed Weights   05/17/18 0038  Weight: 97.1 kg   Body mass index is 31.6 kg/m.   General: Pleasant, Caucasian female appearing in NAD Psych: Normal affect. Neuro: Alert and oriented X 3. Moves all extremities spontaneously. HEENT: Normal  Neck: Supple without bruits or JVD. Lungs:  Resp regular and unlabored, CTA without wheezing or rales. Heart: RRR no s3, s4, or murmurs. Abdomen: Soft, non-tender, non-distended, BS + x 4.  Extremities: No clubbing, cyanosis or lower extremity edema. DP/PT/Radials 2+ and equal bilaterally.   EKG:  The EKG was personally reviewed and demonstrates:  NSR, HR 72, with up-sloping ST segment along the inferior and lateral leads which is similar to prior tracings dating back to 2016.  Telemetry:  Telemetry was personally reviewed and demonstrates: NSR, HR in mid-50's to 60's with no significant arrhythmias.    Labs/Studies     Relevant CV Studies:  None on File.   Laboratory Data:  Chemistry Recent Labs  Lab 05/17/18 0109  NA 140  K 4.2  CL 111  CO2 23  GLUCOSE 116*  BUN 13  CREATININE 0.78  CALCIUM 8.8*  GFRNONAA >60  GFRAA >60  ANIONGAP 6    No results for input(s): PROT, ALBUMIN, AST, ALT, ALKPHOS, BILITOT in the last 168 hours. Hematology Recent Labs  Lab 05/17/18 0109  WBC 4.9  RBC 3.84*  HGB 10.1*  HCT 32.8*  MCV 85.4  MCH 26.3  MCHC 30.8  RDW 15.9*  PLT 272   Cardiac Enzymes Recent Labs  Lab 05/17/18 0109 05/17/18 0357  TROPONINI <0.03 <0.03    Recent Labs  Lab 05/17/18 0111  TROPIPOC 0.01    BNPNo results for input(s): BNP, PROBNP in the last 168 hours.    DDimer No results for input(s): DDIMER in the last 168 hours.  Radiology/Studies:  Dg Chest 2 View  Result Date: 05/17/2018 CLINICAL DATA:  42-year-old female with chest pain. EXAM: CHEST - 2 VIEW COMPARISON:  Chest radiograph dated 12/25/2016 FINDINGS: The heart size and mediastinal contours are  within normal limits. Both lungs are clear. The visualized skeletal structures are unremarkable. IMPRESSION: No active cardiopulmonary disease. Electronically Signed   By: Arash  Radparvar M.D.   On: 05/17/2018 01:27     Assessment & Plan    1. Chest Pain concerning for Unstable Angina - the patient presents with an episode of chest pain which started when walking across a parking lot. Developed pressure along her left pectoral region which radiated into her shoulder blades and down her left arm. Was also very diaphoretic and nauseated with the event. Symptoms improved with SL NTG upon arrival to the ED. - she has no known history of CAD but has multiple cardiac risk factors including HTN, HLD, Type 2 DM, family history of CAD (mother died from at MI at age 58) and continued tobacco use.  - Initial and delta troponin values have been negative with cyclic enzymes pending. EKG shows NSR, HR 72, with up-sloping ST segment along the inferior and lateral leads which is similar to prior tracings dating back to 2016. Continue to cycle enzymes. She has been started on Heparin. Will review with Dr. McDowell but would anticipate a cardiac catheterization for definitive evaluation given her presenting symptoms and multiple risk factors. A CTA had been ordered by the admitting team but will review with them to see if this can be tomorrow if cath is unrevealing to reduce the amount of contrast dye administered in one day.  - continue statin therapy. Will start ASA 81mg daily. No BB given episodes of bradycardia.   2. HTN - BP has been variable at 119/80 to 142/128 since admission. Improved to 138/96 on most recent check. - continue Lisinopril 5mg daily.   3. HLD - FLP pending. Continue PTA Simvastatin 20mg daily.   4. IDDM - fasting glucose 91 this AM. Hgb A1c pending. On Metformin as an outpatient. Receiving SSI while admitted.   5. Anemia - Hgb 10.1 on admission (baseline of ~ 11.0 in 2017 and 2018).  Reports having a heavy menstrual flow at this time. Denies any other evidence of active bleeding.      For questions or updates, please contact [facility] Please consult [email or web address] for contact info under Cardiology/STEMI.  Signed, Brittany M Strader, PA-C 05/17/2018, 7:40 AM Pager: [phone]   Attending note:  Patient seen and examined.  I reviewed her records and discussed the case with Ms. Strader PA-C.  Ms. Hacker presents to the hospital with recent onset left upper chest and upper posterior thoracic discomfort around her shoulder blades.  She does have reflux symptoms, but states that these were not similar.  This occurred while she was walking across the parking lot to her job last night.  She has multiple cardiac risk factors including hyperlipidemia, hypertension, type 2 diabetes mellitus, long-standing tobacco abuse, and premature CAD in the family, her mother died with a heart attack at age 58.  Under hospital observation she has had recurring thoracic discomfort although troponin levels have been negative so far and ECG shows sinus rhythm with probable early repolarization (no reciprocal ST segment depression noted).  Chest x-ray shows no acute process, no widened mediastinum.  Blood pressure not markedly elevated.  On examination she is   in no distress.  Systolic blood pressure 130s, heart rate in the 50s to 60s in sinus rhythm by telemetry which I personally reviewed.  Lungs are clear without labored breathing.  Cardiac exam reveals RRR without gallop or pericardial rub.  Lab work shows potassium 3.8, BUN 10, creatinine 0.75, normal LFTs, normal troponin I levels, LDL 94, hemoglobin 10.0, platelets 275.  Patient presents with recent onset thoracic discomfort as outlined, concerning for unstable angina in light of robust cardiac risk factor profile and premature CAD in her mother, although troponin I levels are negative and her ECG is overall nonspecific.  She does have  some pain in her upper thoracic and subscapular region, aortic dissection is not highly suspected at this point however.  No wide mediastinum on CXR, blood pressure not markedly elevated.  Plan at this point is to continue heparin, transfer to [facility], and proceed with a diagnostic cardiac catheterization.  If she does not have significant CAD, would consider either an aortic root injection or chest CTA for further evaluation.  Samuel G. McDowell, M.D., F.A.C.C.  

## 2018-05-17 NOTE — H&P (Signed)
[facility] H&P    Patient Demographics:    [name], is a 42 y.o. female  MRN: [id number]  DOB - [birth year]  Admit Date - 05/17/2018  Referring MD/NP/PA: Dr. Rhunette CroftNanavati  Outpatient Primary MD for the patient is Eartha InchBadger, Michael C, MD  Patient coming from: Home  Chief complaint-chest pain   HPI:    [name]  is a 42 y.o. female, with history of hypertension, hyperlipidemia, diabetes mellitus came to hospital with chest pain.  Patient says that she developed pain when she was at work felt like a squeezing sensation and heavy weight on the chest.  She also admits of nausea but no vomiting.  No diarrhea.  Denies abdominal pain. Mother passed away in the 1850s with a heart attack. In the ED, troponin is negative Denies shortness of breath Denies fever chills.   Review of systems:      All other systems reviewed and are negative.   With Past History of the following :    Past Medical History:  Diagnosis Date  . Asthma    no inhaler  . Chronic abdominal pain   . Depression   . Diabetes mellitus    on insulin during this pregnancy  . GERD (gastroesophageal reflux disease)   . Hyperlipidemia   . Hypertension   . Migraine headache       Past Surgical History:  Procedure Laterality Date  . CESAREAN SECTION WITH BILATERAL TUBAL LIGATION  07/18/2012   Procedure: CESAREAN SECTION WITH BILATERAL TUBAL LIGATION;  Surgeon: Mickel Baasichard D Kaplan, MD;  Location: [facility];  Service: Obstetrics;  Laterality: Bilateral;  . CHOLECYSTECTOMY    . WISDOM TOOTH EXTRACTION        Social History:      Social History   Tobacco Use  . Smoking status: Current Every Day Smoker    Packs/day: 0.50    Types: Cigarettes  . Smokeless tobacco: Never Used  Substance Use Topics  . Alcohol use: No       Family History :     Family History  Problem Relation Age of Onset  . Heart disease Mother   . COPD Mother     . Hypertension Mother   . Hyperlipidemia Mother   . Hearing loss Maternal Grandmother       Home Medications:   Prior to Admission medications   Medication Sig Start Date End Date Taking? Authorizing Provider  acetaminophen (TYLENOL) 500 MG tablet Take 500 mg by mouth every 6 (six) hours as needed for mild pain.    [provider]  buPROPion (WELLBUTRIN SR) 150 MG 12 hr tablet Take 150 mg by mouth 2 (two) times daily.    [provider]  EPINEPHrine 0.3 mg/0.3 mL IJ SOAJ injection Inject 0.3 mg into the muscle once as needed (allergic reaction).  04/09/13   [provider]  FLUoxetine (PROZAC) 20 MG capsule Take 20 mg by mouth daily. 11/27/14 08/20/17  [provider]  ibuprofen (ADVIL,MOTRIN) 200 MG tablet Take 800 mg by mouth every 8 (eight) hours as  needed. pain    [provider]  lisinopril (PRINIVIL,ZESTRIL) 5 MG tablet Take 5 mg by mouth daily.    [provider]  metFORMIN (GLUCOPHAGE-XR) 500 MG 24 hr tablet Take 500 mg by mouth 2 (two) times daily.    [provider]  omeprazole (PRILOSEC) 40 MG capsule Take 40 mg by mouth 2 (two) times daily.    [provider]  ondansetron (ZOFRAN ODT) 4 MG disintegrating tablet Take 1 tablet (4 mg total) by mouth every 8 (eight) hours as needed for nausea or vomiting. 08/20/17   Rancour, Jeannett Senior, MD  simvastatin (ZOCOR) 20 MG tablet Take 20 mg by mouth daily.    [provider]  topiramate (TOPAMAX) 200 MG tablet Take 200 mg by mouth 2 (two) times daily.    [provider]  lisinopril-hydrochlorothiazide (PRINZIDE,ZESTORETIC) 20-12.5 MG per tablet Take 1 tablet by mouth daily.    12/09/11  [provider]  metoprolol succinate (TOPROL-XL) 50 MG 24 hr tablet Take 50 mg by mouth daily. Take with or immediately following a meal.  12/09/11  [provider]  promethazine (PHENERGAN) 25 MG tablet Take 1 tablet (25 mg total) by mouth every 6 (six) hours  as needed for nausea. 11/08/11 01/22/15  Samuel Jester, DO  trazodone (DESYREL) 300 MG tablet Take 300 mg by mouth at bedtime.    12/09/11  [provider]     Allergies:     Allergies  Allergen Reactions  . Imitrex [Sumatriptan Base] Palpitations  . Levofloxacin Anaphylaxis  . Shellfish Allergy Anaphylaxis and Rash  . Sulfa Antibiotics Anaphylaxis and Rash  . Tequin Anaphylaxis  . Betadine [Povidone Iodine] Rash and Other (See Comments)    Blistering of skin     Physical Exam:   Vitals  Blood pressure (!) 138/94, pulse (!) 56, temperature 98.1 F (36.7 C), temperature source Oral, resp. rate 19, height 5\' 9"  (1.753 m), weight 97.1 kg, last menstrual period 05/17/2018, SpO2 100 %.  1.  General: Appears in no acute distress  2. Psychiatric:  Intact judgement and  insight, awake alert, oriented x 3.  3. Neurologic: No focal neurological deficits, all cranial nerves intact.Strength 5/5 all 4 extremities, sensation intact all 4 extremities, plantars down going.  4. Eyes :  anicteric sclerae, moist conjunctivae with no lid lag. PERRLA.  5. ENMT:  Oropharynx clear with moist mucous membranes and good dentition  6. Neck:  supple, no cervical lymphadenopathy appriciated, No thyromegaly  7. Respiratory : Normal respiratory effort, good air movement bilaterally,clear to  auscultation bilaterally  8. Cardiovascular : RRR, no gallops, rubs or murmurs, no leg edema  9. Gastrointestinal:  Positive bowel sounds, abdomen soft, non-tender to palpation,no hepatosplenomegaly, no rigidity or guarding       10. Skin:  No cyanosis, normal texture and turgor, no rash, lesions or ulcers  11.Musculoskeletal:  Good muscle tone,  joints appear normal , no effusions,  normal range of motion    Data Review:    CBC Recent Labs  Lab 05/17/18 0109  WBC 4.9  HGB 10.1*  HCT 32.8*  PLT 272  MCV 85.4  MCH 26.3  MCHC 30.8  RDW 15.9*    ------------------------------------------------------------------------------------------------------------------  Chemistries  Recent Labs  Lab 05/17/18 0109  NA 140  K 4.2  CL 111  CO2 23  GLUCOSE 116*  BUN 13  CREATININE 0.78  CALCIUM 8.8*   ------------------------------------------------------------------------------------------------------------------  ------------------------------------------------------------------------------------------------------------------ GFR: Estimated Creatinine Clearance: 111.4 mL/min (by C-G formula based on SCr  of 0.78 mg/dL). Liver Function Tests: No results for input(s): AST, ALT, ALKPHOS, BILITOT, PROT, ALBUMIN in the last 168 hours. No results for input(s): LIPASE, AMYLASE in the last 168 hours. No results for input(s): AMMONIA in the last 168 hours. Coagulation Profile: No results for input(s): INR, PROTIME in the last 168 hours. Cardiac Enzymes: Recent Labs  Lab 05/17/18 0109 05/17/18 0357  TROPONINI <0.03 <0.03   BNP (last 3 results) No results for input(s): PROBNP in the last 8760 hours. HbA1C: No results for input(s): HGBA1C in the last 72 hours. CBG: No results for input(s): GLUCAP in the last 168 hours. Lipid Profile: No results for input(s): CHOL, HDL, LDLCALC, TRIG, CHOLHDL, LDLDIRECT in the last 72 hours. Thyroid Function Tests: No results for input(s): TSH, T4TOTAL, FREET4, T3FREE, THYROIDAB in the last 72 hours. Anemia Panel: No results for input(s): VITAMINB12, FOLATE, FERRITIN, TIBC, IRON, RETICCTPCT in the last 72 hours.  --------------------------------------------------------------------------------------------------------------- Urine analysis:    Component Value Date/Time   COLORURINE YELLOW 11/02/2016 0334   APPEARANCEUR HAZY (A) 11/02/2016 0334   LABSPEC 1.029 11/02/2016 0334   PHURINE 5.0 11/02/2016 0334   GLUCOSEU NEGATIVE 11/02/2016 0334   HGBUR NEGATIVE 11/02/2016 0334   BILIRUBINUR  NEGATIVE 11/02/2016 0334   KETONESUR NEGATIVE 11/02/2016 0334   PROTEINUR NEGATIVE 11/02/2016 0334   UROBILINOGEN 0.2 01/25/2015 2250   NITRITE NEGATIVE 11/02/2016 0334   LEUKOCYTESUR TRACE (A) 11/02/2016 0334      Imaging Results:    Dg Chest 2 View  Result Date: 05/17/2018 CLINICAL DATA:  42 year old female with chest pain. EXAM: CHEST - 2 VIEW COMPARISON:  Chest radiograph dated 12/25/2016 FINDINGS: The heart size and mediastinal contours are within normal limits. Both lungs are clear. The visualized skeletal structures are unremarkable. IMPRESSION: No active cardiopulmonary disease. Electronically Signed   By: Elgie Collard M.D.   On: 05/17/2018 01:27    My personal review of EKG: Rhythm NSR, nonspecific ST changes   Assessment & Plan:    Active Problems:   Chest pain   1. Chest pain-lower acute coronary syndrome, patient has significant risk factors for CAD.  Cardiology fellow was called at [facility] and he recommended patient to be kept at [facility] hospital with cardiology to see in a.m.  Will cycle troponin every 6 hours x3. 2. Diabetes mellitus-hold metformin, start sliding scale insulin NovoLog. 3. Hypertension-continue lisinopril, amlodipine. 4. Hyperlipidemia-continue Zocor    DVT Prophylaxis-   Lovenox   AM Labs Ordered, also please review Full Orders  Family Communication: Admission, patients condition and plan of care including tests being ordered have been discussed with the patient who indicate understanding and agree with the plan and Code Status.  Code Status: Full code  Admission status: Observation  Time spent in minutes : 60 minutes   Meredeth Ide M.D on 05/17/2018 at 5:01 AM  Between 7am to 7pm - Pager - [phone]. After 7pm go to [email or web address] - password [facility]  [facility] - Office  [phone]

## 2018-05-17 NOTE — ED Notes (Signed)
Pt stated that she took 2 alive before coming to the ER

## 2018-05-17 NOTE — Progress Notes (Addendum)
PROGRESS NOTE  [name]:[id number] DOB: 04-26-1976 DOA: 05/17/2018 PCP: Eartha Inch, MD  Brief History:  42 year old female with a history of hypertension, hyperlipidemia, diabetes mellitus, depression/anxiety presenting with chest discomfort that was substernal and moderate to severe in nature while she was walking from the parking lot to her place of employment around midnight on 05/17/2018.  The patient had some nausea and diaphoresis.  She denied any worsening shortness of breath, syncope but complained of some dizziness.  There is been no fevers, chills, vomiting, diarrhea, abdominal pain, dysuria, hematuria.  Patient has not had any changes in her medications, but states that she takes 8-12 ibuprofen tablets on a daily basis for chronic aches and pains.  As result of this, she has had to take Nexium twice daily and ranitidine--2 to 3 tablets daily.  Upon presentation, the patient was noted to have an EKG with some ST elevation in leads II, III, aVF, although her troponins have been negative.  The patient was started on a heparin drip.  Cardiology was consulted to assist with management.  The patient continues to have some sharp pains in between her shoulder blades as well as pain in her left breast area.  Assessment/Plan: Chest pain  -concerning for angina, ?unstable -Cardiology consult -numerous cardiac risk factors -Troponins negative x2 -Mother died at age 85 from MI -Continue aspirin -CT angiogram chest -Personally reviewed EKG--ST elevation, II, III, aVF--likely early repolarization; no reciprocal changes -Personally reviewed chest x-ray--no infiltrates or edema -continue heparin IV pending cardiology eval -suspect a component of GERD from NSAID overuse  Diabetes mellitus type 2 -11/03/2016 hemoglobin A1c 6.1 -Holding metformin -Check hemoglobin A1c -NovoLog sliding scale  Hyperlipidemia -Check lipid panel -Continue Zocor  Essential HTN -continue  lisinopril  Tobacco abuse I have discussed tobacco cessation with the patient.  I have counseled the patient regarding the negative impacts of continued tobacco use including but not limited to lung cancer, COPD, and cardiovascular disease.  I have discussed alternatives to tobacco and modalities that may help facilitate tobacco cessation including but not limited to biofeedback, hypnosis, and medications.  Total time spent with tobacco counseling was 4 minutes.  Anxiety/depression -Continue Wellbutrin and Topamax    Disposition Plan:   Home 8/28 or 8/29 if stable Family Communication:  No Family at bedside--Total time spent 35 minutes.  Greater than 50% spent face to face counseling and coordinating care. 9562 to 0725   Consultants: cardiology   Code Status:  FULL   DVT Prophylaxis:  IV Heparin    Procedures: As Listed in Progress Note Above  Antibiotics: None    Subjective: Patient continues to have sharp left breast discomfort and sharp pains in between her shoulder blades.  She denies shortness breath, nausea, vomiting, diarrhea, abdominal pain, fevers, chills,, abdominal pain, dysuria, hematuria.  Objective: Vitals:   05/17/18 0330 05/17/18 0445 05/17/18 0447 05/17/18 0530  BP: 119/83 (!) 142/128 (!) 138/94 (!) 138/96  Pulse: 63 65 (!) 56 66  Resp: 15 15 19 13   Temp:      TempSrc:      SpO2: 98% 100% 100% 97%  Weight:      Height:       No intake or output data in the 24 hours ending 05/17/18 0726 Weight change:  Exam:   General:  Pt is alert, follows commands appropriately, not in acute distress  HEENT: No icterus, No thrush, No neck mass, [place]/AT  Cardiovascular:  RRR, S1/S2, no rubs, no gallops  Respiratory: CTA bilaterally, no wheezing, no crackles, no rhonchi  Abdomen: Soft/+BS, non tender, non distended, no guarding  Extremities: No edema, No lymphangitis, No petechiae, No rashes, no synovitis   Data Reviewed: I have personally reviewed following  labs and imaging studies Basic Metabolic Panel: Recent Labs  Lab 05/17/18 0109  NA 140  K 4.2  CL 111  CO2 23  GLUCOSE 116*  BUN 13  CREATININE 0.78  CALCIUM 8.8*   Liver Function Tests: No results for input(s): AST, ALT, ALKPHOS, BILITOT, PROT, ALBUMIN in the last 168 hours. No results for input(s): LIPASE, AMYLASE in the last 168 hours. No results for input(s): AMMONIA in the last 168 hours. Coagulation Profile: Recent Labs  Lab 05/17/18 0524  INR 0.92   CBC: Recent Labs  Lab 05/17/18 0109  WBC 4.9  HGB 10.1*  HCT 32.8*  MCV 85.4  PLT 272   Cardiac Enzymes: Recent Labs  Lab 05/17/18 0109 05/17/18 0357  TROPONINI <0.03 <0.03   BNP: Invalid input(s): POCBNP CBG: No results for input(s): GLUCAP in the last 168 hours. HbA1C: No results for input(s): HGBA1C in the last 72 hours. Urine analysis:    Component Value Date/Time   COLORURINE YELLOW 11/02/2016 0334   APPEARANCEUR HAZY (A) 11/02/2016 0334   LABSPEC 1.029 11/02/2016 0334   PHURINE 5.0 11/02/2016 0334   GLUCOSEU NEGATIVE 11/02/2016 0334   HGBUR NEGATIVE 11/02/2016 0334   BILIRUBINUR NEGATIVE 11/02/2016 0334   KETONESUR NEGATIVE 11/02/2016 0334   PROTEINUR NEGATIVE 11/02/2016 0334   UROBILINOGEN 0.2 01/25/2015 2250   NITRITE NEGATIVE 11/02/2016 0334   LEUKOCYTESUR TRACE (A) 11/02/2016 0334   Sepsis Labs: @LABRCNTIP (procalcitonin:4,lacticidven:4) )No results found for this or any previous visit (from the past 240 hour(s)).   Scheduled Meds: . buPROPion  150 mg Oral BID  . insulin aspart  0-9 Units Subcutaneous TID WC  . lisinopril  5 mg Oral Daily  . nicotine  14 mg Transdermal Daily  . pantoprazole  40 mg Oral Daily  . simvastatin  20 mg Oral Daily  . topiramate  200 mg Oral BID   Continuous Infusions: . heparin 1,100 Units/hr (05/17/18 0554)    Procedures/Studies: Dg Chest 2 View  Result Date: 05/17/2018 CLINICAL DATA:  42 year old female with chest pain. EXAM: CHEST - 2 VIEW  COMPARISON:  Chest radiograph dated 12/25/2016 FINDINGS: The heart size and mediastinal contours are within normal limits. Both lungs are clear. The visualized skeletal structures are unremarkable. IMPRESSION: No active cardiopulmonary disease. Electronically Signed   By: Elgie CollardArash  Radparvar M.D.   On: 05/17/2018 01:27    Catarina Hartshornavid Shamyra Farias, DO  [facility] Pager [phone]  If 7PM-7AM, please contact night-coverage [email or web address] Password [email or web address] 05/17/2018, 7:26 AM   LOS: 0 days

## 2018-05-17 NOTE — Progress Notes (Signed)
ANTICOAGULATION CONSULT NOTE - Preliminary  Pharmacy Consult for heparin Indication: chest pain/ACS  Allergies  Allergen Reactions  . Imitrex [Sumatriptan Base] Palpitations  . Levofloxacin Anaphylaxis  . Shellfish Allergy Anaphylaxis and Rash  . Sulfa Antibiotics Anaphylaxis and Rash  . Tequin Anaphylaxis  . Betadine [Povidone Iodine] Rash and Other (See Comments)    Blistering of skin    Patient Measurements: Height: 5\' 9"  (175.3 cm) Weight: 214 lb (97.1 kg) IBW/kg (Calculated) : 66.2 HEPARIN DW (KG): 87   Vital Signs: Temp: 98.1 F (36.7 C) (08/28 0043) Temp Source: Oral (08/28 0043) BP: 138/94 (08/28 0447) Pulse Rate: 56 (08/28 0447)  Labs: Recent Labs    05/17/18 0109 05/17/18 0357  HGB 10.1*  --   HCT 32.8*  --   PLT 272  --   CREATININE 0.78  --   TROPONINI <0.03 <0.03   Estimated Creatinine Clearance: 111.4 mL/min (by C-G formula based on SCr of 0.78 mg/dL).  Medical History: Past Medical History:  Diagnosis Date  . Asthma    no inhaler  . Chronic abdominal pain   . Depression   . Diabetes mellitus    on insulin during this pregnancy  . GERD (gastroesophageal reflux disease)   . Hyperlipidemia   . Hypertension   . Migraine headache     Medications:  Scheduled:  . heparin  4,000 Units Intravenous Once  . pantoprazole  40 mg Oral Daily   Infusions:  . heparin      Assessment: 42 yo female presented to ED with chest pain. PMH significant for HTN, HLD, DM.  Starting heparin for lower acute coronary syndrome.  Will be admitted to [facility].   Goal of Therapy:  Heparin level 0.3-0.7 units/ml   Plan:  Give 4000 units bolus x 1 Start heparin infusion at 1100 units/hr Check anti-Xa level in 6 hours and daily while on heparin Continue to monitor H&H and platelets Preliminary review of pertinent patient information completed.  Jeani HawkingAnnie Penn clinical pharmacist will complete review during morning rounds to assess the patient and finalize treatment  regimen.  Loyola MastMidkiff, Radha Coggins Scarlett, RPH 05/17/2018,5:22 AM

## 2018-05-17 NOTE — Interval H&P Note (Signed)
History and Physical Interval Note:  05/17/2018 2:15 PM  [name]  has presented today for surgery, with the diagnosis of cp  The various methods of treatment have been discussed with the patient and family. After consideration of risks, benefits and other options for treatment, the patient has consented to  Procedure(s): LEFT HEART CATH AND CORONARY ANGIOGRAPHY (N/A) as a surgical intervention .  The patient's history has been reviewed, patient examined, no change in status, stable for surgery.  I have reviewed the patient's chart and labs.  Questions were answered to the patient's satisfaction.    Cath Lab Visit (complete for each Cath Lab visit)  Clinical Evaluation Leading to the Procedure:   ACS: Yes.    Non-ACS:    Anginal Classification: CCS III  Anti-ischemic medical therapy: Minimal Therapy (1 class of medications)  Non-Invasive Test Results: No non-invasive testing performed  Prior CABG: No previous CABG       Theron Aristaeter [facility] CenterJordanMD,FACC 05/17/2018 2:15 PM

## 2018-05-17 NOTE — ED Notes (Signed)
Pt still c/o of pain on lt side of shoulder  Scapula area.

## 2018-05-17 NOTE — ED Provider Notes (Signed)
[facility] EMERGENCY DEPARTMENT Provider Note   CSN: [id number] Arrival date & time: 05/17/18  4098     History   Chief Complaint Chief Complaint  Patient presents with  . Chest Pain    HPI [name] is a 42 y.o. female.  HPI 42 year old female comes in with chief complaint of chest pain. Patient has history of diabetes, hypertension, hyperlipidemia and extensive smoking history.  She reports that while she was about to get to her work at midnight, she started having left-sided chest pain that was radiating to her shoulder blades.  The pain is described as heaviness and pressure type pain.  Patient also had associated nausea, shortness of breath and diaphoresis.  Patient has no history of similar pain in the past.  She has history of GERD, however this pain is different than that.  She reports family history of CAD, patient's mother passed away in her 40s with a heart attack.    Past Medical History:  Diagnosis Date  . Asthma    no inhaler  . Chronic abdominal pain   . Depression   . Diabetes mellitus    on insulin during this pregnancy  . GERD (gastroesophageal reflux disease)   . Hyperlipidemia   . Hypertension   . Migraine headache     Patient Active Problem List   Diagnosis Date Noted  . Chest pain 05/17/2018    Past Surgical History:  Procedure Laterality Date  . CESAREAN SECTION WITH BILATERAL TUBAL LIGATION  07/18/2012   Procedure: CESAREAN SECTION WITH BILATERAL TUBAL LIGATION;  Surgeon: Mickel Baas, MD;  Location: [facility];  Service: Obstetrics;  Laterality: Bilateral;  . CHOLECYSTECTOMY    . WISDOM TOOTH EXTRACTION       OB History    Gravida  3   Para  2   Term  2   Preterm  0   AB  1   Living  2     SAB  1   TAB  0   Ectopic  0   Multiple  0   Live Births  2            Home Medications    Prior to Admission medications   Medication Sig Start Date End Date Taking? Authorizing Provider  acetaminophen (TYLENOL)  500 MG tablet Take 500 mg by mouth every 6 (six) hours as needed for mild pain.    [provider]  buPROPion (WELLBUTRIN SR) 150 MG 12 hr tablet Take 150 mg by mouth 2 (two) times daily.    [provider]  EPINEPHrine 0.3 mg/0.3 mL IJ SOAJ injection Inject 0.3 mg into the muscle once as needed (allergic reaction).  04/09/13   [provider]  FLUoxetine (PROZAC) 20 MG capsule Take 20 mg by mouth daily. 11/27/14 08/20/17  [provider]  ibuprofen (ADVIL,MOTRIN) 200 MG tablet Take 800 mg by mouth every 8 (eight) hours as needed. pain    [provider]  lisinopril (PRINIVIL,ZESTRIL) 5 MG tablet Take 5 mg by mouth daily.    [provider]  metFORMIN (GLUCOPHAGE-XR) 500 MG 24 hr tablet Take 500 mg by mouth 2 (two) times daily.    [provider]  omeprazole (PRILOSEC) 40 MG capsule Take 40 mg by mouth 2 (two) times daily.    [provider]  ondansetron (ZOFRAN ODT) 4 MG disintegrating tablet Take 1 tablet (4 mg total) by mouth every 8 (eight) hours as needed for nausea or vomiting. 08/20/17  Rancour, Jeannett SeniorStephen, MD  simvastatin (ZOCOR) 20 MG tablet Take 20 mg by mouth daily.    [provider]  topiramate (TOPAMAX) 200 MG tablet Take 200 mg by mouth 2 (two) times daily.    [provider]  lisinopril-hydrochlorothiazide (PRINZIDE,ZESTORETIC) 20-12.5 MG per tablet Take 1 tablet by mouth daily.    12/09/11  [provider]  metoprolol succinate (TOPROL-XL) 50 MG 24 hr tablet Take 50 mg by mouth daily. Take with or immediately following a meal.  12/09/11  [provider]  promethazine (PHENERGAN) 25 MG tablet Take 1 tablet (25 mg total) by mouth every 6 (six) hours as needed for nausea. 11/08/11 01/22/15  Samuel JesterMcManus, Kathleen, DO  trazodone (DESYREL) 300 MG tablet Take 300 mg by mouth at bedtime.    12/09/11  [provider]    Family History Family History  Problem Relation Age of Onset  . Heart  disease Mother   . COPD Mother   . Hypertension Mother   . Hyperlipidemia Mother   . Hearing loss Maternal Grandmother     Social History Social History   Tobacco Use  . Smoking status: Current Every Day Smoker    Packs/day: 0.50    Types: Cigarettes  . Smokeless tobacco: Never Used  Substance Use Topics  . Alcohol use: No  . Drug use: No     Allergies   Imitrex [sumatriptan base]; Levofloxacin; Shellfish allergy; Sulfa antibiotics; Tequin; and Betadine [povidone iodine]   Review of Systems Review of Systems  Constitutional: Positive for activity change and diaphoresis.  Respiratory: Positive for shortness of breath.   Cardiovascular: Positive for chest pain.  Gastrointestinal: Positive for nausea.  Neurological: Negative for syncope.  All other systems reviewed and are negative.    Physical Exam Updated Vital Signs BP (!) 138/94   Pulse (!) 56   Temp 98.1 F (36.7 C) (Oral)   Resp 19   Ht 5\' 9"  (1.753 m)   Wt 97.1 kg   LMP 05/17/2018   SpO2 100%   BMI 31.60 kg/m   Physical Exam  Constitutional: She is oriented to person, place, and time. She appears well-developed.  HENT:  Head: Normocephalic and atraumatic.  Eyes: EOM are normal.  Neck: Normal range of motion. Neck supple.  Cardiovascular: Normal rate, regular rhythm, intact distal pulses and normal pulses.  Pulmonary/Chest: Effort normal.  Abdominal: Bowel sounds are normal.  Neurological: She is alert and oriented to person, place, and time.  Skin: Skin is warm and dry.  Nursing note and vitals reviewed.    ED Treatments / Results  Labs (all labs ordered are listed, but only abnormal results are displayed) Labs Reviewed  BASIC METABOLIC PANEL - Abnormal; Notable for the following components:      Result Value   Glucose, Bld 116 (*)    Calcium 8.8 (*)    All other components within normal limits  CBC - Abnormal; Notable for the following components:   RBC 3.84 (*)    Hemoglobin 10.1 (*)      HCT 32.8 (*)    RDW 15.9 (*)    All other components within normal limits  TROPONIN I  TROPONIN I  I-STAT TROPONIN, ED  I-STAT BETA HCG BLOOD, ED ([facility], [facility], [facility] ONLY)    EKG EKG Interpretation  Date/Time:  Wednesday May 17 2018 00:42:47 EDT Ventricular Rate:  72 PR Interval:    QRS Duration: 80 QT Interval:  377 QTC Calculation: 413 R Axis:   57 Text  Interpretation:  Sinus rhythm Low voltage, precordial leads No acute changes Nonspecific ST abnormality No significant change since last tracing Confirmed by Derwood Kaplan [phone]) on 05/17/2018 1:27:03 AM    EKG Interpretation  Date/Time:  Wednesday May 17 2018 04:00:04 EDT Ventricular Rate:  57 PR Interval:    QRS Duration: 85 QT Interval:  416 QTC Calculation: 405 R Axis:   66 Text Interpretation:  Sinus rhythm ST elevation in the inferior and lateral leads no reciprocal depression ST elevation slightly more pronounced Confirmed by Derwood Kaplan ([id number]) on 05/17/2018 5:04:30 AM        Radiology Dg Chest 2 View  Result Date: 05/17/2018 CLINICAL DATA:  42 year old female with chest pain. EXAM: CHEST - 2 VIEW COMPARISON:  Chest radiograph dated 12/25/2016 FINDINGS: The heart size and mediastinal contours are within normal limits. Both lungs are clear. The visualized skeletal structures are unremarkable. IMPRESSION: No active cardiopulmonary disease. Electronically Signed   By: Elgie Collard M.D.   On: 05/17/2018 01:27    Procedures Procedures (including critical care time)  CRITICAL CARE Performed by: Worthington Cruzan   Total critical care time: 36 minutes  Critical care time was exclusive of separately billable procedures and treating other patients.  Critical care was necessary to treat or prevent imminent or life-threatening deterioration.  Critical care was time spent personally by me on the following activities: development of treatment plan with patient and/or surrogate as well as nursing, discussions  with consultants, evaluation of patient's response to treatment, examination of patient, obtaining history from patient or surrogate, ordering and performing treatments and interventions, ordering and review of laboratory studies, ordering and review of radiographic studies, pulse oximetry and re-evaluation of patient's condition.   Medications Ordered in ED Medications  nitroGLYCERIN (NITROSTAT) SL tablet 0.4 mg (0.4 mg Sublingual Given 05/17/18 0319)  pantoprazole (PROTONIX) EC tablet 40 mg (has no administration in time range)  HYDROmorphone (DILAUDID) injection 1 mg (has no administration in time range)  aspirin chewable tablet 324 mg (324 mg Oral Given 05/17/18 0310)  ondansetron (ZOFRAN) injection 4 mg (4 mg Intravenous Given 05/17/18 0339)  acetaminophen (TYLENOL) tablet 650 mg (650 mg Oral Given 05/17/18 0338)     Initial Impression / Assessment and Plan / ED Course  I have reviewed the triage vital signs and the nursing notes.  Pertinent labs & imaging results that were available during my care of the patient were reviewed by me and considered in my medical decision making (see chart for details).  Clinical Course as of May 17 504  Wed May 17, 2018  0335 Patient's chest pain resolved after nitroglycerin. She however continues to have the scapular tenderness. Cardiology has been consulted with concerns for unstable angina.  Admission disposal will be based on the recommendations.   [AN]  [id number] I spoke with Dr. Andrey Campanile, cardiology. We discussed patient's presenting symptoms that are typical along with her response to nitroglycerin.  We also discussed persistent pain in her shoulder blade, with resolution of the anterior chest pain and the EKG is having subtle changes.  Dr. Andrey Campanile recommends that patient stay at any pain hospital at this time. We will start her on heparin. I have consulted hospitalist for admission and they have accepted. Patient has been informed to let us know if  her pain gets worse.   [AN]    Clinical Course User Index [AN] Derwood Kaplan, MD    42 year old female comes in with chief complaint of chest pain.  Patient's chest pain sounds  typical, with it being left-sided and radiating to the shoulder blade.  She also has concerning constitutional such as nausea, diaphoresis and shortness of breath associated with this pain.  As far as patient's risk factors are concerned, she has hypertension, hyperlipidemia, diabetes, she is obese and has extensive smoking history.  She also has family history of CAD.  We considered GERD in the differential diagnosis along with cholelithiasis, however this pain does not appear to be GERD type to the patient and she has no abdominal tenderness.  Esophageal spasms also considered along with PUD and pancreatitis.  EKG is reassuring. We will give her nitroglycerin and reassess  Final Clinical Impressions(s) / ED Diagnoses   Final diagnoses:  Unstable angina [facility])    ED Discharge Orders    None       Derwood Kaplan, MD 05/17/18 [phone]

## 2018-05-17 NOTE — Progress Notes (Signed)
TR band remove per [facility] protocol no signs of complications or bleeding. All discharge paper work and teaching have been completed

## 2018-05-17 NOTE — Discharge Summary (Signed)
Discharge Summary    Patient ID: [name],  MRN: [id number], DOB/AGE: Oct 15, 1975 42 y.o.  Admit date: 05/17/2018 Discharge date: 05/17/2018  Primary Care Provider: Chesley Noon Primary Cardiologist: Dr. Domenic Polite   Discharge Diagnoses    Active Problems:   Chest pain   Mixed hyperlipidemia   Controlled diabetes mellitus ([place])   Tobacco abuse   Essential hypertension   Unstable angina (HCC)   Allergies Allergies  Allergen Reactions  . Imitrex [Sumatriptan Base] Palpitations  . Levofloxacin Anaphylaxis  . Shellfish Allergy Anaphylaxis and Rash  . Sulfa Antibiotics Anaphylaxis and Rash  . Tequin Anaphylaxis  . Betadine [Povidone Iodine] Rash and Other (See Comments)    Blistering of skin    Diagnostic Studies/Procedures    Cath: 05/17/18   The left ventricular systolic function is normal.  LV end diastolic pressure is normal.  The left ventricular ejection fraction is 55-65% by visual estimate.   1. Normal coronary anatomy 2. Normal LV function 3. Normal LVEDP  No indication for antiplatelet therapy at this time. _____________   History of Present Illness     [name] presented to [facility] ED during the early morning hours of 05/17/2018 for evaluation of chest pain which occurred while at work. Reported that over the past few days she had been experiencing increased fatigue but starting the night prior to admission, she developed a pressure along her left pectoral region which radiated down her arm and into her shoulder blades bilaterally. Described this as something sitting on her chest and reported symptoms did not resemble her prior episodes of GERD. She experienced associated nausea and diaphoresis with the event. Upon arrival to the ED, she was given SL NTG with improvement in her symptoms. Reported still having pain along her shoulder blades but this had overall improved at the time of interview.   She reported having a stress test 10+  years ago which was normal at that time. Also had HTN, HLD, and Type 2 DM which was followed by her PCP. Her mother died from at MI at age 30. She also smoked 0.5 ppd and had done so for over 20 years but was trying to quit.   Initial labs showed WBC 4.9, Hgb 10.1 (baseline of ~ 11.0 in 2017 and 2018), platelets 272, Na+ 140, K+ 4.2, and creatinine 0.78.  Initial and delta troponin values have been negative with cyclic enzymes pending. FLP and Hgb A1c pending. CXR shows no active cardia pulmonary disease.  EKG shows NSR, HR 72, with up-sloping ST segment along the inferior and lateral leads which is similar to prior tracings dating back to 2016 Given her symptoms she was transferred to [facility] for further work up with cardiac cath.   Hospital Course     Underwent cardiac cath noted above with Dr. Martinique with normal coronary anatomy, normal LV function and normal LVEDP. EF noted at 55-65% on LV gram. Troponins remained negative. No further chest pain reported. Instructions/precautions given prior to discharge regarding radial cath site care.   [name] was seen by Dr. Martinique and determined stable for discharge home. Follow up in the office has been arranged. Medications are listed below.   _____________  Discharge Vitals Blood pressure 115/75, pulse 64, temperature 98.1 F (36.7 C), temperature source Oral, resp. rate 13, height _0  (1.753 m), weight 96.3 kg, last menstrual period 05/17/2018, SpO2 100 %.  Filed Weights   05/17/18 0038 05/17/18 1308  Weight: 97.1 kg  96.3 kg    Labs & Radiologic Studies    CBC Recent Labs    05/17/18 0109 05/17/18 0653  WBC 4.9 6.4  HGB 10.1* 10.0*  HCT 32.8* 33.1*  MCV 85.4 85.3  PLT 272 409   Basic Metabolic Panel Recent Labs    05/17/18 0109 05/17/18 0653  NA 140 140  K 4.2 3.8  CL 111 109  CO2 23 26  GLUCOSE 116* 98  BUN 13 10  CREATININE 0.78 0.75  CALCIUM 8.8* 8.9   Liver Function Tests Recent Labs    05/17/18 0653  AST  14*  ALT 15  ALKPHOS 47  BILITOT 0.6  PROT 6.9  ALBUMIN 3.7   No results for input(s): LIPASE, AMYLASE in the last 72 hours. Cardiac Enzymes Recent Labs    05/17/18 0357 05/17/18 0653 05/17/18 1157  TROPONINI <0.03 <0.03 <0.03   BNP Invalid input(s): POCBNP D-Dimer No results for input(s): DDIMER in the last 72 hours. Hemoglobin A1C Recent Labs    05/17/18 0653  HGBA1C 5.7*   Fasting Lipid Panel Recent Labs    05/17/18 0653  CHOL 162  HDL 49  LDLCALC 94  TRIG 97  CHOLHDL 3.3   Thyroid Function Tests No results for input(s): TSH, T4TOTAL, T3FREE, THYROIDAB in the last 72 hours.  Invalid input(s): FREET3 _____________  Dg Chest 2 View  Result Date: 05/17/2018 CLINICAL DATA:  43 year old female with chest pain. EXAM: CHEST - 2 VIEW COMPARISON:  Chest radiograph dated 12/25/2016 FINDINGS: The heart size and mediastinal contours are within normal limits. Both lungs are clear. The visualized skeletal structures are unremarkable. IMPRESSION: No active cardiopulmonary disease. Electronically Signed   By: Anner Crete M.D.   On: 05/17/2018 01:27   Disposition   Pt is being discharged home today in good condition.  Follow-up Plans & Appointments    Follow-up Information    Chesley Noon, MD Follow up.   Specialty:  Family Medicine Why:  within 2-3 weeks.  Contact information: [place] [address] [phone]          Discharge Instructions    Call MD for:  redness, tenderness, or signs of infection (pain, swelling, redness, odor or green/yellow discharge around incision site)   Complete by:  As directed    Diet - low sodium heart healthy   Complete by:  As directed    Discharge instructions   Complete by:  As directed    Radial Site Care Refer to this sheet in the next few weeks. These instructions provide you with information on caring for yourself after your procedure. Your caregiver may also give you more specific  instructions. Your treatment has been planned according to current medical practices, but problems sometimes occur. Call your caregiver if you have any problems or questions after your procedure. HOME CARE INSTRUCTIONS You may shower the day after the procedure.Remove the bandage (dressing) and gently wash the site with plain soap and water.Gently pat the site dry.  Do not apply powder or lotion to the site.  Do not submerge the affected site in water for 3 to 5 days.  Inspect the site at least twice daily.  Do not flex or bend the affected arm for 24 hours.  No lifting over 5 pounds (2.3 kg) for 5 days after your procedure.  Do not drive home if you are discharged the same day of the procedure. Have someone else drive you.  You may drive 24 hours after the  procedure unless otherwise instructed by your caregiver.  What to expect: Any bruising will usually fade within 1 to 2 weeks.  Blood that collects in the tissue (hematoma) may be painful to the touch. It should usually decrease in size and tenderness within 1 to 2 weeks.  SEEK IMMEDIATE MEDICAL CARE IF: You have unusual pain at the radial site.  You have redness, warmth, swelling, or pain at the radial site.  You have drainage (other than a small amount of blood on the dressing).  You have chills.  You have a fever or persistent symptoms for more than 72 hours.  You have a fever and your symptoms suddenly get worse.  Your arm becomes pale, cool, tingly, or numb.  You have heavy bleeding from the site. Hold pressure on the site.   Please do not restart your metformin until 05/19/18!!!!   Increase activity slowly   Complete by:  As directed        Discharge Medications     Medication List    TAKE these medications   acetaminophen 500 MG tablet Commonly known as:  TYLENOL Take 500 mg by mouth every 6 (six) hours as needed for mild pain.   amoxicillin-clavulanate 875-125 MG tablet Commonly known as:  AUGMENTIN Take 1 tablet  by mouth 2 (two) times daily.   buPROPion 150 MG 12 hr tablet Commonly known as:  WELLBUTRIN SR Take 150 mg by mouth 2 (two) times daily.   cetirizine 10 MG tablet Commonly known as:  ZYRTEC Take 1 tablet by mouth daily.   EPINEPHrine 0.3 mg/0.3 mL Soaj injection Commonly known as:  EPI-PEN Inject 0.3 mg into the muscle once as needed (allergic reaction).   FLUoxetine 20 MG capsule Commonly known as:  PROZAC Take 40 mg by mouth daily.   fluticasone 50 MCG/ACT nasal spray Commonly known as:  FLONASE Place 2 sprays into both nostrils 2 (two) times daily.   hydrocortisone 25 MG suppository Commonly known as:  ANUSOL-HC Place 1 suppository rectally as needed.   ibuprofen 200 MG tablet Commonly known as:  ADVIL,MOTRIN Take 800 mg by mouth every 8 (eight) hours as needed. pain   lisinopril 40 MG tablet Commonly known as:  PRINIVIL,ZESTRIL Take 1 tablet by mouth daily.   metformin 500 MG (OSM) 24 hr tablet Commonly known as:  FORTAMET Take 2 tablets by mouth 2 (two) times daily.   Nicotine 21-14-7 MG/24HR Kit Apply 1 patch topically daily.   omeprazole 40 MG capsule Commonly known as:  PRILOSEC Take 40 mg by mouth 2 (two) times daily.   ondansetron 4 MG disintegrating tablet Commonly known as:  ZOFRAN-ODT Take 1 tablet (4 mg total) by mouth every 8 (eight) hours as needed for nausea or vomiting.   simvastatin 20 MG tablet Commonly known as:  ZOCOR Take 20 mg by mouth daily.   topiramate 200 MG tablet Commonly known as:  TOPAMAX Take 200 mg by mouth 2 (two) times daily.       Acute coronary syndrome (MI, NSTEMI, STEMI, etc) this admission?: No.     Outstanding Labs/Studies   N/a   Duration of Discharge Encounter   Greater than 30 minutes including physician time.  Signed, Reino Bellis NP-C 05/17/2018, 4:38 PM

## 2018-05-17 NOTE — Progress Notes (Signed)
Pt [facility] with [facility] . Report previously called to Dois DavenportSandra, [place]. Pt stable with no apparent distress.

## 2018-05-18 LAB — HIV ANTIBODY (ROUTINE TESTING W REFLEX): HIV SCREEN 4TH GENERATION: NONREACTIVE

## 2018-08-01 ENCOUNTER — Emergency Department (HOSPITAL_COMMUNITY): Payer: BLUE CROSS/BLUE SHIELD

## 2018-08-01 ENCOUNTER — Other Ambulatory Visit: Payer: Self-pay

## 2018-08-01 ENCOUNTER — Encounter (HOSPITAL_COMMUNITY): Payer: Self-pay | Admitting: Emergency Medicine

## 2018-08-01 ENCOUNTER — Emergency Department (HOSPITAL_COMMUNITY)
Admission: EM | Admit: 2018-08-01 | Discharge: 2018-08-01 | Disposition: A | Discharge status: Home / Self Care | Payer: BLUE CROSS/BLUE SHIELD | Attending: Emergency Medicine | Admitting: Emergency Medicine

## 2018-08-01 DIAGNOSIS — M70831 Other soft tissue disorders related to use, overuse and pressure, right forearm: Secondary | ICD-10-CM | POA: Diagnosis not present

## 2018-08-01 DIAGNOSIS — Z79899 Other long term (current) drug therapy: Secondary | ICD-10-CM | POA: Diagnosis not present

## 2018-08-01 DIAGNOSIS — J45909 Unspecified asthma, uncomplicated: Secondary | ICD-10-CM | POA: Diagnosis not present

## 2018-08-01 DIAGNOSIS — F17210 Nicotine dependence, cigarettes, uncomplicated: Secondary | ICD-10-CM | POA: Insufficient documentation

## 2018-08-01 DIAGNOSIS — Y939 Activity, unspecified: Secondary | ICD-10-CM | POA: Diagnosis not present

## 2018-08-01 DIAGNOSIS — I10 Essential (primary) hypertension: Secondary | ICD-10-CM | POA: Insufficient documentation

## 2018-08-01 DIAGNOSIS — E119 Type 2 diabetes mellitus without complications: Secondary | ICD-10-CM | POA: Diagnosis not present

## 2018-08-01 DIAGNOSIS — M779 Enthesopathy, unspecified: Secondary | ICD-10-CM

## 2018-08-01 DIAGNOSIS — Z7984 Long term (current) use of oral hypoglycemic drugs: Secondary | ICD-10-CM | POA: Insufficient documentation

## 2018-08-01 DIAGNOSIS — M25521 Pain in right elbow: Secondary | ICD-10-CM | POA: Diagnosis present

## 2018-08-01 MED ORDER — DICLOFENAC SODIUM 1 % TD GEL: 2.0000 g | Freq: Four times a day (QID) | TRANSDERMAL | 1 refills | Status: AC

## 2018-08-01 NOTE — Discharge Instructions (Signed)
Return if any problems.

## 2018-08-01 NOTE — ED Triage Notes (Signed)
Patient complaining of right elbow pain x 2 days. States she uses repetitive motion with that elbow at work. Denies any other injury.

## 2018-08-01 NOTE — ED Provider Notes (Signed)
[facility] EMERGENCY DEPARTMENT Provider Note   CSN: [id number] Arrival date & time: 08/01/18  1215     History   Chief Complaint Chief Complaint  Patient presents with  . Elbow Pain    HPI [name] is a 42 y.o. female.  The history is provided by the patient. No language interpreter was used.  Arm Injury   This is a new problem. The problem occurs constantly. The problem has been gradually worsening. The pain is present in the right arm. The quality of the pain is described as aching. The pain is moderate. Pertinent negatives include no numbness. She has tried nothing for the symptoms. The treatment provided no relief. There has been no history of extremity trauma.    Past Medical History:  Diagnosis Date  . Asthma    no inhaler  . Chronic abdominal pain   . Depression   . Diabetes mellitus    on insulin during this pregnancy  . GERD (gastroesophageal reflux disease)   . Hyperlipidemia   . Hypertension   . Migraine headache   . Unstable angina [facility])     Patient Active Problem List   Diagnosis Date Noted  . Chest pain 05/17/2018  . Mixed hyperlipidemia 05/17/2018  . Controlled diabetes mellitus ([place]) 05/17/2018  . Tobacco abuse 05/17/2018  . Essential hypertension 05/17/2018  . Unstable angina [facility])     Past Surgical History:  Procedure Laterality Date  . CESAREAN SECTION WITH BILATERAL TUBAL LIGATION  07/18/2012   Procedure: CESAREAN SECTION WITH BILATERAL TUBAL LIGATION;  Surgeon: Sharene Butters, MD;  Location: [facility];  Service: Obstetrics;  Laterality: Bilateral;  . CHOLECYSTECTOMY    . LEFT HEART CATH AND CORONARY ANGIOGRAPHY N/A 05/17/2018   Procedure: LEFT HEART CATH AND CORONARY ANGIOGRAPHY;  Surgeon: Martinique, Peter M, MD;  Location: [facility];  Service: Cardiovascular;  Laterality: N/A;  . WISDOM TOOTH EXTRACTION       OB History    Gravida  3   Para  2   Term  2   Preterm  0   AB  1   Living  2     SAB  1   TAB  0    Ectopic  0   Multiple  0   Live Births  2            Home Medications    Prior to Admission medications   Medication Sig Start Date End Date Taking? Authorizing Provider  acetaminophen (TYLENOL) 500 MG tablet Take 500 mg by mouth every 6 (six) hours as needed for mild pain.    [provider]  amoxicillin-clavulanate (AUGMENTIN) 875-125 MG tablet Take 1 tablet by mouth 2 (two) times daily. 04/25/18   [provider]  buPROPion (WELLBUTRIN SR) 150 MG 12 hr tablet Take 150 mg by mouth 2 (two) times daily.    [provider]  cetirizine (ZYRTEC) 10 MG tablet Take 1 tablet by mouth daily.    [provider]  EPINEPHrine 0.3 mg/0.3 mL IJ SOAJ injection Inject 0.3 mg into the muscle once as needed (allergic reaction).  04/09/13   [provider]  FLUoxetine (PROZAC) 20 MG capsule Take 40 mg by mouth daily.  11/27/14 05/17/18  [provider]  fluticasone (FLONASE) 50 MCG/ACT nasal spray Place 2 sprays into both nostrils 2 (two) times daily. 04/03/14   [provider]  hydrocortisone (ANUSOL-HC) 25 MG suppository Place 1 suppository rectally as needed. 10/03/17 10/03/18  [provider]  ibuprofen (ADVIL,MOTRIN) 200 MG tablet Take 800 mg by mouth every 8 (eight) hours as needed. pain    [provider]  lisinopril (PRINIVIL,ZESTRIL) 40 MG tablet Take 1 tablet by mouth daily. 04/01/18   [provider]  metformin (FORTAMET) 500 MG (OSM) 24 hr tablet Take 2 tablets by mouth 2 (two) times daily. 04/01/18   [provider]  Nicotine 21-14-7 MG/24HR KIT Apply 1 patch topically daily. 04/12/18   [provider]  omeprazole (PRILOSEC) 40 MG capsule Take 40 mg by mouth 2 (two) times daily.    [provider]  ondansetron (ZOFRAN ODT) 4 MG disintegrating tablet Take 1 tablet (4 mg total) by mouth every 8 (eight) hours as needed for nausea or vomiting. 08/20/17   Rancour, Annie Main, MD  simvastatin  (ZOCOR) 20 MG tablet Take 20 mg by mouth daily.    [provider]  topiramate (TOPAMAX) 200 MG tablet Take 200 mg by mouth 2 (two) times daily.    [provider]    Family History Family History  Problem Relation Age of Onset  . Heart disease Mother   . COPD Mother   . Hypertension Mother   . Hyperlipidemia Mother   . Hearing loss Maternal Grandmother     Social History Social History   Tobacco Use  . Smoking status: Current Every Day Smoker    Packs/day: 0.50    Types: Cigarettes  . Smokeless tobacco: Never Used  Substance Use Topics  . Alcohol use: No  . Drug use: No     Allergies   Imitrex [sumatriptan base]; Levofloxacin; Shellfish allergy; Sulfa antibiotics; Tequin; and Betadine [povidone iodine]   Review of Systems Review of Systems  Neurological: Negative for numbness.  All other systems reviewed and are negative.    Physical Exam Updated Vital Signs BP 128/63 (BP Location: Right Arm)   Pulse 87   Temp 98.2 F (36.8 C) (Oral)   Resp 16   Ht _0  (1.753 m)   Wt 98.9 kg   LMP 07/18/2018   SpO2 99%   BMI 32.19 kg/m   Physical Exam  Constitutional: She appears well-developed and well-nourished.  HENT:  Head: Normocephalic.  Eyes: Pupils are equal, round, and reactive to light.  Neck: Normal range of motion.  Cardiovascular: Normal rate.  Pulmonary/Chest: Effort normal.  Musculoskeletal: She exhibits tenderness.  Neurological: She is alert.  Skin: Skin is warm.  Psychiatric: She has a normal mood and affect.  Nursing note and vitals reviewed.    ED Treatments / Results  Labs (all labs ordered are listed, but only abnormal results are displayed) Labs Reviewed - No data to display  EKG None  Radiology Dg Elbow Complete Right  Result Date: 08/01/2018 CLINICAL DATA:  42 year old female with right elbow pain for 4 months, no known injury. EXAM: RIGHT ELBOW - COMPLETE 3+ VIEW COMPARISON:  None. FINDINGS: Bone  mineralization is within normal limits. There is no evidence of fracture, dislocation, or joint effusion. There is no evidence of arthropathy or other focal bone abnormality. Soft tissues are unremarkable. IMPRESSION: Negative. Electronically Signed   By: Genevie Ann M.D.   On: 08/01/2018 12:39    Procedures Procedures (including critical care time)  Medications Ordered in ED Medications - No data to display   Initial Impression / Assessment and Plan / ED Course  I have reviewed the triage vital signs and the nursing notes.  Pertinent labs & imaging results that were available  during my care of the patient were reviewed by me and considered in my medical decision making (see chart for details).     Tender right elbow medial area, from, nv and ns intact  Pt placed in a sling.   Final Clinical Impressions(s) / ED Diagnoses   Final diagnoses:  Tendonitis    ED Discharge Orders         Ordered    diclofenac sodium (VOLTAREN) 1 % GEL  4 times daily     08/01/18 1427        Follow up with the Orthopaedist for recheck if pain persist    Sidney Ace 08/02/18 1335    Milton Ferguson, MD 08/03/18 1319

## 2018-11-12 ENCOUNTER — Emergency Department (HOSPITAL_COMMUNITY): Payer: Self-pay

## 2018-11-12 ENCOUNTER — Encounter (HOSPITAL_COMMUNITY): Payer: Self-pay | Admitting: Emergency Medicine

## 2018-11-12 ENCOUNTER — Emergency Department (HOSPITAL_COMMUNITY)
Admission: EM | Admit: 2018-11-12 | Discharge: 2018-11-12 | Disposition: A | Discharge status: Home / Self Care | Payer: Self-pay | Attending: Emergency Medicine | Admitting: Emergency Medicine

## 2018-11-12 DIAGNOSIS — R1030 Lower abdominal pain, unspecified: Secondary | ICD-10-CM | POA: Insufficient documentation

## 2018-11-12 DIAGNOSIS — F17210 Nicotine dependence, cigarettes, uncomplicated: Secondary | ICD-10-CM | POA: Insufficient documentation

## 2018-11-12 DIAGNOSIS — R102 Pelvic and perineal pain: Secondary | ICD-10-CM | POA: Insufficient documentation

## 2018-11-12 DIAGNOSIS — I10 Essential (primary) hypertension: Secondary | ICD-10-CM | POA: Insufficient documentation

## 2018-11-12 DIAGNOSIS — Z79899 Other long term (current) drug therapy: Secondary | ICD-10-CM | POA: Insufficient documentation

## 2018-11-12 DIAGNOSIS — E119 Type 2 diabetes mellitus without complications: Secondary | ICD-10-CM | POA: Insufficient documentation

## 2018-11-12 DIAGNOSIS — R109 Unspecified abdominal pain: Secondary | ICD-10-CM

## 2018-11-12 LAB — CBC WITH DIFFERENTIAL/PLATELET
ABS IMMATURE GRANULOCYTES: 0.02 10*3/uL (ref 0.00–0.07)
Basophils Absolute: 0.1 10*3/uL (ref 0.0–0.1)
Basophils Relative: 1 %
Eosinophils Absolute: 0.5 10*3/uL (ref 0.0–0.5)
Eosinophils Relative: 6 %
HCT: 36 % (ref 36.0–46.0)
HEMOGLOBIN: 10.4 g/dL — AB (ref 12.0–15.0)
Immature Granulocytes: 0 %
LYMPHS PCT: 23 %
Lymphs Abs: 1.8 10*3/uL (ref 0.7–4.0)
MCH: 24.5 pg — AB (ref 26.0–34.0)
MCHC: 28.9 g/dL — ABNORMAL LOW (ref 30.0–36.0)
MCV: 84.7 fL (ref 80.0–100.0)
MONO ABS: 0.5 10*3/uL (ref 0.1–1.0)
MONOS PCT: 7 %
NEUTROS ABS: 5 10*3/uL (ref 1.7–7.7)
Neutrophils Relative %: 63 %
Platelets: 306 10*3/uL (ref 150–400)
RBC: 4.25 MIL/uL (ref 3.87–5.11)
RDW: 16.5 % — ABNORMAL HIGH (ref 11.5–15.5)
WBC: 7.8 10*3/uL (ref 4.0–10.5)
nRBC: 0 % (ref 0.0–0.2)

## 2018-11-12 LAB — COMPREHENSIVE METABOLIC PANEL
ALK PHOS: 54 U/L (ref 38–126)
ALT: 18 U/L (ref 0–44)
AST: 18 U/L (ref 15–41)
Albumin: 3.3 g/dL — ABNORMAL LOW (ref 3.5–5.0)
Anion gap: 11 (ref 5–15)
BUN: 13 mg/dL (ref 6–20)
CALCIUM: 8.8 mg/dL — AB (ref 8.9–10.3)
CO2: 21 mmol/L — AB (ref 22–32)
CREATININE: 0.74 mg/dL (ref 0.44–1.00)
Chloride: 104 mmol/L (ref 98–111)
Glucose, Bld: 138 mg/dL — ABNORMAL HIGH (ref 70–99)
Potassium: 4 mmol/L (ref 3.5–5.1)
Sodium: 136 mmol/L (ref 135–145)
Total Bilirubin: 0.5 mg/dL (ref 0.3–1.2)
Total Protein: 6.5 g/dL (ref 6.5–8.1)

## 2018-11-12 LAB — URINALYSIS, ROUTINE W REFLEX MICROSCOPIC
Bacteria, UA: NONE SEEN
Bilirubin Urine: NEGATIVE
GLUCOSE, UA: NEGATIVE mg/dL
Hgb urine dipstick: NEGATIVE
Ketones, ur: NEGATIVE mg/dL
Nitrite: NEGATIVE
PROTEIN: NEGATIVE mg/dL
Specific Gravity, Urine: 1.006 (ref 1.005–1.030)
pH: 6 (ref 5.0–8.0)

## 2018-11-12 LAB — LIPASE, BLOOD: LIPASE: 26 U/L (ref 11–51)

## 2018-11-12 LAB — PREGNANCY, URINE: PREG TEST UR: NEGATIVE

## 2018-11-12 MED ORDER — ONDANSETRON HCL 4 MG/2ML IJ SOLN
4.0000 mg | Freq: Once | INTRAMUSCULAR | Status: AC
  Administered 2018-11-12: 4 mg via INTRAVENOUS
  Filled 2018-11-12: qty 2

## 2018-11-12 MED ORDER — MORPHINE SULFATE (PF) 4 MG/ML IV SOLN
4.0000 mg | Freq: Once | INTRAVENOUS | Status: AC
  Administered 2018-11-12: 4 mg via INTRAVENOUS
  Filled 2018-11-12: qty 1

## 2018-11-12 MED ORDER — KETOROLAC TROMETHAMINE 30 MG/ML IJ SOLN
30.0000 mg | Freq: Once | INTRAMUSCULAR | Status: AC
  Administered 2018-11-12: 30 mg via INTRAVENOUS
  Filled 2018-11-12: qty 1

## 2018-11-12 MED ORDER — SODIUM CHLORIDE 0.9 % IV BOLUS
1000.0000 mL | Freq: Once | INTRAVENOUS | Status: AC
  Administered 2018-11-12: 1000 mL via INTRAVENOUS

## 2018-11-12 MED ORDER — ONDANSETRON HCL 4 MG PO TABS: 4.0000 mg | ORAL_TABLET | Freq: Three times a day (TID) | ORAL | 0 refills | Status: AC | PRN

## 2018-11-12 NOTE — Discharge Instructions (Addendum)
Please call and follow up with your doctor for further evaluation of your abdominal pain.  Take over the counter tylenol or ibuprofen as needed for pain.  Follow up with gastroenterologist as well.

## 2018-11-12 NOTE — ED Notes (Signed)
Patient verbalizes understanding of discharge instructions. Opportunity for questioning and answers were provided. Armband removed by staff, pt discharged from ED ambulatory.   

## 2018-11-12 NOTE — ED Notes (Signed)
Pain medication requested from PA at this time, pt states her right flank is still very painful, worse after the ultrasound

## 2018-11-12 NOTE — ED Triage Notes (Signed)
  Patient comes in with R flank pain that started yesterday.  Patient states she has been taking ibuprofen for pain that is on R lower abdomen and R lower back.  Patient has hx of kidney stones and states it feels like the same.  Patient last took ibuprofen at 0415.  N/V with last occurrence at 0530.

## 2018-11-12 NOTE — ED Provider Notes (Signed)
[facility] EMERGENCY DEPARTMENT Provider Note   CSN: [id number] Arrival date & time: 11/12/18  8938    History   Chief Complaint Chief Complaint  Patient presents with  . Abdominal Pain  . Flank Pain    HPI [name] is a 43 y.o. female.     The history is provided by the patient. No language interpreter was used.     43 year old female with history of diabetes, hypertension, chronic abdominal pain, kidney stones, presenting for evaluation of flank pain.  Patient reports his yesterday afternoon she has had recurrent pain to her lower abdomen radiates to her right flank.  Pain is sharp, shooting, waxing waning and nothing seems to make it better or worse.  She endorsed some mild nausea and vomiting when the pain is intense.  She denies having fever chills chest pain shortness of breath dysuria hematuria vaginal bleeding vaginal discharge or rash.  She felt pain is similar to prior kidney stone that she has had in the past.  She tries taking ibuprofen at home with some improvement.  Past Medical History:  Diagnosis Date  . Asthma    no inhaler  . Chronic abdominal pain   . Depression   . Diabetes mellitus    on insulin during this pregnancy  . GERD (gastroesophageal reflux disease)   . Hyperlipidemia   . Hypertension   . Migraine headache   . Unstable angina [facility])     Patient Active Problem List   Diagnosis Date Noted  . Chest pain 05/17/2018  . Mixed hyperlipidemia 05/17/2018  . Controlled diabetes mellitus ([place]) 05/17/2018  . Tobacco abuse 05/17/2018  . Essential hypertension 05/17/2018  . Unstable angina [facility])     Past Surgical History:  Procedure Laterality Date  . CESAREAN SECTION WITH BILATERAL TUBAL LIGATION  07/18/2012   Procedure: CESAREAN SECTION WITH BILATERAL TUBAL LIGATION;  Surgeon: Sharene Butters, MD;  Location: [facility];  Service: Obstetrics;  Laterality: Bilateral;  . CHOLECYSTECTOMY    . LEFT HEART CATH AND CORONARY  ANGIOGRAPHY N/A 05/17/2018   Procedure: LEFT HEART CATH AND CORONARY ANGIOGRAPHY;  Surgeon: Martinique, Peter M, MD;  Location: [facility];  Service: Cardiovascular;  Laterality: N/A;  . WISDOM TOOTH EXTRACTION       OB History    Gravida  3   Para  2   Term  2   Preterm  0   AB  1   Living  2     SAB  1   TAB  0   Ectopic  0   Multiple  0   Live Births  2            Home Medications    Prior to Admission medications   Medication Sig Start Date End Date Taking? Authorizing Provider  acetaminophen (TYLENOL) 500 MG tablet Take 500 mg by mouth every 6 (six) hours as needed for mild pain.    [provider]  amoxicillin-clavulanate (AUGMENTIN) 875-125 MG tablet Take 1 tablet by mouth 2 (two) times daily. 04/25/18   [provider]  buPROPion (WELLBUTRIN SR) 150 MG 12 hr tablet Take 150 mg by mouth 2 (two) times daily.    [provider]  cetirizine (ZYRTEC) 10 MG tablet Take 1 tablet by mouth daily.    [provider]  diclofenac sodium (VOLTAREN) 1 % GEL Apply 2 g topically 4 (four) times daily. 08/01/18   Fransico Meadow, PA-C  EPINEPHrine 0.3 mg/0.3  mL IJ SOAJ injection Inject 0.3 mg into the muscle once as needed (allergic reaction).  04/09/13   [provider]  FLUoxetine (PROZAC) 20 MG capsule Take 40 mg by mouth daily.  11/27/14 05/17/18  [provider]  fluticasone (FLONASE) 50 MCG/ACT nasal spray Place 2 sprays into both nostrils 2 (two) times daily. 04/03/14   [provider]  ibuprofen (ADVIL,MOTRIN) 200 MG tablet Take 800 mg by mouth every 8 (eight) hours as needed. pain    [provider]  lisinopril (PRINIVIL,ZESTRIL) 40 MG tablet Take 1 tablet by mouth daily. 04/01/18   [provider]  metformin (FORTAMET) 500 MG (OSM) 24 hr tablet Take 2 tablets by mouth 2 (two) times daily. 04/01/18   [provider]  Nicotine 21-14-7 MG/24HR KIT Apply 1 patch topically daily. 04/12/18    [provider]  omeprazole (PRILOSEC) 40 MG capsule Take 40 mg by mouth 2 (two) times daily.    [provider]  ondansetron (ZOFRAN ODT) 4 MG disintegrating tablet Take 1 tablet (4 mg total) by mouth every 8 (eight) hours as needed for nausea or vomiting. 08/20/17   Rancour, Annie Main, MD  simvastatin (ZOCOR) 20 MG tablet Take 20 mg by mouth daily.    [provider]  topiramate (TOPAMAX) 200 MG tablet Take 200 mg by mouth 2 (two) times daily.    [provider]    Family History Family History  Problem Relation Age of Onset  . Heart disease Mother   . COPD Mother   . Hypertension Mother   . Hyperlipidemia Mother   . Hearing loss Maternal Grandmother     Social History Social History   Tobacco Use  . Smoking status: Current Every Day Smoker    Packs/day: 0.50    Types: Cigarettes  . Smokeless tobacco: Never Used  Substance Use Topics  . Alcohol use: No  . Drug use: No     Allergies   Imitrex [sumatriptan base]; Levofloxacin; Shellfish allergy; Sulfa antibiotics; Tequin; and Betadine [povidone iodine]   Review of Systems Review of Systems  All other systems reviewed and are negative.    Physical Exam Updated Vital Signs BP (!) 171/94 (BP Location: Right Arm)   Pulse 74   Temp 98.3 F (36.8 C) (Oral)   Resp 18   Ht 5' 9"  (1.753 m)   Wt 101.2 kg   SpO2 100%   BMI 32.93 kg/m   Physical Exam Vitals signs and nursing note reviewed.  Constitutional:      General: She is not in acute distress.    Appearance: She is well-developed.  HENT:     Head: Atraumatic.  Eyes:     Conjunctiva/sclera: Conjunctivae normal.  Neck:     Musculoskeletal: Neck supple.  Cardiovascular:     Rate and Rhythm: Normal rate and regular rhythm.  Pulmonary:     Effort: Pulmonary effort is normal.     Breath sounds: Normal breath sounds.  Abdominal:     General: Abdomen is flat. Bowel sounds are normal.     Palpations: Abdomen is soft.      Tenderness: There is right CVA tenderness. There is no left CVA tenderness. Negative signs include Murphy's sign and McBurney's sign.     Hernia: No hernia is present.  Skin:    Findings: No rash.  Neurological:     Mental Status: She is alert.      ED Treatments / Results  Labs (all labs ordered are listed, but  only abnormal results are displayed) Labs Reviewed  CBC WITH DIFFERENTIAL/PLATELET - Abnormal; Notable for the following components:      Result Value   Hemoglobin 10.4 (*)    MCH 24.5 (*)    MCHC 28.9 (*)    RDW 16.5 (*)    All other components within normal limits  COMPREHENSIVE METABOLIC PANEL - Abnormal; Notable for the following components:   CO2 21 (*)    Glucose, Bld 138 (*)    Calcium 8.8 (*)    Albumin 3.3 (*)    All other components within normal limits  URINALYSIS, ROUTINE W REFLEX MICROSCOPIC - Abnormal; Notable for the following components:   Color, Urine STRAW (*)    APPearance HAZY (*)    Leukocytes,Ua TRACE (*)    All other components within normal limits  LIPASE, BLOOD  PREGNANCY, URINE    EKG None  Radiology US Renal  Result Date: 11/12/2018 CLINICAL DATA:  Right flank pain EXAM: RENAL / URINARY TRACT ULTRASOUND COMPLETE COMPARISON:  CT abdomen pelvis 08/20/2017 FINDINGS: Right Kidney: Renal measurements: 11.7 x 5.9 x 5.2 cm = volume: 186 mL. Mild fullness of the renal pelvis without significant hydronephrosis. No renal mass. Normal cortical echogenicity. Left Kidney: Renal measurements: 12.1 x 6.2 x 6.1 cm = volume: 241 mL. Echogenicity within normal limits. No mass or hydronephrosis visualized. Bladder: Appears normal for degree of bladder distention. Bilateral ureteral jets. IMPRESSION: Normal renal size. Mild fullness right renal pelvis without significant hydronephrosis. Electronically Signed   By: Franchot Gallo M.D.   On: 11/12/2018 08:29    Procedures Procedures (including critical care time)  Medications Ordered in ED Medications    morphine 4 MG/ML injection 4 mg (4 mg Intravenous Given 11/12/18 0728)  ondansetron (ZOFRAN) injection 4 mg (4 mg Intravenous Given 11/12/18 0727)  sodium chloride 0.9 % bolus 1,000 mL (0 mLs Intravenous Stopped 11/12/18 0758)  ketorolac (TORADOL) 30 MG/ML injection 30 mg (30 mg Intravenous Given 11/12/18 0834)     Initial Impression / Assessment and Plan / ED Course  I have reviewed the triage vital signs and the nursing notes.  Pertinent labs & imaging results that were available during my care of the patient were reviewed by me and considered in my medical decision making (see chart for details).        BP (!) 171/94 (BP Location: Right Arm)   Pulse 74   Temp 98.3 F (36.8 C) (Oral)   Resp 18   Ht 5' 9"  (1.753 m)   Wt 101.2 kg   SpO2 100%   BMI 32.93 kg/m  Patient noted to be hypertensive in the emergency department.  No signs of hypertensive urgency.  Discussed with patient the need for close follow-up and management by their primary care physician.    Final Clinical Impressions(s) / ED Diagnoses   Final diagnoses:  Recurrent abdominal pain    ED Discharge Orders         Ordered    ondansetron (ZOFRAN) 4 MG tablet  Every 8 hours PRN     11/12/18 0906         6:47 AM Patient with history of kidney stones and chronic abdominal pain here with pain to her right flank and abdomen.  On exam, she does have some right CVA tenderness but no significant abdominal discomfort.  I have reviewed her prior images, and the past few CT scan shows no acute finding.  Plan to obtain renal ultrasound.  9:07 AM UA without  any blood any signs of infection.  Pregnancy test is negative, normal WBC, hemoglobin is 10.4, at baseline, electrolyte panels are reassuring, normal lipase, renal ultrasound unremarkable.  Patient is mildly hypertensive, will recheck vital sign however patient will need to follow-up with PCP for further management of her condition.  GI specialist given as well.   Otherwise, low suspicion for acute emergent medical condition during this visit.  Return precaution discussed.   Domenic Moras, PA-C 11/12/18 0908    Ward, Delice Bison, DO 11/12/18 2302

## 2018-11-12 NOTE — ED Notes (Signed)
Patient transported to Ultrasound 

## 2023-04-03 ENCOUNTER — Other Ambulatory Visit: Payer: Self-pay | Admitting: Internal Medicine

## 2023-04-03 DIAGNOSIS — I10 Essential (primary) hypertension: Secondary | ICD-10-CM

## 2023-04-07 ENCOUNTER — Other Ambulatory Visit: Payer: Self-pay | Admitting: Internal Medicine

## 2023-04-07 DIAGNOSIS — E1165 Type 2 diabetes mellitus with hyperglycemia: Secondary | ICD-10-CM
